# Patient Record
Sex: Male | Born: 1950
Health system: Southern US, Community
[De-identification: ages and names within clinical notes are randomized; demographics above are authoritative.]

## PROBLEM LIST (undated history)

## (undated) DIAGNOSIS — M549 Dorsalgia, unspecified: Secondary | ICD-10-CM

## (undated) DIAGNOSIS — E669 Obesity, unspecified: Secondary | ICD-10-CM

## (undated) DIAGNOSIS — M545 Low back pain, unspecified: Secondary | ICD-10-CM

## (undated) DIAGNOSIS — I10 Essential (primary) hypertension: Secondary | ICD-10-CM

## (undated) DIAGNOSIS — M169 Osteoarthritis of hip, unspecified: Secondary | ICD-10-CM

## (undated) DIAGNOSIS — N401 Enlarged prostate with lower urinary tract symptoms: Secondary | ICD-10-CM

## (undated) DIAGNOSIS — K635 Polyp of colon: Secondary | ICD-10-CM

## (undated) DIAGNOSIS — J61 Pneumoconiosis due to asbestos and other mineral fibers: Secondary | ICD-10-CM

## (undated) DIAGNOSIS — M25569 Pain in unspecified knee: Secondary | ICD-10-CM

## (undated) DIAGNOSIS — M1990 Unspecified osteoarthritis, unspecified site: Secondary | ICD-10-CM

## (undated) DIAGNOSIS — S8292XA Unspecified fracture of left lower leg, initial encounter for closed fracture: Secondary | ICD-10-CM

## (undated) DIAGNOSIS — E119 Type 2 diabetes mellitus without complications: Secondary | ICD-10-CM

## (undated) DIAGNOSIS — R351 Nocturia: Secondary | ICD-10-CM

## (undated) DIAGNOSIS — M1710 Unilateral primary osteoarthritis, unspecified knee: Secondary | ICD-10-CM

## (undated) DIAGNOSIS — M179 Osteoarthritis of knee, unspecified: Secondary | ICD-10-CM

## (undated) DIAGNOSIS — E785 Hyperlipidemia, unspecified: Secondary | ICD-10-CM

## (undated) HISTORY — DX: Pain in unspecified knee: M25.569

## (undated) HISTORY — DX: Dorsalgia, unspecified: M54.9

## (undated) HISTORY — DX: Osteoarthritis of knee, unspecified: M17.9

## (undated) HISTORY — DX: Low back pain: M54.5

## (undated) HISTORY — DX: Unilateral primary osteoarthritis, unspecified knee: M17.10

## (undated) HISTORY — DX: Hyperlipidemia, unspecified: E78.5

## (undated) HISTORY — DX: Low back pain, unspecified: M54.50

## (undated) HISTORY — DX: Osteoarthritis of hip, unspecified: M16.9

## (undated) HISTORY — DX: Unspecified osteoarthritis, unspecified site: M19.90

## (undated) HISTORY — DX: Pneumoconiosis due to asbestos and other mineral fibers: J61

## (undated) HISTORY — PX: TOTAL HIP ARTHROPLASTY: SHX124

## (undated) HISTORY — DX: Obesity, unspecified: E66.9

## (undated) HISTORY — DX: Essential (primary) hypertension: I10

## (undated) HISTORY — PX: JOINT REPLACEMENT: SHX530

## (undated) HISTORY — DX: Polyp of colon: K63.5

---

## 1973-09-25 DIAGNOSIS — S8292XA Unspecified fracture of left lower leg, initial encounter for closed fracture: Secondary | ICD-10-CM

## 1973-09-25 HISTORY — DX: Unspecified fracture of left lower leg, initial encounter for closed fracture: S82.92XA

## 1973-09-25 HISTORY — DX: Rider (driver) (passenger) of other motorcycle injured in unspecified traffic accident, initial encounter: V29.99XA

## 1988-09-25 HISTORY — PX: CHOLECYSTECTOMY OPEN: SUR202

## 1999-06-22 ENCOUNTER — Encounter: Admission: RE | Admit: 1999-06-22 | Discharge: 1999-07-05 | Payer: Self-pay

## 2000-03-26 ENCOUNTER — Ambulatory Visit (HOSPITAL_COMMUNITY): Admission: RE | Admit: 2000-03-26 | Discharge: 2000-03-26 | Payer: Self-pay | Admitting: Family Medicine

## 2000-03-26 ENCOUNTER — Encounter: Payer: Self-pay | Admitting: Family Medicine

## 2001-09-13 ENCOUNTER — Encounter (INDEPENDENT_AMBULATORY_CARE_PROVIDER_SITE_OTHER): Payer: Self-pay | Admitting: Specialist

## 2001-09-13 ENCOUNTER — Ambulatory Visit (HOSPITAL_COMMUNITY): Admission: RE | Admit: 2001-09-13 | Discharge: 2001-09-13 | Payer: Self-pay | Admitting: Gastroenterology

## 2003-04-04 ENCOUNTER — Ambulatory Visit (HOSPITAL_COMMUNITY): Admission: RE | Admit: 2003-04-04 | Discharge: 2003-04-04 | Payer: Self-pay | Admitting: Family Medicine

## 2003-04-04 ENCOUNTER — Encounter: Payer: Self-pay | Admitting: Family Medicine

## 2003-05-08 ENCOUNTER — Encounter: Payer: Self-pay | Admitting: Neurosurgery

## 2003-05-08 ENCOUNTER — Encounter: Payer: Self-pay | Admitting: Radiology

## 2003-05-08 ENCOUNTER — Encounter: Admission: RE | Admit: 2003-05-08 | Discharge: 2003-05-08 | Payer: Self-pay | Admitting: Neurosurgery

## 2003-05-22 ENCOUNTER — Encounter: Admission: RE | Admit: 2003-05-22 | Discharge: 2003-05-22 | Payer: Self-pay | Admitting: Orthopaedic Surgery

## 2003-05-22 ENCOUNTER — Encounter: Payer: Self-pay | Admitting: Orthopaedic Surgery

## 2003-06-08 ENCOUNTER — Encounter: Admission: RE | Admit: 2003-06-08 | Discharge: 2003-06-08 | Payer: Self-pay | Admitting: Orthopaedic Surgery

## 2003-06-08 ENCOUNTER — Encounter: Payer: Self-pay | Admitting: Orthopaedic Surgery

## 2003-10-29 ENCOUNTER — Inpatient Hospital Stay (HOSPITAL_COMMUNITY): Admission: RE | Admit: 2003-10-29 | Discharge: 2003-11-01 | Payer: Self-pay | Admitting: Orthopaedic Surgery

## 2004-05-19 ENCOUNTER — Encounter (INDEPENDENT_AMBULATORY_CARE_PROVIDER_SITE_OTHER): Payer: Self-pay | Admitting: *Deleted

## 2004-05-19 ENCOUNTER — Ambulatory Visit (HOSPITAL_COMMUNITY): Admission: RE | Admit: 2004-05-19 | Discharge: 2004-05-19 | Payer: Self-pay | Admitting: Gastroenterology

## 2007-08-25 ENCOUNTER — Encounter: Admission: RE | Admit: 2007-08-25 | Discharge: 2007-08-25 | Payer: Self-pay | Admitting: Orthopedic Surgery

## 2008-03-12 ENCOUNTER — Ambulatory Visit: Payer: Self-pay | Admitting: Cardiology

## 2008-03-24 ENCOUNTER — Ambulatory Visit: Payer: Self-pay

## 2008-04-25 DIAGNOSIS — K635 Polyp of colon: Secondary | ICD-10-CM

## 2008-04-25 HISTORY — DX: Polyp of colon: K63.5

## 2008-05-12 ENCOUNTER — Inpatient Hospital Stay (HOSPITAL_COMMUNITY): Admission: RE | Admit: 2008-05-12 | Discharge: 2008-05-15 | Payer: Self-pay | Admitting: Orthopaedic Surgery

## 2009-05-04 DIAGNOSIS — E1159 Type 2 diabetes mellitus with other circulatory complications: Secondary | ICD-10-CM | POA: Insufficient documentation

## 2009-05-04 DIAGNOSIS — E785 Hyperlipidemia, unspecified: Secondary | ICD-10-CM

## 2009-05-04 DIAGNOSIS — E1169 Type 2 diabetes mellitus with other specified complication: Secondary | ICD-10-CM

## 2009-05-04 DIAGNOSIS — I10 Essential (primary) hypertension: Secondary | ICD-10-CM

## 2011-02-07 NOTE — Op Note (Signed)
NAME:  OSMOND, STECKMAN NO.:  [id number]   MEDICAL RECORD NO.:  [id number]          PATIENT TYPE:  INP   LOCATION:  5013                         FACILITY:  [facility]   PHYSICIAN:  Claude Manges. Whitfield, M.D.DATE OF BIRTH:  1951/06/01   DATE OF PROCEDURE:  05/12/2008  DATE OF DISCHARGE:                               OPERATIVE REPORT   PREOPERATIVE DIAGNOSIS:  End-stage osteoarthritis, left hip.   POSTOPERATIVE DIAGNOSIS:  End-stage osteoarthritis, left hip.   PROCEDURE:  Left total hip replacement.   SURGEON:  Claude Manges. Cleophas Dunker, MD   ASSISTANTS:  1. Lenard Galloway. Chaney Malling, MD  2. Oris Drone Petrarca, PA-C   ANESTHESIA:  General orotracheal.   COMPLICATIONS:  None.   COMPONENTS:  DePuy AML large stature 13.5-mm femoral component, 58-mm  outer diameter, 100 series metallic acetabular cup with a +4  polyethylene liner with a 10-degree posterior lip, and 36-mm outer  diameter hip ball with a 5-mm neck length, all were Press-Fit.   PROCEDURE:  With the patient comfortable on the operating table and  under general orotracheal anesthesia, nursing staff inserted a Foley  catheter.  Urine was clear.  The patient was then placed in the lateral  decubitus position with the left side up and secured to the operating  room table with the Innomed hip system.   The left hip was then prepped with Betadine scrub and then DuraPrep from  iliac crest to the midcalf.  Sterile draping was performed.   A routine southern incision was utilized and via sharp dissection  carried down to subcutaneous tissue.  The patient weighed 270 pounds and  because of the size, the procedure was technically difficult.  I incised  abundant adipose tissue, inserted the self-retaining retractors, and  used the Bovie for coagulation.  The iliotibial band was identified and  incised along the length of the skin incision.   With the hip internally rotated, the short external rotators were  palpated.  There  were not visible because of the patient's size, but we  retracted the adipose tissue was then tagged the tendinous short  external rotators, incised them revealing the capsule, a capsule was  then incised along the femoral neck and head.  There was a small  serosanguineous joint effusion and abundant synovitis.  The head was  then easily dislocated posteriorly.  There were multiple areas that were  devoid of articular cartilage with the peripheral osteophytes.  Using  the calcar guide, an osteotomy was made below the femoral head along the  femoral neck.  Head was then removed from the wound.  The femoral neck  was then delivered into the wound.  A starter hole was then made in the  piriformis fossa followed by the canal finder.  Reaming was performed  sequentially to 13 mm to accept a 13.5-mm component.  This was the same  size that was templated preoperatively and also that was inserted on the  right side several years ago.  Rasping was then performed sequentially  to 13.5 large stature.  The level of a cut was about a fingerbreadth  proximal to the lesser trochanter.   Retractor was then placed around the acetabulum.  The labrum was sharply  excised.  Reaming was performed sequentially to 57-mm outer diameter.  We then trialed a 56 and a 58 trial component.  We thought we had  excellent rim fit with a 58 that would not completely seat.   Accordingly, the 100 series acetabular metallic component was then  impacted into the acetabulum followed by the trial polyethylene +4  component with a 10-degree posterior lip.  A 13.5-mm large stature rasp  was then reinserted and then we trialed several neck lengths and felt  that the +5 with a 36-mm hip ball, there was a perfect fit.  We had  excellent stability in both flexion and extension, and there was no  impingement.   Trial components were then removed.  The joint was copiously irrigated  with saline solution.  We had some bleeding from  the apex hole in the  acetabular component.  We irrigated this, packed it with Gelfoam, and  then inserted the apex hole eliminator.   The wound was again irrigated with saline solution.  The +4 polyethylene  liner was then impacted, and in the same position we inserted the trial.  The femoral canal was irrigated.  The 13.5-mm large stature femoral  component was then impacted onto the calcar in approximately 15-20  degrees of anteversion.  We then trialed a +5 neck length with the 36-mm  hip ball, we had perfect stability and no toggling.   The hip was then again reduced, was then dislocated.  The final 36-mm  outer diameter hip ball with a +5 neck length was then applied to the  cleansed Morris taper neck.  The entire hip was then reduced and again  through a full range of motion, we had excellent stability.  We thought  leg lengths were symmetrical.   The wound was again irrigated with saline solution.  Capsule was closed  with interrupted #1 Ethibond.  Short external rotators were closed with  a similar material.  The iliotibial band was closed with a running 0  Vicryl, subcu in several layers of 0 and 2-0 Vicryl, and the skin was  closed with skin clips.  Sterile bulky dressing was applied followed by  a knee immobilizer.  The patient was then placed supine on the operating  room stretcher and returned to the postanesthesia recovery room in  satisfactory condition.      Claude Manges. Cleophas Dunker, M.D.  Electronically Signed     PWW/MEDQ  D:  05/12/2008  T:  05/13/2008  Job:  [id number]
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
## 2011-02-10 NOTE — Discharge Summary (Signed)
NAME:  Adam Barrera, Adam Barrera                           ACCOUNT NO.:  [id number]   MEDICAL RECORD NO.:  [id number]                   PATIENT TYPE:  INP   LOCATION:  5035                                 FACILITY:  [facility]   PHYSICIAN:  Claude Manges. Cleophas Dunker, M.D.            DATE OF BIRTH:  Oct 09, 1950   DATE OF ADMISSION:  10/29/2003  DATE OF DISCHARGE:  11/01/2003                                 DISCHARGE SUMMARY   ADMISSION DIAGNOSES:  1. Osteoarthritis of hips right greater than left.  2. Hypertension.  3. Dyslipidemia.   DISCHARGE DIAGNOSES:  1. Status post right total hip arthroplasty.  2. Hypokalemia.  3. Dyslipidemia.  4. Hypertension.  5. Obesity.   HISTORY OF PRESENT ILLNESS:  Adam Barrera is a 60 year old African-American male  with bilateral hip pain right greater than left. Pain in the right hip has  been ongoing for at least the past three to four years. The pain has become  progressively worse, especially in the past few months. At this point in  time the patient is notable for hip pain, which makes it very hard for him  to sleep. The patient often wakes up throughout the night due to his hip  pain. The pain worse with steps and getting up from a sitting position. The  patient has had a cortisone injection in the right hip that gave him little  to no relief. He has tried Celebrex and Mobic for hip pain with no relief.  He currently takes Advil for the pain, which gives him a considerable amount  of relief. He describes the pain as constant, sharp, throbbing pain that is  getting worse. The patient uses no assistive devices to ambulate.   ALLERGIES:  No known drug allergies.   MEDICATIONS:  1. HCTZ 25 mg daily.  2. Aleve 220 mg two tabs daily.  3. Vitamin C 500 mg daily.  4. Flax seed oil 1000 mg q.i.d.  5. Omega 3/Omega 6 fish oil 1200 mg a day.  6. Lipitor 10 mg a day.   DESCRIPTION OF PROCEDURE:  On October 29, 2003 the patient was taken to the  operating room by Dr. [name] and assisted by Rinaldo Ratel, M.D.  and Arnoldo Morale, P.A. The patient was placed under general anesthesia and a  right total hip replacement was performed. The following components were  inserted P-prodigy 3.5 large stature femoral component, 36 mm outer diameter  femoral head with a 1.5 neck length, a 100 series Pinnacle acetabular cup 56  mm outer diameter with a +4 acetabular line, and then a Pex hole eliminator.  The patient tolerated the procedure well and returned to recovery in good  and stable condition.   CONSULTATIONS:  The following consults were obtained while the patient was  hospitalized:  1. Pharmacy.  2. Case management.  3. Physical therapy.  4. Occupational therapy.   HOSPITAL COURSE:  Postoperatively  the patient developed hypokalemia and was  treated with K-Dur. Otherwise the patient's vital signs remained stable  throughout the hospital stay. The patient remained afebrile and was  discharged home on postoperative day three in good stable condition.   LABORATORY DATA:  Routine labs on admission:  CBC white blood cell count  7.3, hemoglobin 15.6, hematocrit 45.2, platelets 194. Coags:  PT 13.2, INR  1.0, PTT 30. Routine chemistries:  Sodium 140, potassium 4.2, chloride 102,  bicarb 28, glucose 86, BUN 8, creatinine 1.0. Urinalysis on admission  negative. Hepatic enzymes on admission:  AST 24, ALT 27, ALP 79, total  bilirubin 0.9.   EKG dated October 26, 2003 showed normal sinus rhythm with a heart rate of  86 beats per minute, PR interval 172 milliseconds, PR T-axis 48, 41, 27.   Postoperative right hip showed the right total hip replacement to be in good  position and good alignment.   DISCHARGE MEDICATIONS:  The patient is to resume home medication and add the  following.  1. K-Dur 20 mg one tab daily for five days.  2. Percocet 5 mg 1-2 tablets q.4-6h. as needed for pain.  3. OxyContin 10 mg SR one tablet q.12h. p.r.n. pain.  4. Lovenox 40 mg  subcu q.24h. until the Coumadin is therapeutic.  5. Coumadin as directed by pharmacy, Adam Barrera to monitor Coumadin.   ACTIVITY:  Fifty percent partial weightbearing right leg with walker. Home  health PT, [facility].   CONDITION ON DISCHARGE:  The patient was discharged home in good and stable  condition.   DIET:  No restrictions.   WOUND CARE:  The patient should change the dressing daily. May shower after  two days of no drainage. The patient is to call our office if temperature is  greater than 100.5, swelling, foul smelling drainage, or pain that is not  controlled.   DISCHARGE FOLLOW UP:  The patient needs follow-up with Dr. Cleophas Dunker in the  office in approximately 10 days from discharge. The patient is to call  office at [phone] to make a follow-up appointment.      Richardean Canal, P.A.                       Claude Manges. Cleophas Dunker, M.D.    GC/MEDQ  D:  11/24/2003  T:  11/24/2003  Job:  [id number]

## 2011-02-10 NOTE — H&P (Signed)
NAME:  Adam Barrera, Adam Barrera                          ACCOUNT NO.:  [id number]   MEDICAL RECORD NO.:  [id number]                   PATIENT TYPE:  INP   LOCATION:                                       FACILITY:  [facility]   PHYSICIAN:  Claude Manges. Cleophas Dunker, M.D.            DATE OF BIRTH:  1951-07-10   DATE OF ADMISSION:  10/29/2003  DATE OF DISCHARGE:                                HISTORY & PHYSICAL   CHIEF COMPLAINT:  Right hip pain.   HISTORY OF PRESENT ILLNESS:  The patient is a 60 year old African-American  male with bilateral hip pain, right greater than left.  Pain in the right  hip and ongoing for at least the past three or four years.  The pain has  become worse over the past few months.  He is now to the point where he is  miserable with right hip pain, and it makes it very hard for him to sleep.  In fact, he often wakes up throughout the night, due to hip pain.  The pain  is worse with steps and with getting up from a sitting position.  The  patient has had a cortisone injection into the right hip, which gave him  relief for about one week.  He has tried Celebrex and Mobic, with no relief.  He takes Aleve for the pain, and this gives him a considerable amount of  relief.  He describes his pain as constant and sharp, a throbbing pain that  is getting worse.  He has no assistive devices to ambulate.   ALLERGIES:  No known drug allergies.   MEDICATIONS:  1. HCTZ 25 mg daily.  2. Aleve 220 mg, two tab daily.  3. Vitamin C 500 mg daily.  4. Flax seed oil 1000 mg, four tab daily.  5. Omega 3/Omega 6 fish oil 1200 mg daily.  6. Lipitor 10 mg daily.   PAST MEDICAL HISTORY:  1. Positive for dyslipidemia.  2. Hypertension.  3. Obesity.   PAST SURGICAL HISTORY:  Cholecystectomy.   SOCIAL HISTORY:  The patient has smoked 20-pack-years.  Recently stopped  smoking two weeks ago.  Denies any alcohol use.  The patient is married.  The patient works as a Psychologist, occupational for [employer].  He is  currently employed.   PRIMARY CARE PHYSICIAN:  [facility] in [place],  [place], phone #[phone].   FAMILY HISTORY:  Mother deceased at age 52.  She had a history of diabetes  mellitus and Alzheimer's disease.  Father living, age 61, has some prostate  problems, questionably benign prostatic hypertrophy.  The family history  otherwise remarkable for intestinal cancer.   REVIEW OF SYSTEMS:  The patient denies any recent colds, cough, fever, or  flu-like symptoms.  Denies any shortness of breath, PND, orthopnea, or chest  pain.  He has both upper and lower dentures.  He has occasional reflux.  He  takes an over-the-counter antacid.  Otherwise the review of the GI systems  is negative.  Has nocturia x1 and increased frequency that has been ongoing  for years.  Denies any hematologic or neurologic symptoms.   PHYSICAL EXAMINATION:  GENERAL:  The patient is a well-developed obese male,  who walks with an antalgic gait.  The patient's right foot externally  rotates when he walks.  The patient's mood and affect are appropriate.  He  talks easily with the examiner.  VITAL SIGNS:  Height 5 feet 10 inches, weight 288 pounds, BMI calculated at  41.3.  Temperature 97.1 degrees, blood pressure 132/88, pulse 78,  respirations 16.  HEART:  A regular rate and rhythm, no murmurs, rubs, or gallops noted.  LUNGS:  Clear to auscultation bilaterally.  No wheezing or rhonchi.  No  rales noted.  ABDOMEN:  Soft, obese, nontender to palpation.  Bowel sounds x4 quadrants.  There is a surgical incision scar from his past cholecystectomy, in the  right upper quadrant, which is well-healed.  NECK:  Trachea is midline.  No lymphadenopathy.  Carotids 2+ bilaterally and  without bruits.  No new cervical tenderness to palpation.  A full range of  motion of the cervical spine.  BACK:  Nontender to palpation over the thoracic and lumbar spine.  GENITOURINARY:  Deferred at this  time.  RECTAL:  Deferred at this time.  NEUROLOGIC:  The patient is alert and oriented x3.  Cranial nerves II-XII  are grossly intact.  Deep tendon reflexes are equal in the upper and lower  extremities bilaterally.  The patient has 5/5 strength against resistance in  the upper and lower extremities.  MUSCULOSKELETAL:  Upper extremities are equal in size and shape bilaterally.  The patient has a full range of motion of the shoulders, elbows, wrists and  the hands bilaterally.  Left index distal phalanx is amputated.  Radial  pulses are 2+ bilaterally.  BILATERAL HIPS:  Right hip has virtually no  internal rotation.  Externally rotates 35-45 degrees.  Left hip internal  rotation 20-25 degrees.  External rotation is full.  Flexion of both hips  causes pain.  Right hip externally rotates 10 degrees when the patient is at  rest.  The right knee has 0-125 to 130 degrees flexion.  No tenderness to  palpation along the joint line.  On valgus and varus stressing reveals no  laxity.  No edema is noted.  The left knee has 0-110 to 115 degrees with  flexion.  Passive range of motion reveals some crepitus.  No tenderness with  palpation along the joint line.  Valgus and varus stressing reveals no  laxity.  The lower extremities have no pitting edema noted.  Dorsal pedal  pulses are 2+ bilaterally.   X-RAYS:  The x-rays of the bilateral hips shows severe osteoarthritis, right  worse than left.   IMPRESSION:  1. Osteoarthritis, bilateral hips, right greater than left.  2. Hypertension.  3. Dyslipidemia.  4. Obesity.   PLAN:  The patient is to be admitted to [facility] on  October 29, 2003, and undergo a right total hip by Dr. Claude Manges. Whitfield.  The patient is to undergo all preoperative labs and testing prior to  surgery.  The patient is to receive a full clearance from [facility] [place] prior to [place].  The patient's  primary  care Dushawn Pusey has been notified, and they will fax over clearance  prior  to the surgery.     Richardean Canal, P.A.                       Claude Manges. Cleophas Dunker, M.D.   GC/MEDQ  D:  10/20/2003  T:  10/20/2003  Job:  [id number]

## 2011-02-10 NOTE — Discharge Summary (Signed)
NAME:  WITT, PLITT NO.:  [id number]   MEDICAL RECORD NO.:  [id number]          PATIENT TYPE:  INP   LOCATION:  5013                         FACILITY:  [facility]   PHYSICIAN:  Claude Manges. Whitfield, M.D.DATE OF BIRTH:  [birth year]   DATE OF ADMISSION:  05/12/2008  DATE OF DISCHARGE:  05/15/2008                               DISCHARGE SUMMARY   ADMISSION DIAGNOSIS:  Osteoarthritis of the left hip.   DISCHARGE DIAGNOSES:  1. Osteoarthritis of the left hip.  2. Status post right total hip arthroplasty.  3. Hypertension.  4. Obesity.  5. Cigarette smoker.  6. Post hemorrhagic anemia.  7. Gastroesophageal reflux disease.  8. Smoker.   PROCEDURE:  Left total hip arthroplasty.   HISTORY:  Adam Barrera is a 60 year old African American male with  chronic left hip pain which is worsening.  He has had a right total hip  arthroplasty in the past with good results.  Now having pain with every  step and nighttime pain.  He is unable to perform ADLs.  Radiographic  end-stage osteoarthritis left hip.  Indicated for left total hip  arthroplasty.   HOSPITAL COURSE:  A 60 year old African American male admitted May 12, 2008, after appropriate laboratory studies were obtained as well as  2 g of Ancef IV on-call the operating room.  He was taken to the  operating room where he underwent a left total hip arthroplasty.  He  tolerated the procedure well.  Continued on Ancef 1 g IV q.6 h x3 doses.  He was started on her Lovenox 30 mg subcu q.12 hours.  Coumadin was  started as per protocol.  Foley was placed intraoperatively.  He was  placed into a knee immobilizer postoperatively to prevent dislocation.  Consults PT, OT care management were made.  Weightbearing as tolerated  on the left.  A Dilaudid PCA pump was used in full dose.  He was allowed  out of bed to chair the following day.  He is weaned off of his PCA.  Incentive spirometry q. 1 hour while awake was ordered.  Postop  day #2,  his dressing was changed.  His wound remained benign.  He was then  discharged with unremarkable hospital course on May 15, 2008.   RADIOGRAPHIC STUDIES:  Portable pelvis May 12, 2008 reveals left  total hip arthroplasty with anatomical alignment.   LABORATORY STUDIES:  Hemoglobin of 14.9, hematocrit 43.4%, white count  6200, platelets 186,000.  Discharge hemoglobin 8.8, hematocrit 26.0%,  white count 8500, and platelets 152,000.  Preop protime 12.9, INR 1.0,  and PTT 28.  Discharge protime 22.1, INR 1.8.  Preop sodium 141,  potassium 4.6, chloride 106, CO2 28, glucose 106, BUN 10, and creatinine  1.18.  Discharge sodium 135, potassium 4.1, chloride was 103, CO2 26,  glucose 105, BUN 11, and creatinine 1.12.  GFR greater than 60  preoperatively and also at discharge.  Preop total protein 6.7, albumin  3.9, AST 20, ALT 26, ALP 73, total bilirubin 1.0.  Urinalysis benign for  voided urine.  Blood type was O+, antibody screen  negative.  Urine  culture showed no growth.   DISCHARGE INSTRUCTIONS:  No restriction on diet.  Keep the incision  clean and dry and change the dressing daily.  Uses walker, weightbearing  as tolerated.  Follow with hip precautions.  May shower on Saturday.  No  lifting or driving for 6 weeks.  Prescription for Percocet 5/325 one to  two tabs every 4 hours as needed for pain.  Robaxin 500 mg 1 tablet  every 6 hours as needed for spasms.  Coumadin 5 mg directed by [facility].  He was started on 5 mg, Tuesday, Thursday and Saturday, 7.5  mg Monday, Wednesday, and Friday.  Blood to be drawn on May 18, 2008.  Follow up with Dr. Cleophas Dunker on May 25, 2008 and he will call for  appointment.  Discharged in improved condition.      Oris Drone Petrarca, P.A.-C.      Claude Manges. Cleophas Dunker, M.D.  Electronically Signed    BDP/MEDQ  D:  06/16/2008  T:  06/17/2008  Job:  [id number]

## 2011-02-10 NOTE — Op Note (Signed)
NAME:  Adam Barrera, Adam Barrera                           ACCOUNT NO.:  [id number]   MEDICAL RECORD NO.:  [id number]                   PATIENT TYPE:  AMB   LOCATION:  ENDO                                 FACILITY:  [facility]   PHYSICIAN:  Petra Kuba, M.D.                 DATE OF BIRTH:  03/26/1951   DATE OF PROCEDURE:  05/19/2004  DATE OF DISCHARGE:                                 OPERATIVE REPORT   PROCEDURE:  Colonoscopy.   INDICATIONS FOR PROCEDURE:  Family history of colon cancer, bright red blood  per rectum.  Consent was signed after risks, benefits, methods, and options  were thoroughly discussed in the office.   MEDICATIONS USED:  Demerol 60, Versed 6.   PROCEDURE:  Rectal inspection was pertinent for external hemorrhoids.  Digital exam was negative.  The video colonoscope was inserted and easily  advanced from the colon to the cecum.  This did require rolling him on his  back and abdominal pressure.  No obvious abnormality was seen on insertion  except for an occasional left sided diverticula.  No signs of bleeding were  seen.  The cecum was identified by the appendiceal orifice and the ileocecal  valve.  There was some stool adherent to the wall of the right colon which  could not completely be washed off.  The rest of the prep was adequate.  On  slow withdrawal through the colon, opposite the ileocecal valve, a possible  polyp was seen which was cold biopsied x 3.  The scope was then further  withdrawn, no other polypoid lesions were seen.  As we slowly withdrew back  to the rectum, the occasional left sided diverticula were confirmed.  Anorectal pull through and retroflexion did confirm some small hemorrhoids,  the most likely cause of bleeding.  The scope was then readvanced a short  ways up the left side of the colon, air was suctioned, scope removed.  Approximately 1 liter of fluid was used for washing and suctioning for  adequate visualization.  The patient tolerated the  procedure well.  There  was no obvious immediate complications.   ENDOSCOPIC DIAGNOSIS:  1. Internal and external hemorrhoids.  2. Occasional left sided diverticula.  3. Ascending small polyp, cold biopsied.  4. Otherwise, within normal limits to the cecum.   PLAN:  Await pathology.  As long as this is not an adenoma, recheck colon  screening in five years.  Happy to see back p.r.n.  Otherwise return care to  Dr. Christell Constant in his office for the customary health care maintenance to include  yearly rectals and guaiacs.                                               Petra Kuba, M.D.  MEM/MEDQ  D:  05/19/2004  T:  05/19/2004  Job:  [id number]   cc:   Ernestina Penna, M.D.  [address] [place]  [place] [address]  Fax: [phone]

## 2011-02-10 NOTE — Procedures (Signed)
[facility]  Patient:    Adam Barrera, Adam Barrera Visit Number: [id number] MRN: [id number]          Service Type: END Location: ENDO Attending Physician:  Nelda Marseille Dictated by:   Petra Kuba, M.D. Proc. Date: 09/13/01 Admit Date:  09/13/2001   CC:         Monica Becton, M.D.   Procedure Report  PROCEDURE:  Colonoscopy with polypectomy.  INDICATIONS FOR PROCEDURE:  Bright red blood per rectum in a patient with a brother with colon cancer. Consent was signed after risks, benefits, methods, and options were thoroughly discussed in the office.  MEDICINES USED:  Demerol 80, Versed 8.  DESCRIPTION OF PROCEDURE:  Rectal inspection was pertinent for external hemorrhoids. Digital exam was negative. The video colonoscope was inserted, fairly easily advanced around the colon to the cecum. On insertion, some left sided diverticula with some inflammation was seen. To advance in the cecum required rolling him on his back and some abdominal pressure. No other abnormalities were seen. The cecum was identified by the appendiceal orifice and the ileocecal valve. The scope was then slowly withdrawn. The prep was adequate. There was some liquid stool that required washing and suctioning. In the mid ascending, two small polyps were seen, both hot biopsied and put in the same container. The scope was slowly withdrawn back to the left side of the colon where in the proximal sigmoid and distal descending, some diverticula and inflammation were seen. In the more proximal rectum, two tiny polyps were seen and were each hot biopsied x 1 and put in the second container. No additional findings were seen as we slowly withdrew back to the rectum. The scope was then retroflexed pertinent for some internal hemorrhoids. The scope was straightened and a anal rectal pull-through confirmed the hemorrhoids with some small tears. The scope was reinserted a short ways up the  left side of the colon, air was suctioned, the scope removed. The patient tolerated the procedure well. There was no obvious or immediate complication.  ENDOSCOPIC DIAGNOSIS:  1. Internal/external hemorrhoids with tears.  2. Left sided tics and inflammation.  3. Four tiny polyps hot biopsied, two in the proximal rectum and two in the     ascending.  4. Otherwise within normal limits to the cecum.  PLAN:  Await pathology to determine future colonic screening. GI follow-up p.r.n. Otherwise return care to Dr. Christell Constant to treat hemorrhoids with either creams or suppositories and yearly rectals and guaiacs. Happy to see back p.r.n. Dictated by:   Petra Kuba, M.D. Attending Physician:  Nelda Marseille DD:  09/13/01 TD:  09/14/01 Job: [id number]

## 2011-02-10 NOTE — Op Note (Signed)
NAME:  Adam Barrera, Adam Barrera                           ACCOUNT NO.:  [id number]   MEDICAL RECORD NO.:  [id number]                   PATIENT TYPE:  INP   LOCATION:  2899                                 FACILITY:  [facility]   PHYSICIAN:  Claude Manges. Cleophas Dunker, M.D.            DATE OF BIRTH:  Oct 16, 1950   DATE OF PROCEDURE:  10/29/2003  DATE OF DISCHARGE:                                 OPERATIVE REPORT   PREOPERATIVE DIAGNOSIS:  End stage osteoarthritis, right hip.   POSTOPERATIVE DIAGNOSIS:  End stage osteoarthritis, right hip.   PROCEDURE:  Right total hip replacement.   SURGEON:  Claude Manges. Cleophas Dunker, M.D.   ASSISTANT:  Thereasa Distance A. Chaney Malling, M.D.  Legrand Pitts Duffy, P.A.-C.   ANESTHESIA:  General orotracheal.   COMPLICATIONS:  None.   COMPONENTS:  DePuy Prodigy 13.5 large stature femoral component, 36 mm outer  diameter femoral head with a 1.5 mm neck length, a 100 series pinnacle  acetabular cup 56 mm outer diameter with a +4 acetabular line and an apex  hole eliminator.   PROCEDURE:  With the patient comfortable on the operating table and under  general orotracheal anesthesia, the patient was placed in the lateral  decubitus position with the right side up, the right side had been marked  preoperatively as the appropriate side.  The patient was secured to the  operating table with the Innomed Hip System.  The right hip was then prepped  with DuraPrep from the iliac crest to below the knee, sterile draping was  performed.  A routine Southern incision was utilized and via sharp  dissection carried down to the subcutaneous tissue.  The patient was quite  large with a lot of adipost tissue which made the procedure somewhat  technically difficult.  The adipost was incised with the Bovie.  Self-  retaining retractors were inserted.  The iliotibial band was eventually  encountered and incised along the length of the incision.  The patient had  limited internal rotation so, with some difficulty, we  were able to locate  the short external rotators.  These were tagged with suture and then incised  from the posterior aspect of the greater trochanter.  The capsule was  identified and incised along the femoral neck and head.  The hip was then  dislocated.  The femoral head was malformed with numerous osteophytes and  multiple areas of articular cartilage absence.  The head was then  osteotomized about a fingerbreadth proximal to the lesser trochanter using  the calcar guide.  The head was then removed.   Retractors were inserted.  The greater trochanteric notch was identified and  a starter hole made.  A canal finder was inserted and reaming was performed  to 13 mm to accept a 13.5 mm prosthesis.  We sequentially rasped up to 13.5  modified aspect and then went back to a 12 standard and then a 13.5 standard  which  fit very nicely on the calcar.  Calcar reamer was used to obtain final  position.  The retractor was then placed about the acetabulum.  The labrum  was sharply excised.  Reaming was performed to 55 mm to accept a 56 mm  prosthesis.  We trialed a 54 and a 56, we had nice rim fit but would not  completely seat on the 56.  Accordingly, the 100 series 56 mm outer diameter  acetabular component was then impacted using the acetabular guide.  We  trialed several acetabular sizes and felt the +4 with the 36 inner diameter  was the most stable.  We reinserted the rasp and used a Prodigy with a +1.5  mm neck length.  This construct provided the most stability.  We felt the  leg lengths were symmetrical without instability.   The wound was copiously irrigated with saline antibiotic solution throughout  the operative procedure.  The trial components were removed.  The apex hole  eliminator was inserted followed by the +4 acetabular line with the 36 mm  inner diameter to accept the 36 mm hip ball.  The Prodigy hip stem was then  impacted flush on the calcar.  We trialed a 36 mm ball with a  1.5 mm neck  length.  This was reduced with perfect stability.  Accordingly, the trial  head was removed, the Morris taper neck was cleaned, and the final 36 mm 1.5  mm neck length hip ball was then impacted.  The hip was then reduced, again  with a full range of motion we had excellent stability and we felt leg  lengths were symmetrical.  The wound was again irrigated with saline  antibiotic solution.  The capsule was closed with #1 Ethilon, the short  external rotators were closed with the same material.  The iliotibial band  was closed with running 0 Vicryl and the subcu closed with multiple layers  of 0 and 2-0 Vicryl.  The skin was closed with skin clips.  A sterile bulky  dressing was applied.  The patient was replaced supine on the stretcher and  returned to the post anesthesia recovery room in satisfactory condition.                                               Claude Manges. Cleophas Dunker, M.D.    PWW/MEDQ  D:  10/29/2003  T:  10/29/2003  Job:  [id number]

## 2011-02-27 ENCOUNTER — Encounter: Payer: Self-pay | Admitting: Physician Assistant

## 2011-02-28 ENCOUNTER — Encounter: Payer: Self-pay | Admitting: Physician Assistant

## 2011-06-23 LAB — URINALYSIS, ROUTINE W REFLEX MICROSCOPIC
Bilirubin Urine: NEGATIVE
Glucose, UA: NEGATIVE
Hgb urine dipstick: NEGATIVE
Protein, ur: NEGATIVE

## 2011-06-23 LAB — CROSSMATCH

## 2011-06-23 LAB — DIFFERENTIAL
Basophils Absolute: 0
Basophils Relative: 0
Eosinophils Absolute: 0.4
Eosinophils Relative: 7 — ABNORMAL HIGH
Lymphs Abs: 1.4

## 2011-06-23 LAB — URINE CULTURE: Culture: NO GROWTH

## 2011-06-23 LAB — PROTIME-INR: Prothrombin Time: 12.9

## 2011-06-23 LAB — COMPREHENSIVE METABOLIC PANEL
ALT: 26
AST: 20
Alkaline Phosphatase: 73
CO2: 28
Chloride: 106
GFR calc Af Amer: >60
GFR calc non Af Amer: >60
Potassium: 4.6
Sodium: 141
Total Bilirubin: 1

## 2011-06-23 LAB — CBC
MCV: 93.2
Platelets: 186
WBC: 6.2

## 2012-01-17 DIAGNOSIS — M1610 Unilateral primary osteoarthritis, unspecified hip: Secondary | ICD-10-CM | POA: Diagnosis not present

## 2012-01-17 DIAGNOSIS — M1710 Unilateral primary osteoarthritis, unspecified knee: Secondary | ICD-10-CM | POA: Diagnosis not present

## 2012-01-17 DIAGNOSIS — M25519 Pain in unspecified shoulder: Secondary | ICD-10-CM | POA: Diagnosis not present

## 2012-02-06 ENCOUNTER — Ambulatory Visit (INDEPENDENT_AMBULATORY_CARE_PROVIDER_SITE_OTHER): Payer: Worker's Compensation | Admitting: Internal Medicine

## 2012-02-06 ENCOUNTER — Encounter: Payer: Self-pay | Admitting: Internal Medicine

## 2012-02-06 VITALS — BP 122/82 | HR 78 | Temp 98.0°F | Ht 70.5 in | Wt 302.0 lb

## 2012-02-06 DIAGNOSIS — F17200 Nicotine dependence, unspecified, uncomplicated: Secondary | ICD-10-CM

## 2012-02-06 DIAGNOSIS — J61 Pneumoconiosis due to asbestos and other mineral fibers: Secondary | ICD-10-CM

## 2012-02-06 DIAGNOSIS — Z77090 Contact with and (suspected) exposure to asbestos: Secondary | ICD-10-CM | POA: Insufficient documentation

## 2012-02-06 NOTE — Progress Notes (Signed)
  Subjective:    Patient ID: Adam Barrera, male    DOB: 01/19/1951  MRN: [id number]  HPI  81 yobm active smoker exposed to Asbestos at [employer] getting f/u at [facility] with dx of asbestosis reported but desired f/u closer to home.   02/06/2012 1st pulmonary eval cc indolent onset x 5 years doe x heavy exertion(yardwork, up hills) no longer working with baseline  wt = upper 200's more limited by knees than sob. No sign daytime cough.  Sleeping ok without nocturnal  or early am exacerbation  of respiratory  c/o's or need for noct saba. Also denies any obvious fluctuation of symptoms with weather or environmental changes or other aggravating or alleviating factors except as outlined above   Review of Systems  Constitutional: Negative for fever, chills, activity change, appetite change and unexpected weight change.  HENT: Negative for congestion, sore throat, rhinorrhea, sneezing, trouble swallowing, dental problem, voice change and postnasal drip.   Eyes: Negative for visual disturbance.  Respiratory: Positive for shortness of breath. Negative for cough and choking.   Cardiovascular: Negative for chest pain and leg swelling.  Gastrointestinal: Negative for nausea, vomiting and abdominal pain.  Genitourinary: Negative for difficulty urinating.  Musculoskeletal: Negative for arthralgias.  Skin: Negative for rash.  Psychiatric/Behavioral: Negative for behavioral problems and confusion.       Objective:   Physical Exam  amb obese bm nad     Wt 302  HEENT: edentulous with dentures in place, turbinates, and orophanx. Nl external ear canals without cough reflex   NECK :  without JVD/Nodes/TM/ nl carotid upstrokes bilaterally   LUNGS: no acc muscle use, clear to A and P bilaterally without cough on insp or exp maneuvers   CV:  RRR  no s3 or murmur or increase in P2, no edema   ABD:  soft and nontender with nl excursion in the supine position. No bruits or organomegaly, bowel  sounds nl  MS:  warm without deformities, calf tenderness, cyanosis or clubbing  SKIN: warm and dry without lesions    NEURO:  alert, approp, no deficits         Assessment & Plan:

## 2012-02-06 NOTE — Patient Instructions (Signed)
Please see patient coordinator before you leave today  to schedule CT chest in 6 weeks and office visit same day with pft's   The key is to stop smoking completely before smoking completely stops you!

## 2012-02-08 DIAGNOSIS — Z87891 Personal history of nicotine dependence: Secondary | ICD-10-CM | POA: Insufficient documentation

## 2012-02-08 NOTE — Assessment & Plan Note (Signed)
Needs to complete the w/u with ct chest and pft's  In meantime strongly rec he stop smoking (discussed separately) in view of the huge risk of lung ca that is amplified by the asbestos content in lungs for which there is no treatment and therefore not under his control

## 2012-02-08 NOTE — Assessment & Plan Note (Signed)
Needs pft's to exclude element of smoking induced airways dz. In meantime I reviewed the Flethcher curve with patient that basically indicates  if you quit smoking when your best day FEV1 is still well preserved (which I believe his will prove to be)it is highly unlikely you will progress to severe disease and informed the patient there was no medication on the market that has proven to change the curve or the likelihood of progression.  Therefore stopping smoking and maintaining abstinence is the most important aspect of care, not choice of inhalers or for that matter, doctors.

## 2012-03-27 ENCOUNTER — Ambulatory Visit (INDEPENDENT_AMBULATORY_CARE_PROVIDER_SITE_OTHER): Payer: Worker's Compensation | Admitting: Internal Medicine

## 2012-03-27 ENCOUNTER — Ambulatory Visit (INDEPENDENT_AMBULATORY_CARE_PROVIDER_SITE_OTHER)
Admission: RE | Admit: 2012-03-27 | Discharge: 2012-03-27 | Disposition: A | Discharge status: Home / Self Care | Payer: Worker's Compensation | Source: Ambulatory Visit | Attending: Internal Medicine | Admitting: Internal Medicine

## 2012-03-27 ENCOUNTER — Encounter: Payer: Self-pay | Admitting: Internal Medicine

## 2012-03-27 VITALS — BP 118/70 | HR 83 | Temp 97.8°F | Ht 70.0 in | Wt 302.4 lb

## 2012-03-27 DIAGNOSIS — J61 Pneumoconiosis due to asbestos and other mineral fibers: Secondary | ICD-10-CM

## 2012-03-27 DIAGNOSIS — R0600 Dyspnea, unspecified: Secondary | ICD-10-CM | POA: Insufficient documentation

## 2012-03-27 DIAGNOSIS — R0989 Other specified symptoms and signs involving the circulatory and respiratory systems: Secondary | ICD-10-CM

## 2012-03-27 LAB — PULMONARY FUNCTION TEST

## 2012-03-27 NOTE — Assessment & Plan Note (Signed)
Ct reviewed with pt.  Although there are clearly abnormalities on CT scan, they should probably be considered "microscopic" since not likely to be seen on a plain cxr or of any physiologic study like pft's but do require f/u.    Discussed in detail all the  indications, usual  risks and alternatives  relative to the benefits with patient who agrees to proceed with continued serial ct chest yearly as that is all that is paid for by worker's comp and he's concerned about excess RT anyway if done more often.  Given the multi locality of the abnormalities on ct this seems reasonable.

## 2012-03-27 NOTE — Progress Notes (Signed)
  Subjective:    Patient ID: Adam Barrera, male    DOB: 08-23-1951  MRN: [id number]  HPI  27 yobm active smoker exposed to Asbestos at [employer] getting f/u at [facility] with dx of asbestosis reported but desired f/u closer to home.   02/06/2012 1st pulmonary eval cc indolent onset x 5 years doe x heavy exertion(yardwork, up hills) no longer working with baseline  wt = upper 200's more limited by knees than sob. No sign daytime cough. rec Please see patient coordinator before you leave today  to schedule CT chest in 6 weeks and office visit same day with pft's  The key is to stop smoking completely before smoking completely stops you!     03/27/2012 f/u ov/Lilou Kneip cc no change doe, really more limited by breathing.   No unusual cough, purulent sputum or sinus/hb symptoms on present rx.   No obvious daytime variabilty or assoc chronic cough or cp or chest tightness, subjective wheeze.    Sleeping ok without nocturnal  or early am exacerbation  of respiratory  c/o's or need for noct saba. Also denies any obvious fluctuation of symptoms with weather or environmental changes or other aggravating or alleviating factors except as outlined above   ROS  The following are not active complaints unless bolded sore throat, dysphagia, dental problems, itching, sneezing,  nasal congestion or excess/ purulent secretions, ear ache,   fever, chills, sweats, unintended wt loss, pleuritic or exertional cp, hemoptysis,  orthopnea pnd or leg swelling, presyncope, palpitations, heartburn, abdominal pain, anorexia, nausea, vomiting, diarrhea  or change in bowel or urinary habits, change in stools or urine, dysuria,hematuria,  rash, arthralgias, visual complaints, headache, numbness weakness or ataxia or problems with walking or coordination,  change in mood/affect or memory.             Objective:   Physical Exam  amb obese bm nad     Wt 302 02/06/12 > 03/27/2012  302  HEENT: edentulous with dentures in  place, turbinates, and orophanx. Nl external ear canals without cough reflex   NECK :  without JVD/Nodes/TM/ nl carotid upstrokes bilaterally   LUNGS: no acc muscle use, clear to A and P bilaterally without cough on insp or exp maneuvers   CV:  RRR  no s3 or murmur or increase in P2, no edema   ABD:  soft and nontender with nl excursion in the supine position. No bruits or organomegaly, bowel sounds nl  MS:  warm without deformities, calf tenderness, cyanosis or clubbing    Chest CT  03/27/2012 : 1. No evidence of asbestos related pleural disease or asbestosis.  2. Upper lobe predominant peribronchovascular ground-glass is  likely infectious or inflammatory in etiology.  3. Scattered pulmonary nodules measure up to 8 mm in size        Assessment & Plan:

## 2012-03-27 NOTE — Patient Instructions (Addendum)
The key is to stop smoking completely before smoking completely stops you!   Follow up will be arranged in one year with CT Chest - call sooner if worse short of breath or cough.

## 2012-03-27 NOTE — Progress Notes (Signed)
PFT done today. 

## 2012-03-27 NOTE — Assessment & Plan Note (Signed)
-   PFT's wnl x ERV 17% 03/27/2012   This is typical of effects of obesity on the diaphragm, reviewed need to control wt with pt in detail

## 2012-04-25 DIAGNOSIS — R5383 Other fatigue: Secondary | ICD-10-CM | POA: Diagnosis not present

## 2012-04-25 DIAGNOSIS — E785 Hyperlipidemia, unspecified: Secondary | ICD-10-CM | POA: Diagnosis not present

## 2012-04-25 DIAGNOSIS — E559 Vitamin D deficiency, unspecified: Secondary | ICD-10-CM | POA: Diagnosis not present

## 2012-04-25 DIAGNOSIS — I10 Essential (primary) hypertension: Secondary | ICD-10-CM | POA: Diagnosis not present

## 2012-04-25 DIAGNOSIS — R5381 Other malaise: Secondary | ICD-10-CM | POA: Diagnosis not present

## 2012-11-19 DIAGNOSIS — Z01818 Encounter for other preprocedural examination: Secondary | ICD-10-CM | POA: Diagnosis not present

## 2012-12-30 ENCOUNTER — Encounter (HOSPITAL_COMMUNITY): Payer: Self-pay | Admitting: Pharmacy Technician

## 2013-01-06 NOTE — Pre-Procedure Instructions (Signed)
Adam Barrera  01/06/2013   Your procedure is scheduled on:  Tuesday, April 22nd.  Report to [facility] at 5:30 AM.  Call this number if you have problems the morning of surgery: [phone]   Remember:   Do not eat food or drink liquids after midnight.Monday night   Take these medicines the morning of surgery with A SIP OF WATER: none   Do not wear jewelry, make-up or nail polish.  Do not wear lotions, powders, perfumes or deodorant.   Men may shave face and neck.  Do not bring valuables to the hospital.  Contacts, dentures or bridgework may not be worn into surgery.  Leave suitcase in the car. After surgery it may be brought to your room.  For patients admitted to the hospital, checkout time is 11:00 AM the day of  discharge.     Special Instructions: Shower using CHG 2 nights before surgery and the night before surgery.  If you shower the day of surgery use CHG.  Use special wash - you have one bottle of CHG for all showers.  You should use approximately 1/3 of the bottle for each shower.   Please read over the following fact sheets that you were given: Pain Booklet, Coughing and Deep Breathing and Surgical Site Infection Prevention

## 2013-01-07 ENCOUNTER — Encounter (HOSPITAL_COMMUNITY): Payer: Self-pay

## 2013-01-07 ENCOUNTER — Encounter (HOSPITAL_COMMUNITY)
Admission: RE | Admit: 2013-01-07 | Discharge: 2013-01-07 | Disposition: A | Discharge status: Home / Self Care | Payer: Medicare Other | Source: Ambulatory Visit | Attending: Orthopaedic Surgery | Admitting: Orthopaedic Surgery

## 2013-01-07 ENCOUNTER — Encounter (HOSPITAL_COMMUNITY)
Admission: RE | Admit: 2013-01-07 | Discharge: 2013-01-07 | Disposition: A | Discharge status: Home / Self Care | Payer: Medicare Other | Source: Ambulatory Visit | Attending: Orthopedic Surgery | Admitting: Orthopedic Surgery

## 2013-01-07 DIAGNOSIS — D62 Acute posthemorrhagic anemia: Secondary | ICD-10-CM | POA: Diagnosis not present

## 2013-01-07 DIAGNOSIS — N3289 Other specified disorders of bladder: Secondary | ICD-10-CM | POA: Diagnosis present

## 2013-01-07 DIAGNOSIS — E785 Hyperlipidemia, unspecified: Secondary | ICD-10-CM | POA: Diagnosis present

## 2013-01-07 DIAGNOSIS — R7309 Other abnormal glucose: Secondary | ICD-10-CM | POA: Diagnosis present

## 2013-01-07 DIAGNOSIS — J42 Unspecified chronic bronchitis: Secondary | ICD-10-CM | POA: Diagnosis not present

## 2013-01-07 DIAGNOSIS — M549 Dorsalgia, unspecified: Secondary | ICD-10-CM | POA: Diagnosis not present

## 2013-01-07 DIAGNOSIS — E669 Obesity, unspecified: Secondary | ICD-10-CM | POA: Diagnosis present

## 2013-01-07 DIAGNOSIS — G8918 Other acute postprocedural pain: Secondary | ICD-10-CM | POA: Diagnosis not present

## 2013-01-07 DIAGNOSIS — M1710 Unilateral primary osteoarthritis, unspecified knee: Secondary | ICD-10-CM | POA: Diagnosis not present

## 2013-01-07 DIAGNOSIS — M175 Other unilateral secondary osteoarthritis of knee: Secondary | ICD-10-CM | POA: Diagnosis not present

## 2013-01-07 DIAGNOSIS — Z713 Dietary counseling and surveillance: Secondary | ICD-10-CM | POA: Diagnosis not present

## 2013-01-07 DIAGNOSIS — E119 Type 2 diabetes mellitus without complications: Secondary | ICD-10-CM | POA: Diagnosis present

## 2013-01-07 DIAGNOSIS — Z01818 Encounter for other preprocedural examination: Secondary | ICD-10-CM | POA: Diagnosis not present

## 2013-01-07 DIAGNOSIS — Z6841 Body Mass Index (BMI) 40.0 and over, adult: Secondary | ICD-10-CM | POA: Diagnosis not present

## 2013-01-07 DIAGNOSIS — I10 Essential (primary) hypertension: Secondary | ICD-10-CM | POA: Diagnosis not present

## 2013-01-07 DIAGNOSIS — Z96649 Presence of unspecified artificial hip joint: Secondary | ICD-10-CM | POA: Diagnosis not present

## 2013-01-07 HISTORY — DX: Benign prostatic hyperplasia with lower urinary tract symptoms: N40.1

## 2013-01-07 HISTORY — DX: Nocturia: R35.1

## 2013-01-07 LAB — PROTIME-INR
INR: 0.94 (ref 0.00–1.49)
Prothrombin Time: 12.5 seconds (ref 11.6–15.2)

## 2013-01-07 LAB — URINALYSIS, ROUTINE W REFLEX MICROSCOPIC
Bilirubin Urine: NEGATIVE
Ketones, ur: NEGATIVE mg/dL
Nitrite: NEGATIVE
Urobilinogen, UA: 1 mg/dL (ref 0.0–1.0)
pH: 5 (ref 5.0–8.0)

## 2013-01-07 LAB — CBC
HCT: 42.1 % (ref 39.0–52.0)
MCHC: 34.4 g/dL (ref 30.0–36.0)
MCV: 89 fL (ref 78.0–100.0)
RDW: 13.5 % (ref 11.5–15.5)

## 2013-01-07 LAB — COMPREHENSIVE METABOLIC PANEL
Albumin: 3.8 g/dL (ref 3.5–5.2)
BUN: 12 mg/dL (ref 6–23)
Creatinine, Ser: 1.11 mg/dL (ref 0.50–1.35)
Total Protein: 7.4 g/dL (ref 6.0–8.3)

## 2013-01-07 LAB — TYPE AND SCREEN: Antibody Screen: NEGATIVE

## 2013-01-07 NOTE — Progress Notes (Signed)
No TED hose to fit patient. Dr Hoy Register office notified via phone conversation.

## 2013-01-07 NOTE — Progress Notes (Signed)
Pt score 5 on Stop Bang Questionnaire Faxed to PCP @ [facility] in [place]

## 2013-01-13 MED ORDER — DEXTROSE 5 % IV SOLN
3.0000 g | INTRAVENOUS | Status: AC
  Administered 2013-01-14: 3 g via INTRAVENOUS
  Filled 2013-01-13: qty 3000

## 2013-01-13 NOTE — H&P (Signed)
CHIEF COMPLAINT:  Painful left knee.   HISTORY OF PRESENT ILLNESS:  [name] is a very pleasant 62 year old African American male who is seen today for evaluation.  He has been having some longstanding problems with his joints in the past and has undergone bilateral total hip replacements, one in 2006 and one in 2009.  He has been using meloxicam for the anti-inflammatory, which had been somewhat helpful, but he has noticed now that is having increasing pain and discomfort with his knee.  He has had noted previously to have end-stage OA of all 3 compartments and has had several injections of corticosteroid.  The last was in January 2014.  He has noticed now that this is affecting his activities of daily living as well as playing golf.  He is now having pain with every step.  The pain is moderately severe and consists of an aching, throbbing pain with occasional sharpness to it.  He does have some swelling.  Occasionally he does have some symptoms of giving way.  Certainly rest improves his symptoms.  Corticosteroid injections also are very helpful, but are short lived.  Ambulation and activity worsens his symptoms.  He is seen today for evaluation.     PAST MEDICAL HISTORY:  In general his health is overall, very good for a 62 year old African American male.     PAST SURGICAL HISTORY:  Includes that of:   1.  Laparotomy cholecystectomy.  2.  In 2006 right total hip arthroplasty. 3.  In 2009 left total hip arthroplasty.    CURRENT MEDICATIONS:  1.  Meloxicam 7.5  at 2 tablets daily. 2.  Oxybutynin 5 mg 2 tablets daily.  3.  Losartan 100 mg at bedtime.  4.  Pravastatin 40 mg at bedtime. 5.  Niacin 500 mg at bedtime    ALLERGIES:  None known.   REVIEW OF SYSTEMS:  A 14-point review of systems is positive for upper and lower dentures.  He does have hypertension and it has been controlled now.  This has been for the last 15-20 years.  Occasional leg cramps are noted.  He does have hemorrhoids with  hematochezia at times, nothing recent.  He does have a spastic bladder and uses oxybutynin.  He does have nocturia x1.     FAMILY HISTORY:  Positive for his mother who is deceased at age 20 from Alzheimer's and a stroke.  She did have hypertension and diabetes.  His father is still alive at 27 and is quite healthy.  He does have a brother who died in his 60s from something intestinal.  He does have a living brother at age 79.  He has no sisters.     SOCIAL HISTORY:  He is a 62 year old African American male who is married and is on social security disability.  He quit smoking cigarettes in January 2014 and he smoked 1 pack a day for 20 years.  He does not use alcohol.     PHYSICAL EXAMINATION:  Today reveals a 62 year old [place] American male, well developed, well nourished, obese, alert and cooperative in moderate distress secondary to left knee pain.   He is 5 feet 9 inches and weighs 305 pounds.  His BMI is 45.    Vital signs reveal temperature of 97.8, pulse 100 and respiration 16, blood pressure 154/80.    Head is normocephalic.  Eyes:  Pupils equal, round, reactive to light and accommodation with extraocular movements intact.  Ear, nose and throat were benign.  Neck was  supple and no bruits were noted.  Chest had good expansion.  Lungs were essentially clear.  Cardiac had a regular rhythm and rate with normal S1 and S2.  No discrete murmurs.  Pulses 1+ bilateral and symmetric.  The abdomen is scaphoid, soft, nontender and no masses palpable.  Normal bowel sounds present  Genitorectal and breast exam not indicated for an orthopedic evaluation.  CNS:  He is oriented x3 and cranial nerves II-XII grossly intact.  Musculoskeletal:  He has range of motion from 5 degrees to 100 degrees.  He does have what appears to be an effusion, but he has a very large knee.  He has positive crepitus with range of motion.  He does have some pseudolaxity with varus and valgus stressing.  The calf is supple and  nontender.  Neurovascularly intact distally.     CLINICAL IMPRESSION:   1.  OA of the left knee.  2.  Exogenous obesity and morbid obesity. 3.  History of hypertension.   4.  Spastic bladder.   RECOMMENDATIONS:  At this time I have received a clearance from Dr. Caryn Bee, which states that he has been cleared from a cardiac as well as medical standpoint.     At this time, because of his clearance, we will schedule him for a total knee replacement on the left.  He has abided by our wishes to stop smoking and this has been since January.  The procedures, risks and benefits were fully explained to him and he is understanding.  All questions were addressed.  Models were shown to the patient.  Our plan will be to proceed with a total joint replacement.     Oris Drone Aleda [facility] [phone]  01/13/2013 5:12 PM

## 2013-01-14 ENCOUNTER — Inpatient Hospital Stay (HOSPITAL_COMMUNITY)
Admission: RE | Admit: 2013-01-14 | Discharge: 2013-01-16 | DRG: 470 | Disposition: A | Discharge status: Home Health | Payer: Medicare Other | Source: Ambulatory Visit | Attending: Orthopaedic Surgery | Admitting: Orthopaedic Surgery

## 2013-01-14 ENCOUNTER — Encounter (HOSPITAL_COMMUNITY): Payer: Self-pay | Admitting: *Deleted

## 2013-01-14 ENCOUNTER — Ambulatory Visit (HOSPITAL_COMMUNITY): Payer: Medicare Other | Admitting: Anesthesiology

## 2013-01-14 ENCOUNTER — Encounter (HOSPITAL_COMMUNITY)
Admission: RE | Disposition: A | Discharge status: Home / Self Care | Payer: Self-pay | Source: Ambulatory Visit | Attending: Orthopaedic Surgery

## 2013-01-14 ENCOUNTER — Encounter (HOSPITAL_COMMUNITY): Payer: Self-pay | Admitting: Anesthesiology

## 2013-01-14 DIAGNOSIS — R739 Hyperglycemia, unspecified: Secondary | ICD-10-CM | POA: Diagnosis present

## 2013-01-14 DIAGNOSIS — M1712 Unilateral primary osteoarthritis, left knee: Secondary | ICD-10-CM | POA: Diagnosis present

## 2013-01-14 DIAGNOSIS — E785 Hyperlipidemia, unspecified: Secondary | ICD-10-CM | POA: Diagnosis present

## 2013-01-14 DIAGNOSIS — M1710 Unilateral primary osteoarthritis, unspecified knee: Secondary | ICD-10-CM | POA: Diagnosis not present

## 2013-01-14 DIAGNOSIS — N3289 Other specified disorders of bladder: Secondary | ICD-10-CM | POA: Diagnosis present

## 2013-01-14 DIAGNOSIS — E119 Type 2 diabetes mellitus without complications: Secondary | ICD-10-CM | POA: Diagnosis present

## 2013-01-14 DIAGNOSIS — Z6841 Body Mass Index (BMI) 40.0 and over, adult: Secondary | ICD-10-CM

## 2013-01-14 DIAGNOSIS — D62 Acute posthemorrhagic anemia: Secondary | ICD-10-CM | POA: Diagnosis not present

## 2013-01-14 DIAGNOSIS — I10 Essential (primary) hypertension: Secondary | ICD-10-CM | POA: Diagnosis present

## 2013-01-14 DIAGNOSIS — M549 Dorsalgia, unspecified: Secondary | ICD-10-CM | POA: Diagnosis not present

## 2013-01-14 DIAGNOSIS — M175 Other unilateral secondary osteoarthritis of knee: Principal | ICD-10-CM | POA: Diagnosis present

## 2013-01-14 DIAGNOSIS — Z713 Dietary counseling and surveillance: Secondary | ICD-10-CM

## 2013-01-14 DIAGNOSIS — R7309 Other abnormal glucose: Secondary | ICD-10-CM | POA: Diagnosis present

## 2013-01-14 DIAGNOSIS — Z96649 Presence of unspecified artificial hip joint: Secondary | ICD-10-CM

## 2013-01-14 DIAGNOSIS — G8918 Other acute postprocedural pain: Secondary | ICD-10-CM | POA: Diagnosis not present

## 2013-01-14 DIAGNOSIS — E669 Obesity, unspecified: Secondary | ICD-10-CM | POA: Diagnosis present

## 2013-01-14 HISTORY — PX: TOTAL KNEE ARTHROPLASTY WITH REVISION COMPONENTS: SHX6198

## 2013-01-14 SURGERY — TOTAL KNEE ARTHROPLASTY WITH REVISION COMPONENTS
Anesthesia: Regional | Site: Knee | Laterality: Left | Wound class: Clean

## 2013-01-14 MED ORDER — METOCLOPRAMIDE HCL 5 MG/ML IJ SOLN: 5.0000 mg | Freq: Three times a day (TID) | INTRAMUSCULAR | Status: DC | PRN

## 2013-01-14 MED ORDER — BUPIVACAINE-EPINEPHRINE 0.25% -1:200000 IJ SOLN
INTRAMUSCULAR | Status: DC | PRN
  Administered 2013-01-14: 30 mL

## 2013-01-14 MED ORDER — KETOROLAC TROMETHAMINE 15 MG/ML IJ SOLN
15.0000 mg | Freq: Four times a day (QID) | INTRAMUSCULAR | Status: AC
  Administered 2013-01-14 (×2): 15 mg via INTRAVENOUS
  Filled 2013-01-14 (×3): qty 1

## 2013-01-14 MED ORDER — METHOCARBAMOL 500 MG PO TABS
500.0000 mg | ORAL_TABLET | Freq: Four times a day (QID) | ORAL | Status: DC | PRN
  Administered 2013-01-15: 500 mg via ORAL
  Filled 2013-01-14: qty 1

## 2013-01-14 MED ORDER — MAGNESIUM HYDROXIDE 400 MG/5ML PO SUSP
30.0000 mL | Freq: Every day | ORAL | Status: DC | PRN
  Administered 2013-01-16: 30 mL via ORAL
  Filled 2013-01-14: qty 30

## 2013-01-14 MED ORDER — SODIUM CHLORIDE 0.9 % IR SOLN
Status: DC | PRN
  Administered 2013-01-14: 3000 mL

## 2013-01-14 MED ORDER — ARTIFICIAL TEARS OP OINT
TOPICAL_OINTMENT | OPHTHALMIC | Status: DC | PRN
  Administered 2013-01-14: 1 via OPHTHALMIC

## 2013-01-14 MED ORDER — THROMBIN 20000 UNITS EX KIT
PACK | CUTANEOUS | Status: DC | PRN
  Administered 2013-01-14: 20000 [IU] via TOPICAL

## 2013-01-14 MED ORDER — SODIUM CHLORIDE 0.9 % IV SOLN: INTRAVENOUS | Status: DC

## 2013-01-14 MED ORDER — ONDANSETRON HCL 4 MG/2ML IJ SOLN: 4.0000 mg | Freq: Four times a day (QID) | INTRAMUSCULAR | Status: DC | PRN

## 2013-01-14 MED ORDER — PROPOFOL 10 MG/ML IV BOLUS
INTRAVENOUS | Status: DC | PRN
  Administered 2013-01-14: 260 mg via INTRAVENOUS

## 2013-01-14 MED ORDER — LACTATED RINGERS IV SOLN
INTRAVENOUS | Status: DC | PRN
  Administered 2013-01-14 (×2): via INTRAVENOUS

## 2013-01-14 MED ORDER — METHOCARBAMOL 100 MG/ML IJ SOLN
500.0000 mg | Freq: Four times a day (QID) | INTRAVENOUS | Status: DC | PRN
  Filled 2013-01-14: qty 5

## 2013-01-14 MED ORDER — SUCCINYLCHOLINE CHLORIDE 20 MG/ML IJ SOLN
INTRAMUSCULAR | Status: DC | PRN
  Administered 2013-01-14: 100 mg via INTRAVENOUS

## 2013-01-14 MED ORDER — ROPIVACAINE HCL 5 MG/ML IJ SOLN
INTRAMUSCULAR | Status: DC | PRN
  Administered 2013-01-14 (×2): 15 mL

## 2013-01-14 MED ORDER — BISACODYL 10 MG RE SUPP: 10.0000 mg | Freq: Every day | RECTAL | Status: DC | PRN

## 2013-01-14 MED ORDER — CHLORHEXIDINE GLUCONATE 4 % EX LIQD: 60.0000 mL | Freq: Every day | CUTANEOUS | Status: DC

## 2013-01-14 MED ORDER — FENTANYL CITRATE 0.05 MG/ML IJ SOLN: 25.0000 ug | INTRAMUSCULAR | Status: DC | PRN

## 2013-01-14 MED ORDER — ALUM & MAG HYDROXIDE-SIMETH 200-200-20 MG/5ML PO SUSP: 30.0000 mL | ORAL | Status: DC | PRN

## 2013-01-14 MED ORDER — HYDROMORPHONE HCL PF 1 MG/ML IJ SOLN: 0.5000 mg | INTRAMUSCULAR | Status: DC | PRN

## 2013-01-14 MED ORDER — MENTHOL 3 MG MT LOZG: 1.0000 | LOZENGE | OROMUCOSAL | Status: DC | PRN

## 2013-01-14 MED ORDER — OXYBUTYNIN CHLORIDE 5 MG PO TABS
5.0000 mg | ORAL_TABLET | Freq: Two times a day (BID) | ORAL | Status: DC
  Administered 2013-01-14 – 2013-01-16 (×5): 5 mg via ORAL
  Filled 2013-01-14 (×6): qty 1

## 2013-01-14 MED ORDER — CHLORHEXIDINE GLUCONATE 4 % EX LIQD: 60.0000 mL | Freq: Once | CUTANEOUS | Status: DC

## 2013-01-14 MED ORDER — BUPIVACAINE-EPINEPHRINE 0.25% -1:200000 IJ SOLN
INTRAMUSCULAR | Status: AC
  Filled 2013-01-14: qty 1

## 2013-01-14 MED ORDER — MIDAZOLAM HCL 2 MG/2ML IJ SOLN: 0.5000 mg | Freq: Once | INTRAMUSCULAR | Status: DC | PRN

## 2013-01-14 MED ORDER — ROCURONIUM BROMIDE 100 MG/10ML IV SOLN
INTRAVENOUS | Status: DC | PRN
  Administered 2013-01-14: 10 mg via INTRAVENOUS

## 2013-01-14 MED ORDER — ONDANSETRON HCL 4 MG PO TABS: 4.0000 mg | ORAL_TABLET | Freq: Four times a day (QID) | ORAL | Status: DC | PRN

## 2013-01-14 MED ORDER — CEFAZOLIN SODIUM-DEXTROSE 2-3 GM-% IV SOLR
2.0000 g | Freq: Four times a day (QID) | INTRAVENOUS | Status: AC
  Administered 2013-01-14 (×2): 2 g via INTRAVENOUS
  Filled 2013-01-14 (×2): qty 50

## 2013-01-14 MED ORDER — HYDROMORPHONE HCL PF 1 MG/ML IJ SOLN
INTRAMUSCULAR | Status: DC | PRN
  Administered 2013-01-14 (×4): .25 mg via INTRAVENOUS

## 2013-01-14 MED ORDER — OXYCODONE HCL 5 MG PO TABS
5.0000 mg | ORAL_TABLET | ORAL | Status: DC | PRN
  Administered 2013-01-14 – 2013-01-16 (×9): 10 mg via ORAL
  Filled 2013-01-14 (×9): qty 2

## 2013-01-14 MED ORDER — FENTANYL CITRATE 0.05 MG/ML IJ SOLN
INTRAMUSCULAR | Status: DC | PRN
  Administered 2013-01-14 (×4): 50 ug via INTRAVENOUS
  Administered 2013-01-14: 100 ug via INTRAVENOUS

## 2013-01-14 MED ORDER — SODIUM CHLORIDE 0.9 % IV SOLN
75.0000 mL/h | INTRAVENOUS | Status: DC
  Administered 2013-01-14 (×2): 75 mL/h via INTRAVENOUS

## 2013-01-14 MED ORDER — RIVAROXABAN 10 MG PO TABS
10.0000 mg | ORAL_TABLET | ORAL | Status: DC
  Administered 2013-01-14 – 2013-01-15 (×2): 10 mg via ORAL
  Filled 2013-01-14 (×3): qty 1

## 2013-01-14 MED ORDER — MIDAZOLAM HCL 5 MG/5ML IJ SOLN
INTRAMUSCULAR | Status: DC | PRN
  Administered 2013-01-14: 2 mg via INTRAVENOUS

## 2013-01-14 MED ORDER — PROMETHAZINE HCL 25 MG/ML IJ SOLN: 6.2500 mg | INTRAMUSCULAR | Status: DC | PRN

## 2013-01-14 MED ORDER — ACETAMINOPHEN 10 MG/ML IV SOLN
INTRAVENOUS | Status: AC
  Filled 2013-01-14: qty 100

## 2013-01-14 MED ORDER — MEPERIDINE HCL 25 MG/ML IJ SOLN: 6.2500 mg | INTRAMUSCULAR | Status: DC | PRN

## 2013-01-14 MED ORDER — FLEET ENEMA 7-19 GM/118ML RE ENEM: 1.0000 | ENEMA | Freq: Once | RECTAL | Status: AC | PRN

## 2013-01-14 MED ORDER — ACETAMINOPHEN 10 MG/ML IV SOLN
1000.0000 mg | Freq: Four times a day (QID) | INTRAVENOUS | Status: AC
  Administered 2013-01-14 – 2013-01-15 (×4): 1000 mg via INTRAVENOUS
  Filled 2013-01-14 (×4): qty 100

## 2013-01-14 MED ORDER — LIDOCAINE HCL (CARDIAC) 20 MG/ML IV SOLN
INTRAVENOUS | Status: DC | PRN
  Administered 2013-01-14: 100 mg via INTRAVENOUS

## 2013-01-14 MED ORDER — LIDOCAINE HCL 4 % MT SOLN
OROMUCOSAL | Status: DC | PRN
  Administered 2013-01-14: 4 mL via TOPICAL

## 2013-01-14 MED ORDER — BUPIVACAINE HCL (PF) 0.25 % IJ SOLN
INTRAMUSCULAR | Status: AC
  Filled 2013-01-14: qty 30

## 2013-01-14 MED ORDER — ACETAMINOPHEN 10 MG/ML IV SOLN
1000.0000 mg | Freq: Once | INTRAVENOUS | Status: AC
  Administered 2013-01-14: 1000 mg via INTRAVENOUS

## 2013-01-14 MED ORDER — THROMBIN 20000 UNITS EX KIT
PACK | CUTANEOUS | Status: AC
  Filled 2013-01-14: qty 1

## 2013-01-14 MED ORDER — DOCUSATE SODIUM 100 MG PO CAPS
100.0000 mg | ORAL_CAPSULE | Freq: Two times a day (BID) | ORAL | Status: DC
  Administered 2013-01-14 – 2013-01-16 (×4): 100 mg via ORAL
  Filled 2013-01-14 (×4): qty 1

## 2013-01-14 MED ORDER — ONDANSETRON HCL 4 MG/2ML IJ SOLN
INTRAMUSCULAR | Status: DC | PRN
  Administered 2013-01-14: 4 mg via INTRAVENOUS

## 2013-01-14 MED ORDER — LOSARTAN POTASSIUM 50 MG PO TABS
100.0000 mg | ORAL_TABLET | Freq: Every day | ORAL | Status: DC
  Administered 2013-01-14 – 2013-01-15 (×2): 100 mg via ORAL
  Filled 2013-01-14 (×4): qty 2

## 2013-01-14 MED ORDER — PHENOL 1.4 % MT LIQD: 1.0000 | OROMUCOSAL | Status: DC | PRN

## 2013-01-14 MED ORDER — LOSARTAN POTASSIUM 50 MG PO TABS: 100.0000 mg | ORAL_TABLET | Freq: Every day | ORAL | Status: DC

## 2013-01-14 MED ORDER — DEXTROSE 5 % IV SOLN
INTRAVENOUS | Status: DC | PRN
  Administered 2013-01-14 (×2): via INTRAVENOUS

## 2013-01-14 MED ORDER — METOCLOPRAMIDE HCL 10 MG PO TABS
5.0000 mg | ORAL_TABLET | Freq: Three times a day (TID) | ORAL | Status: DC | PRN
Start: 2013-01-14 — End: 2013-01-16

## 2013-01-14 SURGICAL SUPPLY — 59 items
BANDAGE ESMARK 6X9 LF (GAUZE/BANDAGES/DRESSINGS) ×1 IMPLANT
BLADE SAGITTAL 25.0X1.19X90 (BLADE) ×2 IMPLANT
BNDG ESMARK 6X9 LF (GAUZE/BANDAGES/DRESSINGS) ×2
BOWL SMART MIX CTS (DISPOSABLE) ×2 IMPLANT
CEMENT HV SMART SET (Cement) ×4 IMPLANT
CLOTH BEACON ORANGE TIMEOUT ST (SAFETY) ×2 IMPLANT
COVER BACK TABLE 24X17X13 BIG (DRAPES) ×2 IMPLANT
COVER SURGICAL LIGHT HANDLE (MISCELLANEOUS) ×2 IMPLANT
CUFF TOURNIQUET SINGLE 34IN LL (TOURNIQUET CUFF) ×2 IMPLANT
CUFF TOURNIQUET SINGLE 44IN (TOURNIQUET CUFF) IMPLANT
DRAPE EXTREMITY T 121X128X90 (DRAPE) ×2 IMPLANT
DRAPE PROXIMA HALF (DRAPES) ×2 IMPLANT
DRSG ADAPTIC 3X8 NADH LF (GAUZE/BANDAGES/DRESSINGS) ×2 IMPLANT
DRSG PAD ABDOMINAL 8X10 ST (GAUZE/BANDAGES/DRESSINGS) ×2 IMPLANT
DURAPREP 26ML APPLICATOR (WOUND CARE) ×2 IMPLANT
ELECT CAUTERY BLADE 6.4 (BLADE) ×2 IMPLANT
ELECT REM PT RETURN 9FT ADLT (ELECTROSURGICAL) ×2
ELECTRODE REM PT RTRN 9FT ADLT (ELECTROSURGICAL) ×1 IMPLANT
EVACUATOR 1/8 PVC DRAIN (DRAIN) ×2 IMPLANT
FACESHIELD LNG OPTICON STERILE (SAFETY) ×4 IMPLANT
FLOSEAL 10ML (HEMOSTASIS) IMPLANT
GLOVE BIOGEL PI IND STRL 6 (GLOVE) ×2 IMPLANT
GLOVE BIOGEL PI IND STRL 8 (GLOVE) ×1 IMPLANT
GLOVE BIOGEL PI IND STRL 8.5 (GLOVE) ×1 IMPLANT
GLOVE BIOGEL PI INDICATOR 6 (GLOVE) ×2
GLOVE BIOGEL PI INDICATOR 8 (GLOVE) ×1
GLOVE BIOGEL PI INDICATOR 8.5 (GLOVE) ×1
GLOVE ECLIPSE 8.0 STRL XLNG CF (GLOVE) ×4 IMPLANT
GLOVE SURG ORTHO 8.5 STRL (GLOVE) ×2 IMPLANT
GOWN PREVENTION PLUS XXLARGE (GOWN DISPOSABLE) ×2 IMPLANT
GOWN STRL NON-REIN LRG LVL3 (GOWN DISPOSABLE) ×4 IMPLANT
HANDPIECE INTERPULSE COAX TIP (DISPOSABLE) ×1
KIT BASIN OR (CUSTOM PROCEDURE TRAY) ×2 IMPLANT
KIT ROOM TURNOVER OR (KITS) ×2 IMPLANT
MANIFOLD NEPTUNE II (INSTRUMENTS) ×2 IMPLANT
NEEDLE 22X1 1/2 (OR ONLY) (NEEDLE) ×2 IMPLANT
NS IRRIG 1000ML POUR BTL (IV SOLUTION) ×2 IMPLANT
PACK TOTAL JOINT (CUSTOM PROCEDURE TRAY) ×2 IMPLANT
PAD ARMBOARD 7.5X6 YLW CONV (MISCELLANEOUS) ×4 IMPLANT
PAD CAST 4YDX4 CTTN HI CHSV (CAST SUPPLIES) ×1 IMPLANT
PADDING CAST COTTON 4X4 STRL (CAST SUPPLIES) ×1
PADDING CAST COTTON 6X4 STRL (CAST SUPPLIES) ×2 IMPLANT
SET HNDPC FAN SPRY TIP SCT (DISPOSABLE) ×1 IMPLANT
SPONGE GAUZE 4X4 12PLY (GAUZE/BANDAGES/DRESSINGS) ×2 IMPLANT
STAPLER VISISTAT 35W (STAPLE) ×2 IMPLANT
SUCTION FRAZIER TIP 10 FR DISP (SUCTIONS) ×2 IMPLANT
SUT BONE WAX W31G (SUTURE) ×2 IMPLANT
SUT ETHIBOND NAB CT1 #1 30IN (SUTURE) ×6 IMPLANT
SUT MNCRL AB 3-0 PS2 18 (SUTURE) ×2 IMPLANT
SUT VIC AB 0 CT1 27 (SUTURE) ×1
SUT VIC AB 0 CT1 27XBRD ANBCTR (SUTURE) ×1 IMPLANT
SUT VIC AB 1 CT1 27 (SUTURE) ×1
SUT VIC AB 1 CT1 27XBRD ANBCTR (SUTURE) ×1 IMPLANT
SYR CONTROL 10ML LL (SYRINGE) ×2 IMPLANT
TOWEL OR 17X24 6PK STRL BLUE (TOWEL DISPOSABLE) ×2 IMPLANT
TOWEL OR 17X26 10 PK STRL BLUE (TOWEL DISPOSABLE) ×2 IMPLANT
TRAY FOLEY CATH 14FR (SET/KITS/TRAYS/PACK) ×2 IMPLANT
WATER STERILE IRR 1000ML POUR (IV SOLUTION) ×4 IMPLANT
WRAP KNEE MAXI GEL POST OP (GAUZE/BANDAGES/DRESSINGS) ×2 IMPLANT

## 2013-01-14 NOTE — Preoperative (Signed)
Beta Blockers   Reason not to administer Beta Blockers:Not Applicable 

## 2013-01-14 NOTE — Transfer of Care (Signed)
Immediate Anesthesia Transfer of Care Note  Patient: Adam Barrera  Procedure(s) Performed: Procedure(s) with comments: TOTAL KNEE ARTHROPLASTY WITH REVISION COMPONENTS (Left) - Left Total knee  Patient Location: PACU  Anesthesia Type:General  Level of Consciousness: awake, alert , oriented and patient cooperative  Airway & Oxygen Therapy: Patient Spontanous Breathing and Patient connected to nasal cannula oxygen  Post-op Assessment: Report given to PACU RN and Post -op Vital signs reviewed and stable  Post vital signs: Reviewed and stable  Complications: No apparent anesthesia complications

## 2013-01-14 NOTE — Op Note (Signed)
PATIENT ID:      Adam Barrera  MRN:     [id number] DOB/AGE:    1950/10/16 / 63 y.o.       OPERATIVE REPORT    DATE OF PROCEDURE:  01/14/2013       PREOPERATIVE DIAGNOSIS:   Left Knee End Stage Osteoarthritis                                                       Estimated body mass index is 43.39 kg/(m^2) as calculated from the following:   Height as of 01/07/13: 5\' 10"  (1.778 m).   Weight as of 03/27/12: 137.168 kg (302 lb 6.4 oz).     POSTOPERATIVE DIAGNOSIS:   same                                                                    Estimated body mass index is 43.39 kg/(m^2) as calculated from the following:   Height as of 01/07/13: 5\' 10"  (1.778 m).   Weight as of 03/27/12: 137.168 kg (302 lb 6.4 oz).     PROCEDURE:  Procedure(s): TOTAL KNEE ARTHROPLASTY    SURGEON:  [name], MD    ASSISTANT:   Jacqualine Code, PA-C   (Present and scrubbed throughout the case, critical for assistance with exposure, retraction, instrumentation, and closure.)          ANESTHESIA: regional and general     DRAINS: (left knee) Hemovact drain(s) in the open with  Suction Open :      TOURNIQUET TIME:  Total Tourniquet Time Documented: Thigh (Left) - 90 minutes Total: Thigh (Left) - 90 minutes     COMPLICATIONS:  None   CONDITION:  stable   PROCEDURE IN [id number]    [name] W 01/14/2013, 9:44 AM

## 2013-01-14 NOTE — Anesthesia Procedure Notes (Addendum)
Anesthesia Regional Block:  Femoral nerve block  Pre-Anesthetic Checklist: ,, timeout performed, Correct Patient, Correct Site, Correct Laterality, Correct Procedure, Correct Position, site marked, Risks and benefits discussed,  Surgical consent,  Pre-op evaluation,  At surgeon's request and post-op pain management  Laterality: Left  Prep: chloraprep       Needles:  Injection technique: Single-shot  Needle Type: Stimiplex          Additional Needles:  Procedures: ultrasound guided (picture in chart) Femoral nerve block Narrative:  Start time: 01/14/2013 7:04 AM End time: 01/14/2013 7:19 AM Injection made incrementally with aspirations every 5 mL.  Performed by: Personally  Anesthesiologist: Brayton Caves  Additional Notes: Risks, benefits and alternative to block explained extensively.  Patient tolerated procedure well, without complications.  Supraclavicular block Procedure Name: Intubation Date/Time: 01/14/2013 7:34 AM Performed by: Tyrone Nine Pre-anesthesia Checklist: Patient identified, Emergency Drugs available, Suction available, Patient being monitored and Timeout performed Patient Re-evaluated:Patient Re-evaluated prior to inductionOxygen Delivery Method: Circle system utilized Preoxygenation: Pre-oxygenation with 100% oxygen Intubation Type: IV induction Ventilation: Mask ventilation without difficulty and Oral airway inserted - appropriate to patient size Laryngoscope Size: Mac and 3 Grade View: Grade I Tube type: Oral Tube size: 8.0 mm Number of attempts: 1 Airway Equipment and Method: Stylet Placement Confirmation: ETT inserted through vocal cords under direct vision,  breath sounds checked- equal and bilateral and positive ETCO2 Secured at: 23 cm Tube secured with: Tape Dental Injury: Teeth and Oropharynx as per pre-operative assessment

## 2013-01-14 NOTE — Plan of Care (Signed)
Problem: Consults Goal: Diagnosis- Total Joint Replacement Primary Total Knee Left     

## 2013-01-14 NOTE — Consult Note (Signed)
  The recent History & Physical has been reviewed. I have personally examined the patient today. There is no interval change to the documented History & Physical. The patient would like to proceed with the procedure.  [name] W 01/14/2013,  7:18 AM

## 2013-01-14 NOTE — Evaluation (Signed)
Physical Therapy Evaluation Patient Details Name: Adam Barrera MRN: [id number] DOB: 1951/04/11 Today's Date: 01/14/2013 Time: 6578-4696 PT Time Calculation (min): 28 min  PT Assessment / Plan / Recommendation Clinical Impression  Pt is a pleasant 62 y.o. s/p L TKA POD#0. Pt presents with decreased mobility and independence in gait secondary to pain, decreased ROM and decreased strength in  L LE. Pt to benefit from skilled PT to maximize functional mobility in acute setting. Plan for D/C with 24/7 care by family and HHPT upon acute D/C at this time.    PT Assessment  Patient needs continued PT services    Follow Up Recommendations  Home health PT;Supervision/Assistance - 24 hour    Does the patient have the potential to tolerate intense rehabilitation      Barriers to Discharge        Equipment Recommendations  None recommended by PT (has had RW and 3 in 1 delievered )    Recommendations for Other Services OT consult   Frequency 7X/week    Precautions / Restrictions Precautions Precautions: Fall;Knee Precaution Booklet Issued: Yes (comment) Precaution Comments: pt educated on PWB status and able to verbalize precautions at end of session Restrictions Weight Bearing Restrictions: Yes LLE Weight Bearing: Partial weight bearing LLE Partial Weight Bearing Percentage or Pounds: 50   Pertinent Vitals/Pain 5/10 with activity; reports L LE feels "heavy". Pt premedicated. Repositioned in chair for comfort.       Mobility  Bed Mobility Bed Mobility: Supine to Sit;Sitting - Scoot to Edge of Bed Supine to Sit: 4: Min assist;With rails;HOB elevated Sitting - Scoot to Edge of Bed: 4: Min guard;With rail Details for Bed Mobility Assistance: required vc's for hand placement and sequencing and min (A) to advance L LE to EOB secondary to pain and decreased strength  Transfers Transfers: Sit to Stand;Stand to Sit;Stand Pivot Transfers Sit to Stand: 4: Min assist;From bed;With upper  extremity assist;From elevated surface Stand to Sit: 4: Min assist;To chair/3-in-1;With upper extremity assist;With armrests Stand Pivot Transfers: 4: Min assist;From elevated surface;With armrests Details for Transfer Assistance: pt unable to attempt amb secondary to feeling weak and "heavy" on L LE; c/o mild dizziness. required vc's for hand placement and safety with RW; required assistance to advance RW at times; encouraged weightshifting on L LE; pt guarded L LE secondary to pain  Ambulation/Gait Ambulation/Gait Assistance: Not tested (comment) Stairs: No Wheelchair Mobility Wheelchair Mobility: No    Exercises Total Joint Exercises Ankle Circles/Pumps: AROM;Both;10 reps;Seated Quad Sets: PROM;Left;10 reps;Seated   PT Diagnosis: Difficulty walking;Acute pain  PT Problem List: Decreased strength;Decreased range of motion;Decreased activity tolerance;Decreased balance;Decreased mobility;Decreased knowledge of use of DME;Pain PT Treatment Interventions: DME instruction;Gait training;Stair training;Functional mobility training;Therapeutic activities;Therapeutic exercise;Balance training;Neuromuscular re-education;Patient/family education   PT Goals Acute Rehab PT Goals PT Goal Formulation: With patient Time For Goal Achievement: 01/19/13 Potential to Achieve Goals: Good Pt will go Supine/Side to Sit: with modified independence PT Goal: Supine/Side to Sit - Progress: Goal set today Pt will Sit at Edge of Bed: with modified independence PT Goal: Sit at Edge Of Bed - Progress: Goal set today Pt will go Sit to Supine/Side: with modified independence PT Goal: Sit to Supine/Side - Progress: Goal set today Pt will go Sit to Stand: with modified independence PT Goal: Sit to Stand - Progress: Goal set today Pt will go Stand to Sit: with modified independence PT Goal: Stand to Sit - Progress: Goal set today Pt will Transfer Bed to Chair/Chair to Bed: with  supervision PT Transfer Goal: Bed to  Chair/Chair to Bed - Progress: Goal set today Pt will Ambulate: >150 feet;with supervision;with rolling walker PT Goal: Ambulate - Progress: Goal set today Pt will Go Up / Down Stairs: 6-9 stairs;with supervision;with rail(s) PT Goal: Up/Down Stairs - Progress: Goal set today Pt will Perform Home Exercise Program: with supervision, verbal cues required/provided PT Goal: Perform Home Exercise Program - Progress: Goal set today  Visit Information  Last PT Received On: 01/14/13 Assistance Needed: +1    Subjective Data  Subjective: "if i am not dizzy, we are going to walk" lying supine in no distress with family present  Patient Stated Goal: to go home    Prior Functioning  Home Living Lives With: Spouse;Daughter Available Help at Discharge: Family;Available 24 hours/day Type of Home: House Home Access: Stairs to enter [employer] of Steps: 1 Entrance Stairs-Rails: None Home Layout: Two level Alternate Level Stairs-Number of Steps: 6 Alternate Level Stairs-Rails: Right Bathroom Shower/Tub: Walk-in shower;Door [employer] Toilet: Handicapped height Bathroom Accessibility: Yes How Accessible: Accessible via walker Home Adaptive Equipment: Bedside commode/3-in-1;Walker - rolling Prior Function Level of Independence: Independent Able to Take Stairs?: Yes Driving: Yes Communication Communication: No difficulties Dominant Hand: Right    Cognition  Cognition Arousal/Alertness: Awake/alert Behavior During Therapy: WFL for tasks assessed/performed Overall Cognitive Status: Within Functional Limits for tasks assessed    Extremity/Trunk Assessment Right Lower Extremity Assessment RLE ROM/Strength/Tone: Within functional levels RLE Sensation: WFL - Light Touch Left Lower Extremity Assessment LLE ROM/Strength/Tone: Deficits;Unable to fully assess;Due to pain LLE ROM/Strength/Tone Deficits: unable to assess knee secondary to pain and procedure; DF/PF WFL; hip flex 4-/5 LLE  Sensation: Deficits LLE Sensation Deficits: diminished to light touch vs. R LE  Trunk Assessment Trunk Assessment: Normal   Balance Balance Balance Assessed: Yes Static Sitting Balance Static Sitting - Balance Support: No upper extremity supported;Feet supported Static Sitting - Level of Assistance: 5: Stand by assistance Static Sitting - Comment/# of Minutes: pt c/o "swimmy headedness" while sitting EOB; O2 at 98%; subsided in less than a min with deep breathing exercises  End of Session PT - End of Session Equipment Utilized During Treatment: Gait belt Activity Tolerance: Patient tolerated treatment well Patient left: in chair;with call bell/phone within reach;with family/visitor present Nurse Communication: Mobility status;Other (comment) (IV occluded) CPM Left Knee CPM Left Knee: [address]  GP     Adam Barrera [employer], [place] [phone] 01/14/2013, 3:52 PM

## 2013-01-14 NOTE — Anesthesia Postprocedure Evaluation (Signed)
  Anesthesia Post Note  Patient: Adam Barrera  Procedure(s) Performed: Procedure(s) (LRB): TOTAL KNEE ARTHROPLASTY WITH REVISION COMPONENTS (Left)  Anesthesia type: GA  Patient location: PACU  Post pain: Pain level controlled  Post assessment: Post-op Vital signs reviewed  Last Vitals:  Filed Vitals:   01/14/13 1045  BP: 136/81  Pulse:   Temp:   Resp:     Post vital signs: Reviewed  Level of consciousness: sedated  Complications: No apparent anesthesia complications
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
## 2013-01-14 NOTE — Progress Notes (Signed)
Orthopedic Tech Progress Note Patient Details:  Adam Barrera 12/14/1950 [id number] CPM applied to Left LE with appropriate settings. OHF applied to bed.  CPM Left Knee CPM Left Knee: On Left Knee Flexion (Degrees): 60 Left Knee Extension (Degrees): 0   Asia R Thompson 01/14/2013, 11:04 AM

## 2013-01-14 NOTE — Progress Notes (Signed)
UR COMPLETED  

## 2013-01-15 ENCOUNTER — Encounter (HOSPITAL_COMMUNITY): Payer: Self-pay | Admitting: Orthopaedic Surgery

## 2013-01-15 LAB — CBC
Hemoglobin: 10.5 g/dL — ABNORMAL LOW (ref 13.0–17.0)
MCH: 30.6 pg (ref 26.0–34.0)
MCV: 89.2 fL (ref 78.0–100.0)
RBC: 3.43 MIL/uL — ABNORMAL LOW (ref 4.22–5.81)

## 2013-01-15 LAB — URINE CULTURE
Colony Count: NO GROWTH
Culture: NO GROWTH

## 2013-01-15 LAB — BASIC METABOLIC PANEL
CO2: 27 mEq/L (ref 19–32)
Calcium: 8.1 mg/dL — ABNORMAL LOW (ref 8.4–10.5)
Creatinine, Ser: 1.24 mg/dL (ref 0.50–1.35)
Glucose, Bld: 130 mg/dL — ABNORMAL HIGH (ref 70–99)

## 2013-01-15 NOTE — Progress Notes (Signed)
Physical Therapy Treatment Patient Details Name: VARIAN INNES MRN: [id number] DOB: [birth year] Today's Date: 01/15/2013 Time: 4098-1191 PT Time Calculation (min): 32 min  PT Assessment / Plan / Recommendation Comments on Treatment Session  Patient increased amublation and transferred with less assistance.  Patient was in pain but tolerated ther ex.    Follow Up Recommendations  Home health PT;Supervision/Assistance - 24 hour     Does the patient have the potential to tolerate intense rehabilitation     Barriers to Discharge        Equipment Recommendations  None recommended by PT    Recommendations for Other Services    Frequency 7X/week   Plan Discharge plan remains appropriate;Frequency remains appropriate    Precautions / Restrictions Precautions Precautions: Fall;Knee Precaution Booklet Issued: No Precaution Comments: pt educated on PWB status  Restrictions Weight Bearing Restrictions: Yes LLE Weight Bearing: Partial weight bearing LLE Partial Weight Bearing Percentage or Pounds: 50%   Pertinent Vitals/Pain 8/10 L Knee.    Mobility  Bed Mobility Bed Mobility: Supine to Sit;Sitting - Scoot to Edge of Bed Supine to Sit: HOB flat;5: Supervision Sitting - Scoot to Edge of Bed: 5: Supervision Details for Bed Mobility Assistance: Required supervision for increased time and max effort. Transfers Transfers: Sit to Stand;Stand to Sit Sit to Stand: 4: Min guard;From bed;With upper extremity assist Stand to Sit: To chair/3-in-1;With upper extremity assist;With armrests;4: Min guard Details for Transfer Assistance: Min guard to ensure proper technique and in case needed to control descent. Ambulation/Gait Ambulation/Gait Assistance: 4: Min guard Ambulation Distance (Feet): 90 Feet Assistive device: Rolling walker Ambulation/Gait Assistance Details: Patient required min vc to maintain PWB status.  Patient increased ambulation. Gait Pattern: Step-to pattern;Decreased step  length - left;Decreased step length - right;Trunk flexed Gait velocity: Decreased. Stairs: No    Exercises Total Joint Exercises Quad Sets: AROM;Left;10 reps Heel Slides: AAROM;Left;10 reps Straight Leg Raises: AAROM;Left;10 reps Long Arc Quad: AROM;Left;15 reps   PT Diagnosis:    PT Problem List:   PT Treatment Interventions:     PT Goals Acute Rehab PT Goals PT Goal: Supine/Side to Sit - Progress: Progressing toward goal PT Goal: Sit at Edge Of Bed - Progress: Progressing toward goal PT Goal: Sit to Stand - Progress: Progressing toward goal PT Goal: Stand to Sit - Progress: Progressing toward goal PT Goal: Ambulate - Progress: Progressing toward goal PT Goal: Perform Home Exercise Program - Progress: Progressing toward goal  Visit Information  Last PT Received On: 01/15/13 Assistance Needed: +1    Subjective Data      Cognition  Cognition Arousal/Alertness: Awake/alert Behavior During Therapy: WFL for tasks assessed/performed Overall Cognitive Status: Within Functional Limits for tasks assessed    Balance     End of Session PT - End of Session Equipment Utilized During Treatment: Gait belt Activity Tolerance: Patient tolerated treatment well Patient left: in chair;with call bell/phone within reach;with family/visitor present    GP     Hillary Struss JEAN SPTA 01/15/2013, 2:26 PM

## 2013-01-15 NOTE — Progress Notes (Signed)
Seen and agreed 01/15/2013 Robinette, Julia Elizabeth PTA [phone] pager [phone] office    

## 2013-01-15 NOTE — Plan of Care (Signed)
Problem: Phase II Progression Outcomes Goal: Discharge plan established Recommend HH OT for ADL trg and ADL mobility safety after acute care d/c

## 2013-01-15 NOTE — Op Note (Signed)
NAME:  FRIEDRICH, HARRIOTT NO.:  [id number]  MEDICAL RECORD NO.:  [id number]  LOCATION:  [id number]                        FACILITY:  [facility]  PHYSICIAN:  Claude Manges. Yvaine Jankowiak, M.D.DATE OF BIRTH:  Apr 10, 1951  DATE OF PROCEDURE:  01/14/2013 DATE OF DISCHARGE:                              OPERATIVE REPORT   PREOPERATIVE DIAGNOSES: 1. End-stage osteoarthritis, left knee. 2. Morbid obesity with a BMI of 43.  POSTOPERATIVE DIAGNOSES: 1. End-stage osteoarthritis, left knee. 2. Morbid obesity with a BMI of 43.  PROCEDURE:  Left total knee replacement.  SURGEON:  Claude Manges. Cleophas Dunker, M.D.  ASSISTANT:  Arlys John D. Petrarca, PA-C  ANESTHESIA:  General with supplemental femoral nerve block.  COMPLICATIONS:  None.  COMPONENTS:  DePuy LCS standard plus femoral component with a #4 rotating keeled tibial tray at 10-mm polyethylene bridging bearing a metal-backed 3 PEG patella.  Components were secured with polymethyl methacrylate.  PROCEDURE:  Mr. Empey was met in the holding area, identified the left knee as appropriate operative site and marked it accordingly.  The patient did receive a preoperative femoral nerve block by anesthesia.  The patient then transported to room #7 and placed under general anesthesia without difficulty.  Nursing staff inserted a Foley catheter. Urine was clear.  Left lower extremity was placed in a thigh tourniquet.  The left leg was then prepped with Betadine scrub and then DuraPrep from the thigh tourniquet to the ankle.  Sterile drape to the tips of the toes. Sterile draping was performed.  With the extremity elevated, there was Esmarch exsanguinated with a proximal tourniquet at 350 mmHg.  A midline longitudinal incision was made centered about the patella, extending from the superior pouch to tibial tubercle via sharp dissection, incision was carried down to subcutaneous tissue.  First layer of capsule was incised in the midline after incising  abundant adipose tissue.  The deep capsule was then incised with the Bovie.  The joint was entered.  There was a minimal clear-yellow joint effusion. The patella with some difficulty was everted to 180 degrees laterally and the knee flexed to 90 degrees.  There were large osteophytes along the medial and lateral femoral condyle as well as about the patella. There was complete absence of articular cartilage along the medial femoral condyle at the medial tibial plateau.  There were large osteophytes along the medial tibial plateau.  Osteophytes removed from the femur and the tibia.  Then, I measured the standard plus femoral component.  I elected to use the revision tibial and BT tray based on the patient's size and BM.  Bony cut was then made with the external tibial guide, with a 2 degrees angle of declination.  We checked our alignment with each bony cut.  Subsequent cuts were then made on the femur using 4 degree distal femoral valgus cut.  Flexion and extension gaps were perfectly symmetrical at 10 mm.  MCL and LCL remained intact throughout the procedure.  Lamina spreader was then placed along the medial and lateral compartments.  I removed the medial and lateral menisci, ACL and PCL and any remaining osteophytes from about the tibia.  There was a large cyst along the very  anterior medial tibia, which was debrided.  Osteophytes removed from the posterior femoral condyle using a 3/4-inch curved osteotome.  There was a Baker cyst that was debrided.  Finishing guide was then placed on the femur to obtain the tapered cuts in the center hole.  Retractors were then placed around the tibia.  We measured a #4 tibial tray.  The center hole was then made, then the revision tibial tray was then inserted for the keeled cut with the tibial jig and with the MBT revision tibial tray in place.  A 10-mm polyethylene bridging bearing was then inserted; followed by the trial femoral component  through a full range of motion.  He had perfect stability at full extension and flexion without any malrotation of the tibial tray.  Alignment looked excellent.  Patella was then measured at 26, removed 10 mm of bone, leaving approximately 15-15.5 mm patella thickness.  Patellar guide was then inserted.  The 3 holes made for the patella trial patella applied and through a full range of motion, it remained perfectly stable.  The trial components removed.  The joint was then copiously irrigated with saline solution.  The knee flexed at 90 degrees.  The final components were then inserted.  We initially inserted the MBT revision tibial tray, followed by the 10-mm bridging bearing the cemented femoral component.  The knee was then placed in full extension with excellent position of the components.  Any extraneous methacrylate was removed with a Glorious Peach.  Patella was applied with the methacrylate and the patellar clamp.  While waiting for the methacrylate to mature, we injected the deep capsule with 0.25% Marcaine with epinephrine. Approximately 17 minutes, the tourniquet was deflated for a total tourniquet time of about 91 minutes.  Gross bleeders were Bovie coagulated.  Bleeding bone was controlled with bone wax.  We did apply the sprayed thrombin to the joint surface.  Medium-sized Hemovac was then inserted through the lateral capsule.  We had nice hemostasis.  The deep capsule was then closed with interrupted #1 Ethibond, superficial capsule closed with running 0 Vicryl, subcu with 3-0 Monocryl, skin closed with skin clips.  Sterile bulky dressing was applied, following the patient's support stocking.  The patient tolerated the procedure well without complications.     Claude Manges. Cleophas Dunker, M.D.     PWW/MEDQ  D:  01/14/2013  T:  01/15/2013  Job:  [id number]

## 2013-01-15 NOTE — Evaluation (Signed)
Occupational Therapy Evaluation Patient Details Name: Adam Barrera MRN: [id number] DOB: 1950-11-02 Today's Date: 01/15/2013 Time: 1478-2956 OT Time Calculation (min): 25 min  OT Assessment / Plan / Recommendation Clinical Impression  Pt demos decline in function with LB ADLs and ADL mobility safety following L TKA. Pt would benefit from OT services to address impairments to help restore PLOF to return home safely    OT Assessment  Patient needs continued OT Services    Follow Up Recommendations  Home health OT;Supervision/Assistance - 24 hour    Barriers to Discharge None Pt's wife and dtr will be available 24 hrs/day for assist/sup   Equipment Recommendations  Tub/shower seat    Recommendations for Other Services    Frequency  Min 2X/week    Precautions / Restrictions Precautions Precautions: Fall;Knee Precaution Booklet Issued: No Precaution Comments: reviewed PWB (50%) status with pt/family Restrictions Weight Bearing Restrictions: Yes LLE Weight Bearing: Partial weight bearing LLE Partial Weight Bearing Percentage or Pounds: 50%   Pertinent Vitals/Pain 5/10    ADL  Grooming: Performed;Wash/dry hands;Wash/dry face;Min guard Where Assessed - Grooming: Supported standing Upper Body Bathing: Simulated;Supervision/safety;Set up Lower Body Bathing: Simulated;Moderate assistance Upper Body Dressing: Performed;Supervision/safety;Set up Lower Body Dressing: Performed;Moderate assistance Toilet Transfer: Performed;Min Pension scheme manager Method: Sit to Barista: Raised toilet seat with arms (or 3-in-1 over toilet) Toileting - Clothing Manipulation and Hygiene: Minimal assistance Where Assessed - Toileting Clothing Manipulation and Hygiene: Standing Transfers/Ambulation Related to ADLs: verbal cues for correct hand placement ADL Comments: pt and family provided with education and demo of ADL A/E for use at home    OT Diagnosis: Generalized  weakness;Acute pain  OT Problem List: Decreased knowledge of use of DME or AE;Decreased activity tolerance;Impaired balance (sitting and/or standing);Pain OT Treatment Interventions: Self-care/ADL training;Balance training;Neuromuscular education;Therapeutic activities;DME and/or AE instruction;Patient/family education   OT Goals Acute Rehab OT Goals OT Goal Formulation: With patient/family Time For Goal Achievement: 01/22/13 Potential to Achieve Goals: Good ADL Goals Pt Will Perform Grooming: with set-up;with supervision;Standing at sink ADL Goal: Grooming - Progress: Goal set today Pt Will Perform Lower Body Bathing: with min assist;with adaptive equipment ADL Goal: Lower Body Bathing - Progress: Goal set today Pt Will Perform Lower Body Dressing: with min assist;with adaptive equipment ADL Goal: Lower Body Dressing - Progress: Goal set today Pt Will Transfer to Toilet: with supervision;with DME;Grab bars ADL Goal: Toilet Transfer - Progress: Goal set today Pt Will Perform Toileting - Clothing Manipulation: with supervision;Standing ADL Goal: Toileting - Clothing Manipulation - Progress: Goal set today Pt Will Perform Toileting - Hygiene: with supervision;Sitting on 3-in-1 or toilet;Leaning right and/or left on 3-in-1/toilet;Standing at 3-in-1/toilet ADL Goal: Toileting - Hygiene - Progress: Goal set today Pt Will Perform Tub/Shower Transfer: Shower transfer;with DME;Grab bars ADL Goal: Tub/Shower Transfer - Progress: Goal set today  Visit Information  Last OT Received On: 01/15/13 Assistance Needed: +1    Subjective Data  Subjective: " I hope to go home tomorrow " Patient Stated Goal: To return home   Prior Functioning     Home Living Lives With: Spouse;Daughter Available Help at Discharge: Family;Available 24 hours/day Type of Home: House Home Access: Stairs to enter [employer] of Steps: 1 Entrance Stairs-Rails: None Home Layout: Two level Alternate Level  Stairs-Number of Steps: 6 Alternate Level Stairs-Rails: Right Bathroom Shower/Tub: Walk-in shower;Door [employer] Toilet: Handicapped height Bathroom Accessibility: Yes How Accessible: Accessible via walker Home Adaptive Equipment: Long-handled shoehorn Prior Function Level of Independence: Independent Able to Take Stairs?:  Yes Driving: Yes Vocation: On disability Communication Communication: No difficulties Dominant Hand: Right         Vision/Perception Vision - History Baseline Vision: Wears glasses only for reading Patient Visual Report: No change from baseline Perception Perception: Within Functional Limits   Cognition  Cognition Arousal/Alertness: Awake/alert Behavior During Therapy: WFL for tasks assessed/performed Overall Cognitive Status: Within Functional Limits for tasks assessed    Extremity/Trunk Assessment Right Upper Extremity Assessment RUE ROM/Strength/Tone: WFL for tasks assessed RUE Sensation: WFL - Light Touch RUE Coordination: WFL - gross/fine motor Left Upper Extremity Assessment LUE ROM/Strength/Tone: WFL for tasks assessed LUE Sensation: WFL - Light Touch LUE Coordination: WFL - gross/fine motor     Mobility Bed Mobility Bed Mobility: Not assessed Supine to Sit: HOB flat;5: Supervision Sitting - Scoot to Edge of Bed: 5: Supervision Details for Bed Mobility Assistance: Required supervision for increased time and max effort. Transfers Transfers: Sit to Stand;Stand to Sit Sit to Stand: 4: Min guard;With upper extremity assist;From chair/3-in-1;From toilet Stand to Sit: To chair/3-in-1;With upper extremity assist;With armrests;4: Min guard;To toilet Details for Transfer Assistance: verbal cues for correct hand placement         Balance Balance Balance Assessed: No   End of Session OT - End of Session Equipment Utilized During Treatment: Gait belt;Other (comment) (RW, 3 in 1, ADL A/E) Activity Tolerance: Patient tolerated treatment  well Patient left: in chair;with call bell/phone within reach;with family/visitor present CPM Left Knee CPM Left Knee: off  GO     Galen Manila 01/15/2013, 3:55 PM

## 2013-01-15 NOTE — Progress Notes (Signed)
Physical Therapy Treatment Patient Details Name: Adam Barrera MRN: [id number] DOB: 24-Mar-1951 Today's Date: 01/15/2013 Time: 7829-5621 PT Time Calculation (min): 27 min  PT Assessment / Plan / Recommendation Comments on Treatment Session  Patient increased amublation and transferred with less assistance.  Patient was in pain but tolerated ther ex.    Follow Up Recommendations  Home health PT;Supervision/Assistance - 24 hour     Does the patient have the potential to tolerate intense rehabilitation     Barriers to Discharge        Equipment Recommendations  None recommended by PT    Recommendations for Other Services    Frequency 7X/week   Plan Discharge plan remains appropriate;Frequency remains appropriate    Precautions / Restrictions Precautions Precautions: Fall;Knee Precaution Booklet Issued: No Precaution Comments: pt educated on PWB status  Restrictions Weight Bearing Restrictions: Yes LLE Weight Bearing: Partial weight bearing LLE Partial Weight Bearing Percentage or Pounds: 50%   Pertinent Vitals/Pain 8/10 L Knee pain.    Mobility  Bed Mobility Bed Mobility: Sitting - Scoot to Edge of Bed;Supine to Sit Supine to Sit: 5: Supervision Sitting - Scoot to Edge of Bed: 5: Supervision Details for Bed Mobility Assistance: required vc's for hand placement and technique Transfers Transfers: Sit to Stand;Stand to Sit Sit to Stand: 4: Min guard;From bed;With upper extremity assist Stand to Sit: To chair/3-in-1;With upper extremity assist;With armrests;4: Min guard Details for Transfer Assistance: Patient required VC to maintain PWB status. Ambulation/Gait Ambulation/Gait Assistance: 4: Min guard Ambulation Distance (Feet): 70 Feet Assistive device: Rolling walker Ambulation/Gait Assistance Details: Patient required min vc to maintain PWB status.  Patient increased ambulation. Gait Pattern: Step-to pattern;Decreased step length - left;Decreased step length -  right;Trunk flexed Gait velocity: Decreased. Stairs: No    Exercises Total Joint Exercises Quad Sets: AROM;Left;10 reps Heel Slides: AAROM;Left;10 reps Straight Leg Raises: AAROM;Left;10 reps Long Arc Quad: AROM;Left;10 reps   PT Diagnosis:    PT Problem List:   PT Treatment Interventions:     PT Goals Acute Rehab PT Goals PT Goal: Supine/Side to Sit - Progress: Progressing toward goal PT Goal: Sit at Edge Of Bed - Progress: Progressing toward goal PT Goal: Sit to Stand - Progress: Progressing toward goal PT Goal: Stand to Sit - Progress: Progressing toward goal PT Goal: Ambulate - Progress: Progressing toward goal  Visit Information  Last PT Received On: 01/15/13 Assistance Needed: +1    Subjective Data      Cognition  Cognition Arousal/Alertness: Awake/alert Behavior During Therapy: WFL for tasks assessed/performed Overall Cognitive Status: Within Functional Limits for tasks assessed    Balance     End of Session PT - End of Session Equipment Utilized During Treatment: Gait belt Activity Tolerance: Patient tolerated treatment well Patient left: in chair;with call bell/phone within reach;with family/visitor present CPM Left Knee CPM Left Knee: Off   GP     Natanya Holecek JEAN  SPTA 01/15/2013, 10:14 AM

## 2013-01-15 NOTE — Progress Notes (Signed)
Patient ID: Adam Barrera, male   DOB: 06-11-1951, 62 y.o.   MRN: [id number] PATIENT ID: Adam Barrera        MRN:  [id number]          DOB/AGE: 28-Sep-1950 / 62 y.o.    [name], MD   Jacqualine Code, PA-C [address] [place], [place]  [address]                             [phone]   PROGRESS NOTE  Subjective:  negative for Chest Pain  negative for Shortness of Breath  negative for Nausea/Vomiting   negative for Calf Pain    Tolerating Diet: yes         Patient reports pain as mild.     Comfortable night  Objective: Vital signs in last 24 hours:   Patient Vitals for the past 24 hrs:  BP Temp Temp src Pulse Resp SpO2 Height Weight  01/15/13 0711 108/60 mmHg 98.7 F (37.1 C) Oral 68 16 95 % - -  01/15/13 0356 110/55 mmHg - - - 18 95 % - -  01/15/13 0312 93/45 mmHg 99.5 F (37.5 C) Oral 70 16 95 % - -  01/15/13 0111 - 100 F (37.8 C) Oral - - - - -  01/14/13 2359 - - - - 18 93 % - -  01/14/13 2300 126/66 mmHg 99.9 F (37.7 C) Oral 80 18 95 % - -  01/14/13 2049 - - - - 20 96 % - -  01/14/13 1145 129/75 mmHg 97.2 F (36.2 C) - 73 18 95 % 5' 10.08" (1.78 m) 137.3 kg (302 lb 11.1 oz)  01/14/13 1110 133/70 mmHg 98 F (36.7 C) - 81 20 92 % - -  01/14/13 1105 133/70 mmHg - - 80 17 96 % - -  01/14/13 1100 - - - 74 16 95 % - -  01/14/13 1055 - - - 84 18 92 % - -  01/14/13 1050 136/81 mmHg - - 71 14 93 % - -  01/14/13 1045 136/81 mmHg - - 85 15 96 % - -  01/14/13 1040 - - - 78 15 95 % - -  01/14/13 1035 138/70 mmHg - - 89 15 91 % - -  01/14/13 1030 - - - 80 16 95 % - -  01/14/13 1025 - - - 88 16 92 % - -  01/14/13 1020 134/72 mmHg - - 89 15 93 % - -  01/14/13 1017 134/72 mmHg 97.6 F (36.4 C) - 89 15 - - -  01/14/13 1015 - - - - 18 - - -      Intake/Output from previous day:   04/22 0701 - 04/23 0700 In: 1800 [I.V.:1800] Out: 1475 [Urine:875; Drains:550]   Intake/Output this shift:   04/23 0701 - 04/23 1900 In: -  Out: 400 [Urine:400]   Intake/Output     04/22 0701 - 04/23 0700 04/23 0701 - 04/24 0700   I.V. (mL/kg) 1800 (13.1)    Total Intake(mL/kg) 1800 (13.1)    Urine (mL/kg/hr) 875 (0.3) 400 (4.2)   Drains 550 (0.2)    Blood 50 (0)    Total Output 1475 400   Net +325 -400           LABORATORY DATA:  Recent Labs  01/15/13 0420  WBC 7.6  HGB 10.5*  HCT 30.6*  PLT 132*    Recent Labs  01/15/13 0420  NA 135  K 4.0  CL 102  CO2 27  BUN 13  CREATININE 1.24  GLUCOSE 130*  CALCIUM 8.1*   Lab Results  Component Value Date   INR 0.94 01/07/2013   INR 1.0 05/05/2008    Recent Radiographic Studies :   Chest 2 View  01/07/2013  *RADIOLOGY REPORT*  Clinical Data: Preoperative assessment for knee replacement surgery, history smoking, asbestosis, hypertension  CHEST - 2 VIEW  Comparison: 11/19/2012  Findings: Normal heart size, mediastinal contours, and pulmonary vascularity. Minimal chronic peribronchial thickening. Lungs clear. No pleural effusion or pneumothorax. Scattered endplate spur formation thoracic spine.  IMPRESSION: Minimal chronic bronchitic changes. No acute abnormalities.   Original Report Authenticated By: Ulyses Southward, M.D.      Examination:  General appearance: alert, cooperative, no distress and morbidly obese  Wound Exam: clean, dry, intact   Drainage:  None: wound tissue dry  Motor Exam: EHL, FHL, Anterior Tibial and Posterior Tibial Intact  Sensory Exam: Superficial Peroneal, Deep Peroneal and Tibial normal  Vascular Exam: Normal  Assessment:    1 Day Post-Op  Procedure(s) (LRB): TOTAL KNEE ARTHROPLASTY WITH REVISION COMPONENTS (Left)  ADDITIONAL DIAGNOSIS:  Active Problems:   * No active hospital problems. *  no new problems   Plan: Physical Therapy as ordered Partial Weight Bearing @ 50% (PWB)  DVT Prophylaxis:  Xarelto  DISCHARGE PLAN: Home  DISCHARGE NEEDS: HHPT   KVO IV, D/C hemovac, OOB with PT      Valeria Batman 01/15/2013 7:42 AM

## 2013-01-16 DIAGNOSIS — M1712 Unilateral primary osteoarthritis, left knee: Secondary | ICD-10-CM | POA: Diagnosis present

## 2013-01-16 DIAGNOSIS — R739 Hyperglycemia, unspecified: Secondary | ICD-10-CM | POA: Diagnosis present

## 2013-01-16 LAB — BASIC METABOLIC PANEL
BUN: 10 mg/dL (ref 6–23)
Calcium: 8.5 mg/dL (ref 8.4–10.5)
GFR calc non Af Amer: 74 mL/min — ABNORMAL LOW (ref >90)
Glucose, Bld: 137 mg/dL — ABNORMAL HIGH (ref 70–99)

## 2013-01-16 LAB — CBC
HCT: 30.6 % — ABNORMAL LOW (ref 39.0–52.0)
Hemoglobin: 10.6 g/dL — ABNORMAL LOW (ref 13.0–17.0)
MCH: 30.5 pg (ref 26.0–34.0)
MCHC: 34.6 g/dL (ref 30.0–36.0)
MCV: 88.2 fL (ref 78.0–100.0)

## 2013-01-16 MED ORDER — METHOCARBAMOL 500 MG PO TABS: 500.0000 mg | ORAL_TABLET | Freq: Four times a day (QID) | ORAL | Status: DC | PRN

## 2013-01-16 MED ORDER — OXYCODONE HCL 5 MG PO TABS: 5.0000 mg | ORAL_TABLET | ORAL | Status: DC | PRN

## 2013-01-16 MED ORDER — RIVAROXABAN 10 MG PO TABS: 10.0000 mg | ORAL_TABLET | ORAL | Status: DC

## 2013-01-16 NOTE — Progress Notes (Signed)
Seen and agreed 01/16/2013 Whitney Hillegass Elizabeth PTA [phone] pager [phone] office    
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
## 2013-01-16 NOTE — Progress Notes (Signed)
Physical Therapy Treatment Patient Details Name: Adam Barrera MRN: [id number] DOB: 09-22-1951 Today's Date: 01/16/2013 Time: 0825-0907 PT Time Calculation (min): 42 min  PT Assessment / Plan / Recommendation Comments on Treatment Session  Patient increased amublation.  However was in more pain. Patient was in pain but tolerated ther ex.  Patient reported cramping during ther ex.  Patient accomplished stair training.    Follow Up Recommendations        Does the patient have the potential to tolerate intense rehabilitation     Barriers to Discharge        Equipment Recommendations       Recommendations for Other Services    Frequency     Plan      Precautions / Restrictions Precautions Precautions: Fall;Knee Precaution Booklet Issued: No Precaution Comments: reviewed PWB (50%) status with pt/family Restrictions Weight Bearing Restrictions: Yes LLE Weight Bearing: Partial weight bearing LLE Partial Weight Bearing Percentage or Pounds: 50%   Pertinent Vitals/Pain 7/10 L Knee Pain    Mobility  Bed Mobility Bed Mobility: Supine to Sit;Sitting - Scoot to Edge of Bed Supine to Sit: 5: Supervision Sitting - Scoot to Edge of Bed: 5: Supervision Details for Bed Mobility Assistance: Supervision for cuing. Transfers Transfers: Sit to Stand;Stand to Sit Sit to Stand: With upper extremity assist;From bed;From chair/3-in-1;4: Min assist Stand to Sit: 4: Min guard;To bed;To chair/3-in-1 Details for Transfer Assistance: Assistance as patient in more pain and had difficulty standing.   Ambulation/Gait Ambulation/Gait Assistance: 4: Min guard Ambulation Distance (Feet): 150 Feet Assistive device: Rolling walker Ambulation/Gait Assistance Details: Patient required min VC to maintain PWB status.  Patient increased ambulation. Gait Pattern: Step-to pattern;Decreased step length - left;Decreased step length - right;Trunk flexed Gait velocity: Decreased. Stairs: Yes Stairs Assistance:  4: Min assist Stairs Assistance Details (indicate cue type and reason): Cuing for proper technique and sequencing.  Min A for walker. Stair Management Technique: Backwards;With walker Number of Stairs: 3    Exercises Total Joint Exercises Quad Sets: AROM;Left;Other reps (comment) (12) Heel Slides: AAROM;Left;10 reps Straight Leg Raises: AAROM;Left;10 reps Long Arc Quad: AROM;Left;Other reps (comment) (12)   PT Diagnosis:    PT Problem List:   PT Treatment Interventions:     PT Goals Acute Rehab PT Goals PT Goal: Supine/Side to Sit - Progress: Progressing toward goal PT Goal: Sit at Edge Of Bed - Progress: Progressing toward goal PT Goal: Sit to Stand - Progress: Progressing toward goal PT Goal: Stand to Sit - Progress: Progressing toward goal PT Goal: Ambulate - Progress: Progressing toward goal PT Goal: Up/Down Stairs - Progress: Progressing toward goal  Visit Information  Last PT Received On: 01/16/13 Assistance Needed: +1    Subjective Data      Cognition  Cognition Arousal/Alertness: Awake/alert Behavior During Therapy: WFL for tasks assessed/performed Overall Cognitive Status: Within Functional Limits for tasks assessed    Balance     End of Session PT - End of Session Equipment Utilized During Treatment: Gait belt Activity Tolerance: Patient tolerated treatment well Patient left: in chair;with call bell/phone within reach;with family/visitor present   GP     Adam Barrera JEAN SPTA 01/16/2013, 9:26 AM

## 2013-01-16 NOTE — Care Management Note (Signed)
CARE MANAGEMENT NOTE 01/16/2013  Patient:  Adam Barrera,Adam Barrera   Account Number:  [id number]  Date Initiated:  01/16/2013  Documentation initiated by:  Vance Peper  Subjective/Objective Assessment:   62 yr old male s/p left total knee arthroplasty.     Action/Plan:   CM spoke with patient and wife concerning home health and DME needs at discharge. Choice offered.rolling walker, 3in1 and CPM have been delivered to patient's home.   Anticipated DC Date:  01/16/2013   Anticipated DC Plan:  HOME W HOME HEALTH SERVICES      DC Planning Services  CM consult      [facility] Choice  HOME HEALTH   Choice offered to / List presented to:  C-1 Patient        HH arranged  HH-2 PT      [facility] agency  [facility]   Status of service:  Completed, signed off Medicare Important Message given?   (If response is "NO", the following Medicare IM given date fields will be blank) Date Medicare IM given:   Date Additional Medicare IM given:    Discharge Disposition:  HOME W HOME HEALTH SERVICES  Per UR Regulation:    If discussed at Long Length of Stay Meetings, dates discussed:    Comments:

## 2013-01-16 NOTE — Progress Notes (Signed)
Patient ID: Adam Barrera, male   DOB: 18-Nov-1950, 62 y.o.   MRN: [id number] PATIENT ID: Adam Barrera        MRN:  [id number]          DOB/AGE: Sep 23, 1951 / 62 y.o.    Adam Campbell, MD   Adam Code, PA-C [address] [place], [place]  [address]                             [phone]   PROGRESS NOTE  Subjective:  negative for Chest Pain  negative for Shortness of Breath  negative for Nausea/Vomiting   negative for Calf Pain    Tolerating Diet: yes         Patient reports pain as mild and moderate.     Doing well, minimal complaints  Objective: Vital signs in last 24 hours:   Patient Vitals for the past 24 hrs:  BP Temp Temp src Pulse Resp SpO2  01/16/13 0543 128/72 mmHg 99.8 F (37.7 C) Oral 114 18 95 %  01/15/13 2100 152/72 mmHg 99.1 F (37.3 C) Oral 83 18 95 %  01/15/13 1400 135/60 mmHg 98.6 F (37 C) - 104 17 94 %      Intake/Output from previous day:   04/23 0701 - 04/24 0700 In: 440 [P.O.:440] Out: 850 [Urine:850]   Intake/Output this shift:       Intake/Output     04/23 0701 - 04/24 0700 04/24 0701 - 04/25 0700   P.O. 440    I.V. (mL/kg)     Total Intake(mL/kg) 440 (3.2)    Urine (mL/kg/hr) 850 (0.3)    Drains     Blood     Total Output 850     Net -410             LABORATORY DATA:  Recent Labs  01/15/13 0420 01/16/13 0515  WBC 7.6 10.0  HGB 10.5* 10.6*  HCT 30.6* 30.6*  PLT 132* 154    Recent Labs  01/15/13 0420 01/16/13 0515  NA 135 136  K 4.0 4.0  CL 102 101  CO2 27 28  BUN 13 10  CREATININE 1.24 1.07  GLUCOSE 130* 137*  CALCIUM 8.1* 8.5   Lab Results  Component Value Date   INR 0.94 01/07/2013   INR 1.0 05/05/2008    Recent Radiographic Studies :   Chest 2 View  01/07/2013  *RADIOLOGY REPORT*  Clinical Data: Preoperative assessment for knee replacement surgery, history smoking, asbestosis, hypertension  CHEST - 2 VIEW  Comparison: 11/19/2012  Findings: Normal heart size, mediastinal contours, and pulmonary  vascularity. Minimal chronic peribronchial thickening. Lungs clear. No pleural effusion or pneumothorax. Scattered endplate spur formation thoracic spine.  IMPRESSION: Minimal chronic bronchitic changes. No acute abnormalities.   Original Report Authenticated By: Ulyses Southward, M.D.      Examination:  General appearance: alert, cooperative, mild distress and moderate distress Resp: clear to auscultation bilaterally Cardio: regular rate and rhythm GI: normal findings: bowel sounds normal  Wound Exam: clean, dry, intact   Drainage:  None: wound tissue dry  Motor Exam: EHL, FHL, Anterior Tibial and Posterior Tibial Intact  Sensory Exam: Superficial Peroneal, Deep Peroneal and Tibial normal  Vascular Exam: Left posterior tibial artery has 1+ (weak) pulse  Assessment:    2 Days Post-Op  Procedure(s) (LRB): TOTAL KNEE ARTHROPLASTY WITH REVISION COMPONENTS (Left)  ADDITIONAL DIAGNOSIS:  Principal Problem:   Osteoarthritis of left knee  Active Problems:   Hyperglycemia  Acute Blood Loss Anemia and Hyperglycemia   Plan: Physical Therapy as ordered Partial Weight Bearing @ 50% (PWB)  DVT Prophylaxis:  Xarelto, Foot Pumps and TED hose  DISCHARGE PLAN: Home today  DISCHARGE NEEDS: HHPT, CPM, Walker and 3-in-1 comode seat           AdamBRIAN 01/16/2013 1:48 PM

## 2013-01-16 NOTE — Progress Notes (Signed)
Seen and agreed 01/16/2013 Anayansi Rundquist Elizabeth PTA [phone] pager [phone] office    

## 2013-01-16 NOTE — Progress Notes (Signed)
Physical Therapy Treatment Patient Details Name: FINIAN HELVEY MRN: [id number] DOB: [birth year] Today's Date: 01/16/2013 Time: 7829-5621 PT Time Calculation (min): 23 min  PT Assessment / Plan / Recommendation Comments on Treatment Session  Patient increased amublation. Patient accomplished stair training.    Follow Up Recommendations  Home health PT;Supervision/Assistance - 24 hour     Does the patient have the potential to tolerate intense rehabilitation     Barriers to Discharge        Equipment Recommendations  None recommended by PT    Recommendations for Other Services    Frequency 7X/week   Plan Discharge plan remains appropriate;Frequency remains appropriate    Precautions / Restrictions Precautions Precautions: Fall;Knee Precaution Booklet Issued: No Precaution Comments: reviewed PWB (50%) status with pt/family Restrictions Weight Bearing Restrictions: Yes LLE Weight Bearing: Partial weight bearing LLE Partial Weight Bearing Percentage or Pounds: 50%   Pertinent Vitals/Pain     Mobility  Bed Mobility Bed Mobility: Supine to Sit;Sitting - Scoot to Edge of Bed Supine to Sit: 5: Supervision Sitting - Scoot to Edge of Bed: 5: Supervision Details for Bed Mobility Assistance: Supervision for increased time and encouragement. Transfers Transfers: Sit to Stand;Stand to Sit Sit to Stand: With upper extremity assist;From bed;From chair/3-in-1;4: Min guard Stand to Sit: 4: Min guard;To bed;To chair/3-in-1 Details for Transfer Assistance: Pain had subsided and patient was able to transfer with more ease.  Min guard as patient is weak and following weight bearing status.   Ambulation/Gait Ambulation/Gait Assistance: 4: Min guard Ambulation Distance (Feet): 150 Feet Assistive device: Rolling walker Ambulation/Gait Assistance Details: Patient required min vc to maintain PWB status.  Patient continued increasing ambulation. Gait Pattern: Step-to pattern;Decreased step  length - left;Decreased step length - right;Trunk flexed Gait velocity: Decreased. Stairs: Yes Stairs Assistance: 4: Min assist Stairs Assistance Details (indicate cue type and reason): Min A for walker. Stair Management Technique: Backwards;With walker Number of Stairs: 3    Exercises     PT Diagnosis:    PT Problem List:   PT Treatment Interventions:     PT Goals Acute Rehab PT Goals PT Goal: Supine/Side to Sit - Progress: Progressing toward goal PT Goal: Sit at Edge Of Bed - Progress: Progressing toward goal PT Goal: Sit to Stand - Progress: Progressing toward goal PT Goal: Stand to Sit - Progress: Progressing toward goal PT Goal: Ambulate - Progress: Progressing toward goal PT Goal: Up/Down Stairs - Progress: Progressing toward goal  Visit Information  Last PT Received On: 01/16/13 Assistance Needed: +1    Subjective Data      Cognition  Cognition Arousal/Alertness: Awake/alert Behavior During Therapy: WFL for tasks assessed/performed Overall Cognitive Status: Within Functional Limits for tasks assessed    Balance     End of Session PT - End of Session Equipment Utilized During Treatment: Gait belt Activity Tolerance: Patient tolerated treatment well Patient left: in chair;with call bell/phone within reach;with family/visitor present   GP     Laqueisha Catalina JEAN SPTA 01/16/2013, 2:30 PM

## 2013-01-16 NOTE — Progress Notes (Signed)
Occupational Therapy Treatment Patient Details Name: Adam Barrera MRN: [id number] DOB: [birth year] Today's Date: 01/16/2013 Time: 4098-1191 OT Time Calculation (min): 32 min  OT Assessment / Plan / Recommendation Comments on Treatment Session  Pt progressing towards goals. Min A for lower body ADLs and minguard for shower transfer. Family present for education. Will have assistance at home 24/7.     Follow Up Recommendations  Home health OT;Supervision/Assistance - 24 hour    Barriers to Discharge       Equipment Recommendations  Tub/shower seat    Recommendations for Other Services    Frequency Min 2X/week   Plan Discharge plan remains appropriate    Precautions / Restrictions Precautions Precautions: Fall;Knee Precaution Booklet Issued: No Precaution Comments: reviewed PWB (50%) status with pt/family Restrictions Weight Bearing Restrictions: Yes LLE Weight Bearing: Partial weight bearing LLE Partial Weight Bearing Percentage or Pounds: 50%   Pertinent Vitals/Pain Pain 6/10 in knee. Repositioned.     ADL  Grooming: Wash/dry hands;Min guard Where Assessed - Grooming: Supported standing Upper Body Bathing: Set up Where Assessed - Upper Body Bathing: Unsupported sitting Lower Body Bathing: Minimal assistance Where Assessed - Lower Body Bathing: Supported sit to stand Upper Body Dressing: Set up Where Assessed - Upper Body Dressing: Unsupported sitting Lower Body Dressing: Minimal assistance Where Assessed - Lower Body Dressing: Supported sit to Pharmacist, hospital: Min Pension scheme manager Method: Sit to Barista: Raised toilet seat with arms (or 3-in-1 over toilet) Toileting - Clothing Manipulation and Hygiene: Min guard Where Assessed - Engineer, mining and Hygiene: Standing Tub/Shower Transfer: Insurance risk surveyor Method: Science writer: Other (comment) (3 in 1) Equipment Used: Gait  belt;Sock aid;Rolling walker;Reacher;Long-handled shoe horn ADL Comments: Pt at Min A level for LB ADLs with AE. Family present to watch demonstration and use of AE. Minguard for shower transfer.    OT Diagnosis:    OT Problem List:   OT Treatment Interventions:     OT Goals Acute Rehab OT Goals OT Goal Formulation: With patient/family Time For Goal Achievement: 01/22/13 Potential to Achieve Goals: Good ADL Goals Pt Will Perform Grooming: with set-up;with supervision;Standing at sink ADL Goal: Grooming - Progress: Progressing toward goals Pt Will Perform Lower Body Bathing: with min assist;with adaptive equipment ADL Goal: Lower Body Bathing - Progress: Met Pt Will Perform Lower Body Dressing: with min assist;with adaptive equipment ADL Goal: Lower Body Dressing - Progress: Met Pt Will Transfer to Toilet: with supervision;with DME;Grab bars ADL Goal: Toilet Transfer - Progress: Progressing toward goals Pt Will Perform Toileting - Clothing Manipulation: with supervision;Standing ADL Goal: Toileting - Clothing Manipulation - Progress: Progressing toward goals Pt Will Perform Toileting - Hygiene: with supervision;Sitting on 3-in-1 or toilet;Leaning right and/or left on 3-in-1/toilet;Standing at 3-in-1/toilet ADL Goal: Toileting - Hygiene - Progress: Progressing toward goals Pt Will Perform Tub/Shower Transfer: Shower transfer;with DME;Grab bars;with supervision ADL Goal: Tub/Shower Transfer - Progress: Progressing toward goals  Visit Information  Last OT Received On: 01/16/13 Assistance Needed: +1    Subjective Data      Prior Functioning       Cognition  Cognition Arousal/Alertness: Awake/alert Behavior During Therapy: WFL for tasks assessed/performed Overall Cognitive Status: Within Functional Limits for tasks assessed    Mobility  Bed Mobility Bed Mobility: Not assessed  Transfers Transfers: Sit to Stand;Stand to Sit Sit to Stand: 4: Min guard;With upper extremity  assist;From chair/3-in-1 Stand to Sit: 4: Min guard;With upper extremity assist;To chair/3-in-1 Details for Transfer  Assistance: minguard for safety    Exercises      Balance     End of Session OT - End of Session Equipment Utilized During Treatment: Gait belt Activity Tolerance: Patient tolerated treatment well Patient left: in chair;with family/visitor present Nurse Communication: Other (comment) (pt ready to get IV taken out before d/c)  GO     Earlie Raveling OTR/L [phone] 01/16/2013, 3:42 PM

## 2013-01-17 DIAGNOSIS — I10 Essential (primary) hypertension: Secondary | ICD-10-CM | POA: Diagnosis not present

## 2013-01-17 DIAGNOSIS — Z471 Aftercare following joint replacement surgery: Secondary | ICD-10-CM | POA: Diagnosis not present

## 2013-01-17 DIAGNOSIS — Z96649 Presence of unspecified artificial hip joint: Secondary | ICD-10-CM | POA: Diagnosis not present

## 2013-01-17 DIAGNOSIS — Z6841 Body Mass Index (BMI) 40.0 and over, adult: Secondary | ICD-10-CM | POA: Diagnosis not present

## 2013-01-17 DIAGNOSIS — IMO0001 Reserved for inherently not codable concepts without codable children: Secondary | ICD-10-CM | POA: Diagnosis not present

## 2013-01-17 DIAGNOSIS — Z96659 Presence of unspecified artificial knee joint: Secondary | ICD-10-CM | POA: Diagnosis not present

## 2013-01-20 DIAGNOSIS — Z471 Aftercare following joint replacement surgery: Secondary | ICD-10-CM | POA: Diagnosis not present

## 2013-01-20 DIAGNOSIS — Z96649 Presence of unspecified artificial hip joint: Secondary | ICD-10-CM | POA: Diagnosis not present

## 2013-01-20 DIAGNOSIS — Z6841 Body Mass Index (BMI) 40.0 and over, adult: Secondary | ICD-10-CM | POA: Diagnosis not present

## 2013-01-20 DIAGNOSIS — IMO0001 Reserved for inherently not codable concepts without codable children: Secondary | ICD-10-CM | POA: Diagnosis not present

## 2013-01-20 DIAGNOSIS — I10 Essential (primary) hypertension: Secondary | ICD-10-CM | POA: Diagnosis not present

## 2013-01-20 DIAGNOSIS — Z96659 Presence of unspecified artificial knee joint: Secondary | ICD-10-CM | POA: Diagnosis not present

## 2013-01-20 NOTE — Progress Notes (Signed)
Late Entry: PT is recommending home with HH and not SNF. Clinical Social Worker will sign off for now as social work intervention is no longer needed. Please consult us again if new need arises.   Leoda Smithhart, MSW [phone] 

## 2013-01-21 DIAGNOSIS — Z6841 Body Mass Index (BMI) 40.0 and over, adult: Secondary | ICD-10-CM | POA: Diagnosis not present

## 2013-01-21 DIAGNOSIS — I10 Essential (primary) hypertension: Secondary | ICD-10-CM | POA: Diagnosis not present

## 2013-01-21 DIAGNOSIS — IMO0001 Reserved for inherently not codable concepts without codable children: Secondary | ICD-10-CM | POA: Diagnosis not present

## 2013-01-21 DIAGNOSIS — Z471 Aftercare following joint replacement surgery: Secondary | ICD-10-CM | POA: Diagnosis not present

## 2013-01-21 DIAGNOSIS — Z96659 Presence of unspecified artificial knee joint: Secondary | ICD-10-CM | POA: Diagnosis not present

## 2013-01-21 DIAGNOSIS — Z96649 Presence of unspecified artificial hip joint: Secondary | ICD-10-CM | POA: Diagnosis not present

## 2013-01-23 DIAGNOSIS — Z6841 Body Mass Index (BMI) 40.0 and over, adult: Secondary | ICD-10-CM | POA: Diagnosis not present

## 2013-01-23 DIAGNOSIS — Z96649 Presence of unspecified artificial hip joint: Secondary | ICD-10-CM | POA: Diagnosis not present

## 2013-01-23 DIAGNOSIS — Z96659 Presence of unspecified artificial knee joint: Secondary | ICD-10-CM | POA: Diagnosis not present

## 2013-01-23 DIAGNOSIS — Z471 Aftercare following joint replacement surgery: Secondary | ICD-10-CM | POA: Diagnosis not present

## 2013-01-23 DIAGNOSIS — I10 Essential (primary) hypertension: Secondary | ICD-10-CM | POA: Diagnosis not present

## 2013-01-23 DIAGNOSIS — IMO0001 Reserved for inherently not codable concepts without codable children: Secondary | ICD-10-CM | POA: Diagnosis not present

## 2013-01-24 ENCOUNTER — Other Ambulatory Visit: Payer: Self-pay | Admitting: Nurse Practitioner

## 2013-01-24 DIAGNOSIS — Z471 Aftercare following joint replacement surgery: Secondary | ICD-10-CM | POA: Diagnosis not present

## 2013-01-24 DIAGNOSIS — Z6841 Body Mass Index (BMI) 40.0 and over, adult: Secondary | ICD-10-CM | POA: Diagnosis not present

## 2013-01-24 DIAGNOSIS — Z96649 Presence of unspecified artificial hip joint: Secondary | ICD-10-CM | POA: Diagnosis not present

## 2013-01-24 DIAGNOSIS — I10 Essential (primary) hypertension: Secondary | ICD-10-CM | POA: Diagnosis not present

## 2013-01-24 DIAGNOSIS — G4730 Sleep apnea, unspecified: Secondary | ICD-10-CM

## 2013-01-24 DIAGNOSIS — Z96659 Presence of unspecified artificial knee joint: Secondary | ICD-10-CM | POA: Diagnosis not present

## 2013-01-24 DIAGNOSIS — IMO0001 Reserved for inherently not codable concepts without codable children: Secondary | ICD-10-CM | POA: Diagnosis not present

## 2013-01-24 NOTE — Progress Notes (Signed)
Patient aware.

## 2013-01-27 DIAGNOSIS — M1710 Unilateral primary osteoarthritis, unspecified knee: Secondary | ICD-10-CM | POA: Diagnosis not present

## 2013-01-27 DIAGNOSIS — Z96659 Presence of unspecified artificial knee joint: Secondary | ICD-10-CM | POA: Diagnosis not present

## 2013-01-28 DIAGNOSIS — Z6841 Body Mass Index (BMI) 40.0 and over, adult: Secondary | ICD-10-CM | POA: Diagnosis not present

## 2013-01-28 DIAGNOSIS — I10 Essential (primary) hypertension: Secondary | ICD-10-CM | POA: Diagnosis not present

## 2013-01-28 DIAGNOSIS — Z471 Aftercare following joint replacement surgery: Secondary | ICD-10-CM | POA: Diagnosis not present

## 2013-01-28 DIAGNOSIS — IMO0001 Reserved for inherently not codable concepts without codable children: Secondary | ICD-10-CM | POA: Diagnosis not present

## 2013-01-28 DIAGNOSIS — Z96649 Presence of unspecified artificial hip joint: Secondary | ICD-10-CM | POA: Diagnosis not present

## 2013-01-28 DIAGNOSIS — Z96659 Presence of unspecified artificial knee joint: Secondary | ICD-10-CM | POA: Diagnosis not present

## 2013-01-29 ENCOUNTER — Other Ambulatory Visit (HOSPITAL_COMMUNITY): Payer: Self-pay

## 2013-01-29 DIAGNOSIS — IMO0001 Reserved for inherently not codable concepts without codable children: Secondary | ICD-10-CM | POA: Diagnosis not present

## 2013-01-29 DIAGNOSIS — I10 Essential (primary) hypertension: Secondary | ICD-10-CM | POA: Diagnosis not present

## 2013-01-29 DIAGNOSIS — Z6841 Body Mass Index (BMI) 40.0 and over, adult: Secondary | ICD-10-CM | POA: Diagnosis not present

## 2013-01-29 DIAGNOSIS — G4730 Sleep apnea, unspecified: Secondary | ICD-10-CM

## 2013-01-29 DIAGNOSIS — Z471 Aftercare following joint replacement surgery: Secondary | ICD-10-CM | POA: Diagnosis not present

## 2013-01-29 DIAGNOSIS — Z96659 Presence of unspecified artificial knee joint: Secondary | ICD-10-CM | POA: Diagnosis not present

## 2013-01-29 DIAGNOSIS — Z96649 Presence of unspecified artificial hip joint: Secondary | ICD-10-CM | POA: Diagnosis not present

## 2013-01-31 DIAGNOSIS — I10 Essential (primary) hypertension: Secondary | ICD-10-CM | POA: Diagnosis not present

## 2013-01-31 DIAGNOSIS — Z96659 Presence of unspecified artificial knee joint: Secondary | ICD-10-CM | POA: Diagnosis not present

## 2013-01-31 DIAGNOSIS — Z471 Aftercare following joint replacement surgery: Secondary | ICD-10-CM | POA: Diagnosis not present

## 2013-01-31 DIAGNOSIS — Z6841 Body Mass Index (BMI) 40.0 and over, adult: Secondary | ICD-10-CM | POA: Diagnosis not present

## 2013-01-31 DIAGNOSIS — Z96649 Presence of unspecified artificial hip joint: Secondary | ICD-10-CM | POA: Diagnosis not present

## 2013-01-31 DIAGNOSIS — IMO0001 Reserved for inherently not codable concepts without codable children: Secondary | ICD-10-CM | POA: Diagnosis not present

## 2013-02-04 DIAGNOSIS — Z96649 Presence of unspecified artificial hip joint: Secondary | ICD-10-CM | POA: Diagnosis not present

## 2013-02-04 DIAGNOSIS — Z96659 Presence of unspecified artificial knee joint: Secondary | ICD-10-CM | POA: Diagnosis not present

## 2013-02-04 DIAGNOSIS — IMO0001 Reserved for inherently not codable concepts without codable children: Secondary | ICD-10-CM | POA: Diagnosis not present

## 2013-02-04 DIAGNOSIS — Z471 Aftercare following joint replacement surgery: Secondary | ICD-10-CM | POA: Diagnosis not present

## 2013-02-04 DIAGNOSIS — I10 Essential (primary) hypertension: Secondary | ICD-10-CM | POA: Diagnosis not present

## 2013-02-04 DIAGNOSIS — Z6841 Body Mass Index (BMI) 40.0 and over, adult: Secondary | ICD-10-CM | POA: Diagnosis not present

## 2013-02-06 DIAGNOSIS — IMO0001 Reserved for inherently not codable concepts without codable children: Secondary | ICD-10-CM | POA: Diagnosis not present

## 2013-02-06 DIAGNOSIS — I10 Essential (primary) hypertension: Secondary | ICD-10-CM | POA: Diagnosis not present

## 2013-02-06 DIAGNOSIS — Z471 Aftercare following joint replacement surgery: Secondary | ICD-10-CM | POA: Diagnosis not present

## 2013-02-06 DIAGNOSIS — Z96659 Presence of unspecified artificial knee joint: Secondary | ICD-10-CM | POA: Diagnosis not present

## 2013-02-06 DIAGNOSIS — Z96649 Presence of unspecified artificial hip joint: Secondary | ICD-10-CM | POA: Diagnosis not present

## 2013-02-06 DIAGNOSIS — Z6841 Body Mass Index (BMI) 40.0 and over, adult: Secondary | ICD-10-CM | POA: Diagnosis not present

## 2013-02-10 DIAGNOSIS — Z6841 Body Mass Index (BMI) 40.0 and over, adult: Secondary | ICD-10-CM | POA: Diagnosis not present

## 2013-02-10 DIAGNOSIS — I10 Essential (primary) hypertension: Secondary | ICD-10-CM | POA: Diagnosis not present

## 2013-02-10 DIAGNOSIS — Z96649 Presence of unspecified artificial hip joint: Secondary | ICD-10-CM | POA: Diagnosis not present

## 2013-02-10 DIAGNOSIS — Z471 Aftercare following joint replacement surgery: Secondary | ICD-10-CM | POA: Diagnosis not present

## 2013-02-10 DIAGNOSIS — Z96659 Presence of unspecified artificial knee joint: Secondary | ICD-10-CM | POA: Diagnosis not present

## 2013-02-10 DIAGNOSIS — IMO0001 Reserved for inherently not codable concepts without codable children: Secondary | ICD-10-CM | POA: Diagnosis not present

## 2013-02-13 DIAGNOSIS — Z6841 Body Mass Index (BMI) 40.0 and over, adult: Secondary | ICD-10-CM | POA: Diagnosis not present

## 2013-02-13 DIAGNOSIS — IMO0001 Reserved for inherently not codable concepts without codable children: Secondary | ICD-10-CM | POA: Diagnosis not present

## 2013-02-13 DIAGNOSIS — Z96649 Presence of unspecified artificial hip joint: Secondary | ICD-10-CM | POA: Diagnosis not present

## 2013-02-13 DIAGNOSIS — I10 Essential (primary) hypertension: Secondary | ICD-10-CM | POA: Diagnosis not present

## 2013-02-13 DIAGNOSIS — Z471 Aftercare following joint replacement surgery: Secondary | ICD-10-CM | POA: Diagnosis not present

## 2013-02-13 DIAGNOSIS — Z96659 Presence of unspecified artificial knee joint: Secondary | ICD-10-CM | POA: Diagnosis not present

## 2013-02-25 ENCOUNTER — Ambulatory Visit: Payer: Medicare Other | Attending: Orthopaedic Surgery | Admitting: Physical Therapy

## 2013-02-25 DIAGNOSIS — M25669 Stiffness of unspecified knee, not elsewhere classified: Secondary | ICD-10-CM | POA: Diagnosis not present

## 2013-02-25 DIAGNOSIS — R5381 Other malaise: Secondary | ICD-10-CM | POA: Diagnosis not present

## 2013-02-25 DIAGNOSIS — IMO0001 Reserved for inherently not codable concepts without codable children: Secondary | ICD-10-CM | POA: Diagnosis not present

## 2013-02-25 DIAGNOSIS — M25569 Pain in unspecified knee: Secondary | ICD-10-CM | POA: Diagnosis not present

## 2013-03-04 ENCOUNTER — Ambulatory Visit: Payer: Medicare Other | Admitting: Physical Therapy

## 2013-03-04 DIAGNOSIS — M25569 Pain in unspecified knee: Secondary | ICD-10-CM | POA: Diagnosis not present

## 2013-03-04 DIAGNOSIS — R5381 Other malaise: Secondary | ICD-10-CM | POA: Diagnosis not present

## 2013-03-04 DIAGNOSIS — M25669 Stiffness of unspecified knee, not elsewhere classified: Secondary | ICD-10-CM | POA: Diagnosis not present

## 2013-03-04 DIAGNOSIS — IMO0001 Reserved for inherently not codable concepts without codable children: Secondary | ICD-10-CM | POA: Diagnosis not present

## 2013-03-06 ENCOUNTER — Ambulatory Visit: Payer: Medicare Other | Admitting: Physical Therapy

## 2013-03-06 DIAGNOSIS — M25569 Pain in unspecified knee: Secondary | ICD-10-CM | POA: Diagnosis not present

## 2013-03-06 DIAGNOSIS — R5381 Other malaise: Secondary | ICD-10-CM | POA: Diagnosis not present

## 2013-03-06 DIAGNOSIS — M25669 Stiffness of unspecified knee, not elsewhere classified: Secondary | ICD-10-CM | POA: Diagnosis not present

## 2013-03-06 DIAGNOSIS — IMO0001 Reserved for inherently not codable concepts without codable children: Secondary | ICD-10-CM | POA: Diagnosis not present

## 2013-03-11 ENCOUNTER — Ambulatory Visit: Payer: Medicare Other | Admitting: Physical Therapy

## 2013-03-11 DIAGNOSIS — M25569 Pain in unspecified knee: Secondary | ICD-10-CM | POA: Diagnosis not present

## 2013-03-11 DIAGNOSIS — M25669 Stiffness of unspecified knee, not elsewhere classified: Secondary | ICD-10-CM | POA: Diagnosis not present

## 2013-03-11 DIAGNOSIS — R5381 Other malaise: Secondary | ICD-10-CM | POA: Diagnosis not present

## 2013-03-11 DIAGNOSIS — IMO0001 Reserved for inherently not codable concepts without codable children: Secondary | ICD-10-CM | POA: Diagnosis not present

## 2013-03-13 ENCOUNTER — Ambulatory Visit: Payer: Medicare Other

## 2013-03-13 DIAGNOSIS — IMO0001 Reserved for inherently not codable concepts without codable children: Secondary | ICD-10-CM | POA: Diagnosis not present

## 2013-03-13 DIAGNOSIS — M25669 Stiffness of unspecified knee, not elsewhere classified: Secondary | ICD-10-CM | POA: Diagnosis not present

## 2013-03-13 DIAGNOSIS — R5381 Other malaise: Secondary | ICD-10-CM | POA: Diagnosis not present

## 2013-03-13 DIAGNOSIS — M25569 Pain in unspecified knee: Secondary | ICD-10-CM | POA: Diagnosis not present

## 2013-03-25 ENCOUNTER — Ambulatory Visit: Payer: Medicare Other | Attending: Orthopaedic Surgery | Admitting: Physical Therapy

## 2013-03-25 DIAGNOSIS — IMO0001 Reserved for inherently not codable concepts without codable children: Secondary | ICD-10-CM | POA: Insufficient documentation

## 2013-03-25 DIAGNOSIS — R5381 Other malaise: Secondary | ICD-10-CM | POA: Diagnosis not present

## 2013-03-25 DIAGNOSIS — M25669 Stiffness of unspecified knee, not elsewhere classified: Secondary | ICD-10-CM | POA: Insufficient documentation

## 2013-03-25 DIAGNOSIS — M25569 Pain in unspecified knee: Secondary | ICD-10-CM | POA: Diagnosis not present

## 2013-04-01 ENCOUNTER — Ambulatory Visit: Payer: Medicare Other

## 2013-04-02 ENCOUNTER — Telehealth: Payer: Self-pay | Admitting: *Deleted

## 2013-04-02 DIAGNOSIS — J61 Pneumoconiosis due to asbestos and other mineral fibers: Secondary | ICD-10-CM

## 2013-04-02 NOTE — Telephone Encounter (Signed)
Needs non con ct chest  LMTCB

## 2013-04-02 NOTE — Telephone Encounter (Signed)
Message copied by Christen Butter on Wed Apr 02, 2013 12:08 PM ------      Message from: Sandrea Hughs B      Created: Wed Mar 27, 2012 11:58 AM       Needs repeat CT for asbestosis ------

## 2013-04-03 ENCOUNTER — Ambulatory Visit: Payer: Medicare Other

## 2013-04-07 DIAGNOSIS — M47817 Spondylosis without myelopathy or radiculopathy, lumbosacral region: Secondary | ICD-10-CM | POA: Diagnosis not present

## 2013-04-07 DIAGNOSIS — M48061 Spinal stenosis, lumbar region without neurogenic claudication: Secondary | ICD-10-CM | POA: Diagnosis not present

## 2013-04-07 DIAGNOSIS — IMO0002 Reserved for concepts with insufficient information to code with codable children: Secondary | ICD-10-CM | POA: Diagnosis not present

## 2013-04-08 ENCOUNTER — Ambulatory Visit: Payer: Medicare Other | Admitting: Physical Therapy

## 2013-04-10 ENCOUNTER — Ambulatory Visit: Payer: Medicare Other | Admitting: Physical Therapy

## 2013-04-11 ENCOUNTER — Other Ambulatory Visit: Payer: Self-pay | Admitting: Nurse Practitioner

## 2013-04-14 NOTE — Telephone Encounter (Signed)
LAST OV 2/14 WITH ACM.

## 2013-04-14 NOTE — Telephone Encounter (Signed)
Patient needs an appointment to be seen May refill the medication x1

## 2013-04-15 ENCOUNTER — Ambulatory Visit: Payer: Medicare Other | Admitting: Physical Therapy

## 2013-04-17 ENCOUNTER — Ambulatory Visit (INDEPENDENT_AMBULATORY_CARE_PROVIDER_SITE_OTHER): Payer: Medicare Other | Admitting: Family Medicine

## 2013-04-17 ENCOUNTER — Encounter: Payer: Self-pay | Admitting: Family Medicine

## 2013-04-17 ENCOUNTER — Ambulatory Visit: Payer: Medicare Other | Admitting: Physical Therapy

## 2013-04-17 VITALS — BP 117/75 | HR 66 | Temp 97.6°F | Ht 70.0 in | Wt 298.0 lb

## 2013-04-17 DIAGNOSIS — M159 Polyosteoarthritis, unspecified: Secondary | ICD-10-CM

## 2013-04-17 DIAGNOSIS — N400 Enlarged prostate without lower urinary tract symptoms: Secondary | ICD-10-CM | POA: Diagnosis not present

## 2013-04-17 DIAGNOSIS — R351 Nocturia: Secondary | ICD-10-CM | POA: Diagnosis not present

## 2013-04-17 DIAGNOSIS — I10 Essential (primary) hypertension: Secondary | ICD-10-CM

## 2013-04-17 DIAGNOSIS — Z0000 Encounter for general adult medical examination without abnormal findings: Secondary | ICD-10-CM | POA: Diagnosis not present

## 2013-04-17 DIAGNOSIS — E785 Hyperlipidemia, unspecified: Secondary | ICD-10-CM | POA: Diagnosis not present

## 2013-04-17 LAB — POCT CBC
Granulocyte percent: 77.4 %G (ref 37–80)
HCT, POC: 40.7 % — AB (ref 43.5–53.7)
Hemoglobin: 13.4 g/dL — AB (ref 14.1–18.1)
Lymph, poc: 1.7 (ref 0.6–3.4)
MCH, POC: 28.7 pg (ref 27–31.2)
MCHC: 32.9 g/dL (ref 31.8–35.4)
MCV: 87.2 fL (ref 80–97)
MPV: 8.6 fL (ref 0–99.8)
POC Granulocyte: 6 (ref 2–6.9)
POC LYMPH PERCENT: 22 %L (ref 10–50)
Platelet Count, POC: 185 10*3/uL (ref 142–424)
RBC: 4.7 M/uL (ref 4.69–6.13)
RDW, POC: 15.1 %
WBC: 7.7 10*3/uL (ref 4.6–10.2)

## 2013-04-17 MED ORDER — MELOXICAM 7.5 MG PO TABS: 7.5000 mg | ORAL_TABLET | Freq: Every day | ORAL | Status: DC

## 2013-04-17 MED ORDER — PRAVASTATIN SODIUM 40 MG PO TABS: 40.0000 mg | ORAL_TABLET | Freq: Every day | ORAL | Status: DC

## 2013-04-17 MED ORDER — OXYBUTYNIN CHLORIDE 5 MG PO TABS: 5.0000 mg | ORAL_TABLET | Freq: Two times a day (BID) | ORAL | Status: DC

## 2013-04-17 MED ORDER — NIACIN 500 MG PO TABS: 500.0000 mg | ORAL_TABLET | Freq: Every day | ORAL | Status: DC

## 2013-04-17 MED ORDER — LOSARTAN POTASSIUM 100 MG PO TABS: 100.0000 mg | ORAL_TABLET | Freq: Every day | ORAL | Status: DC

## 2013-04-17 NOTE — Progress Notes (Signed)
  Subjective:    Patient ID: Adam Barrera, male    DOB: Nov 08, 1950, 62 y.o.   MRN: [id number]  HPI This 62 y.o. male presents for evaluation of bph, hypertension, OA, hyperlipidemia, obesity, And tobacco abuse.  He has family hx (father) of prostate cancer. He has no PSA in chart. He has been seeing GI for colonoscopy and polyposis.  He is not due for colonoscopy since he Had one 2 years ago.  He has had a left knee arthroplasty and he states he was told by The doctor at his pre-op that he needed a sleep study but denies snoring and hypersomnolence. He has no c/o from wife who sleeps with him.   Review of Systems No chest pain, SOB, HA, dizziness, vision change, N/V, diarrhea, constipation, dysuria, urinary urgency or frequency, myalgias, arthralgias or rash.     Objective:   Physical Exam Vital signs noted  Well developed well nourished male.  HEENT - Head atraumatic Normocephalic                Eyes - PERRLA, Conjuctiva - clear Sclera- Clear EOMI                Ears - EAC's Wnl TM's Wnl Gross Hearing WNL                Nose - Nares patent                 Throat - oropharanx wnl Respiratory - Lungs CTA bilateral Cardiac - RRR S1 and S2 without murmur GI - Abdomen soft Nontender and bowel sounds active x 4. Rectal - Prostate enlarged no masses, hemocult negative. Extremities - No edema. Neuro - Grossly intact.       Assessment & Plan:  Essential hypertension, benign - Plan: losartan (COZAAR) 100 MG tablet, POCT CBC, Lipid panel, TSH, PSA, COMPLETE METABOLIC PANEL WITH GFR.  Controlled and continue current regimen.  Other and unspecified hyperlipidemia - Plan: pravastatin (PRAVACHOL) 40 MG tablet, niacin 500 MG tablet, POCT CBC, Lipid panel, TSH, PSA, COMPLETE METABOLIC PANEL WITH GFR.  Continue current regimen.  BPH (benign prostatic hyperplasia) - Plan: oxybutynin (DITROPAN) 5 MG tablet, POCT CBC, Lipid panel, TSH, PSA, COMPLETE METABOLIC PANEL WITH GFR.  Prostate mildly  enlarged, sounds like his BPH is controlle.  Generalized OA - Plan: meloxicam (MOBIC) 7.5 MG tablet, POCT CBC, Lipid panel, TSH, PSA, COMPLETE METABOLIC PANEL WITH GFR Continue Meloxicam. Routine general medical examination at a health care facility - Plan: POCT CBC, Lipid panel, TSH, PSA, COMPLETE METABOLIC PANEL WITH GFR.  Follow up colonoscopy for hx of polyposis.  Follow up for annual eye exam.    Nocturia - Plan: PSA, continue oxybutynin.

## 2013-04-18 LAB — LIPID PANEL
Cholesterol: 160 mg/dL (ref 0–200)
HDL: 44 mg/dL (ref >39)
LDL Cholesterol: 97 mg/dL (ref 0–99)
Total CHOL/HDL Ratio: 3.6 Ratio
Triglycerides: 94 mg/dL (ref <150)
VLDL: 19 mg/dL (ref 0–40)

## 2013-04-18 LAB — COMPLETE METABOLIC PANEL WITH GFR
ALT: 16 U/L (ref 0–53)
AST: 15 U/L (ref 0–37)
Albumin: 4.1 g/dL (ref 3.5–5.2)
Alkaline Phosphatase: 80 U/L (ref 39–117)
BUN: 13 mg/dL (ref 6–23)
CO2: 27 mEq/L (ref 19–32)
Calcium: 9.5 mg/dL (ref 8.4–10.5)
Chloride: 104 mEq/L (ref 96–112)
Creat: 1.13 mg/dL (ref 0.50–1.35)
GFR, Est African American: 82 mL/min
GFR, Est Non African American: 71 mL/min
Glucose, Bld: 97 mg/dL (ref 70–99)
Potassium: 4.8 mEq/L (ref 3.5–5.3)
Sodium: 141 mEq/L (ref 135–145)
Total Bilirubin: 0.8 mg/dL (ref 0.3–1.2)
Total Protein: 7 g/dL (ref 6.0–8.3)

## 2013-04-18 LAB — PSA: PSA: 0.67 ng/mL (ref <=4.00)

## 2013-04-18 LAB — TSH: TSH: 1.423 u[IU]/mL (ref 0.350–4.500)

## 2013-04-22 ENCOUNTER — Ambulatory Visit: Payer: Medicare Other | Admitting: Physical Therapy

## 2013-04-23 NOTE — Telephone Encounter (Signed)
Spoke with the pt and notified of recs per MW He verbalized understanding  Order was sent to [facility] for ct chest to be done Pt aware that we will contact him with results once reviewed

## 2013-04-24 ENCOUNTER — Ambulatory Visit: Payer: Medicare Other | Admitting: Physical Therapy

## 2013-04-26 IMAGING — CR DG CHEST 2V
2 series · 2 of 2 positions shown · non-contrast
Comparison: 05/05/2008

CLINICAL DATA: Preoperative evaluation

CHEST - 2 VIEW

[view not recorded (1 of 2)]
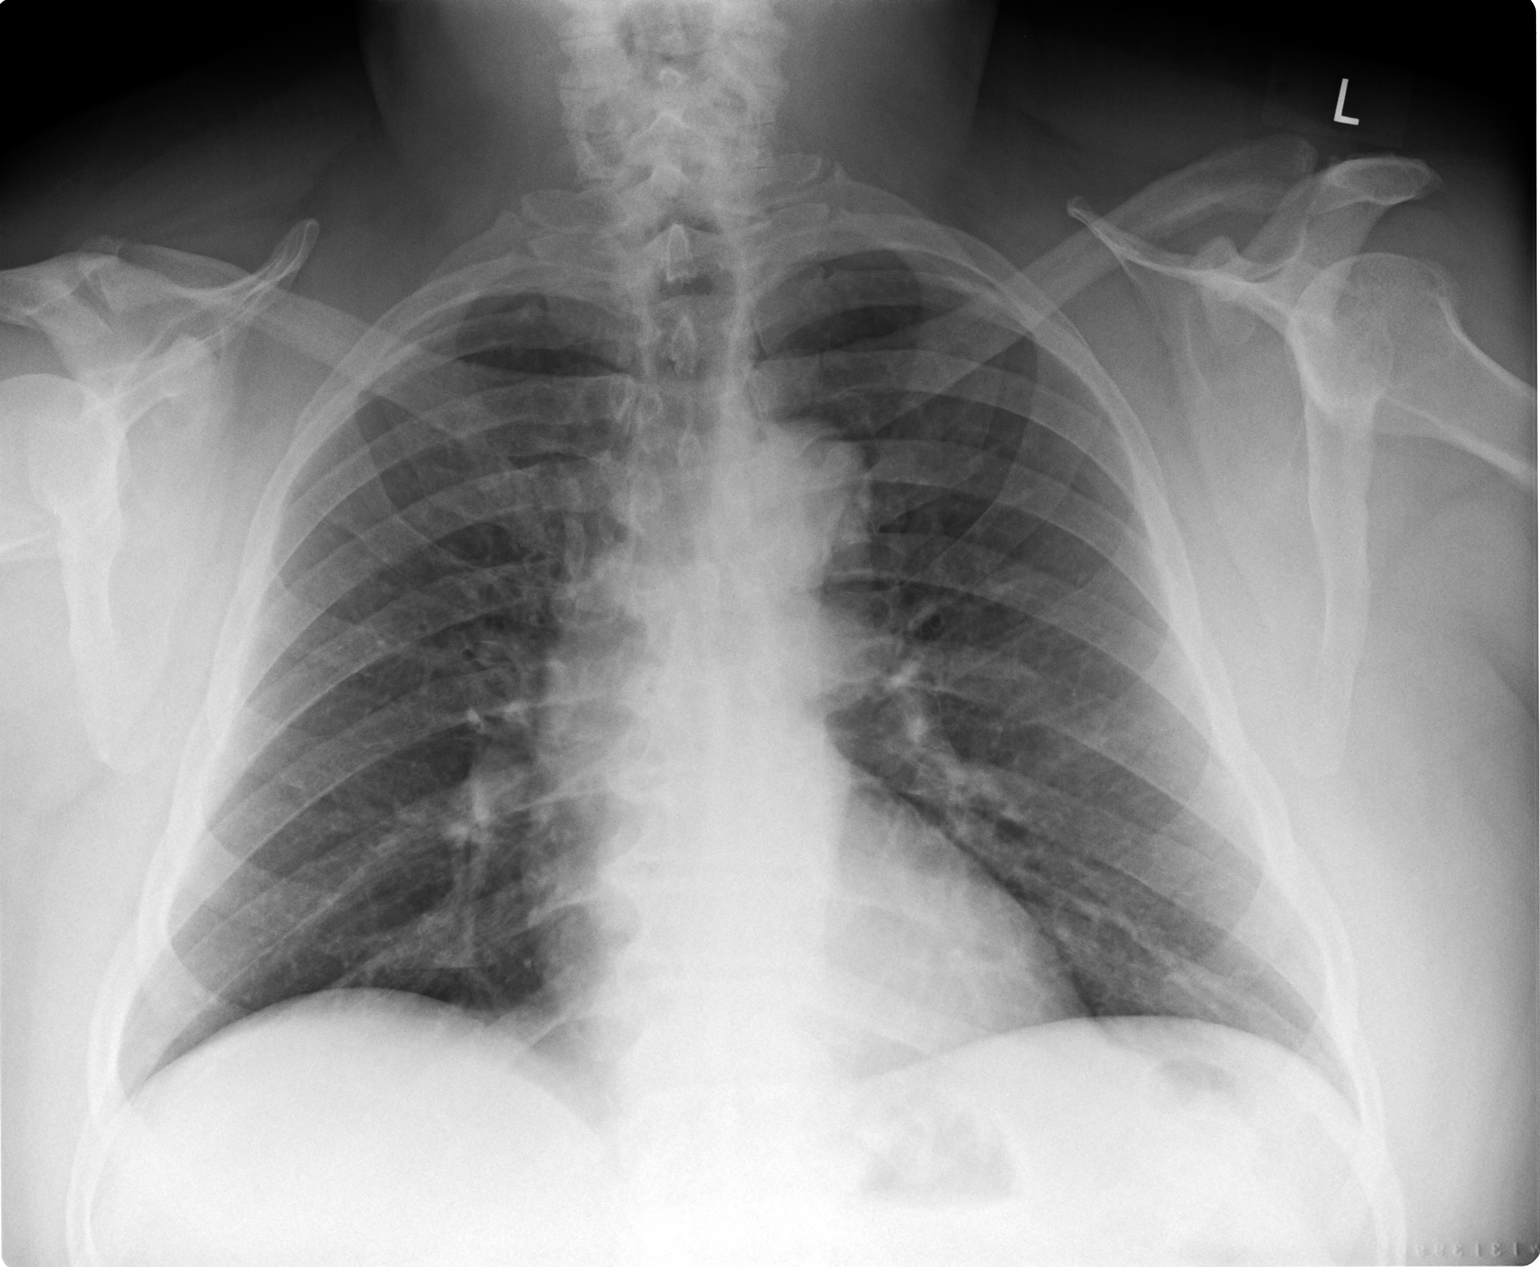

[view not recorded (2 of 2)]
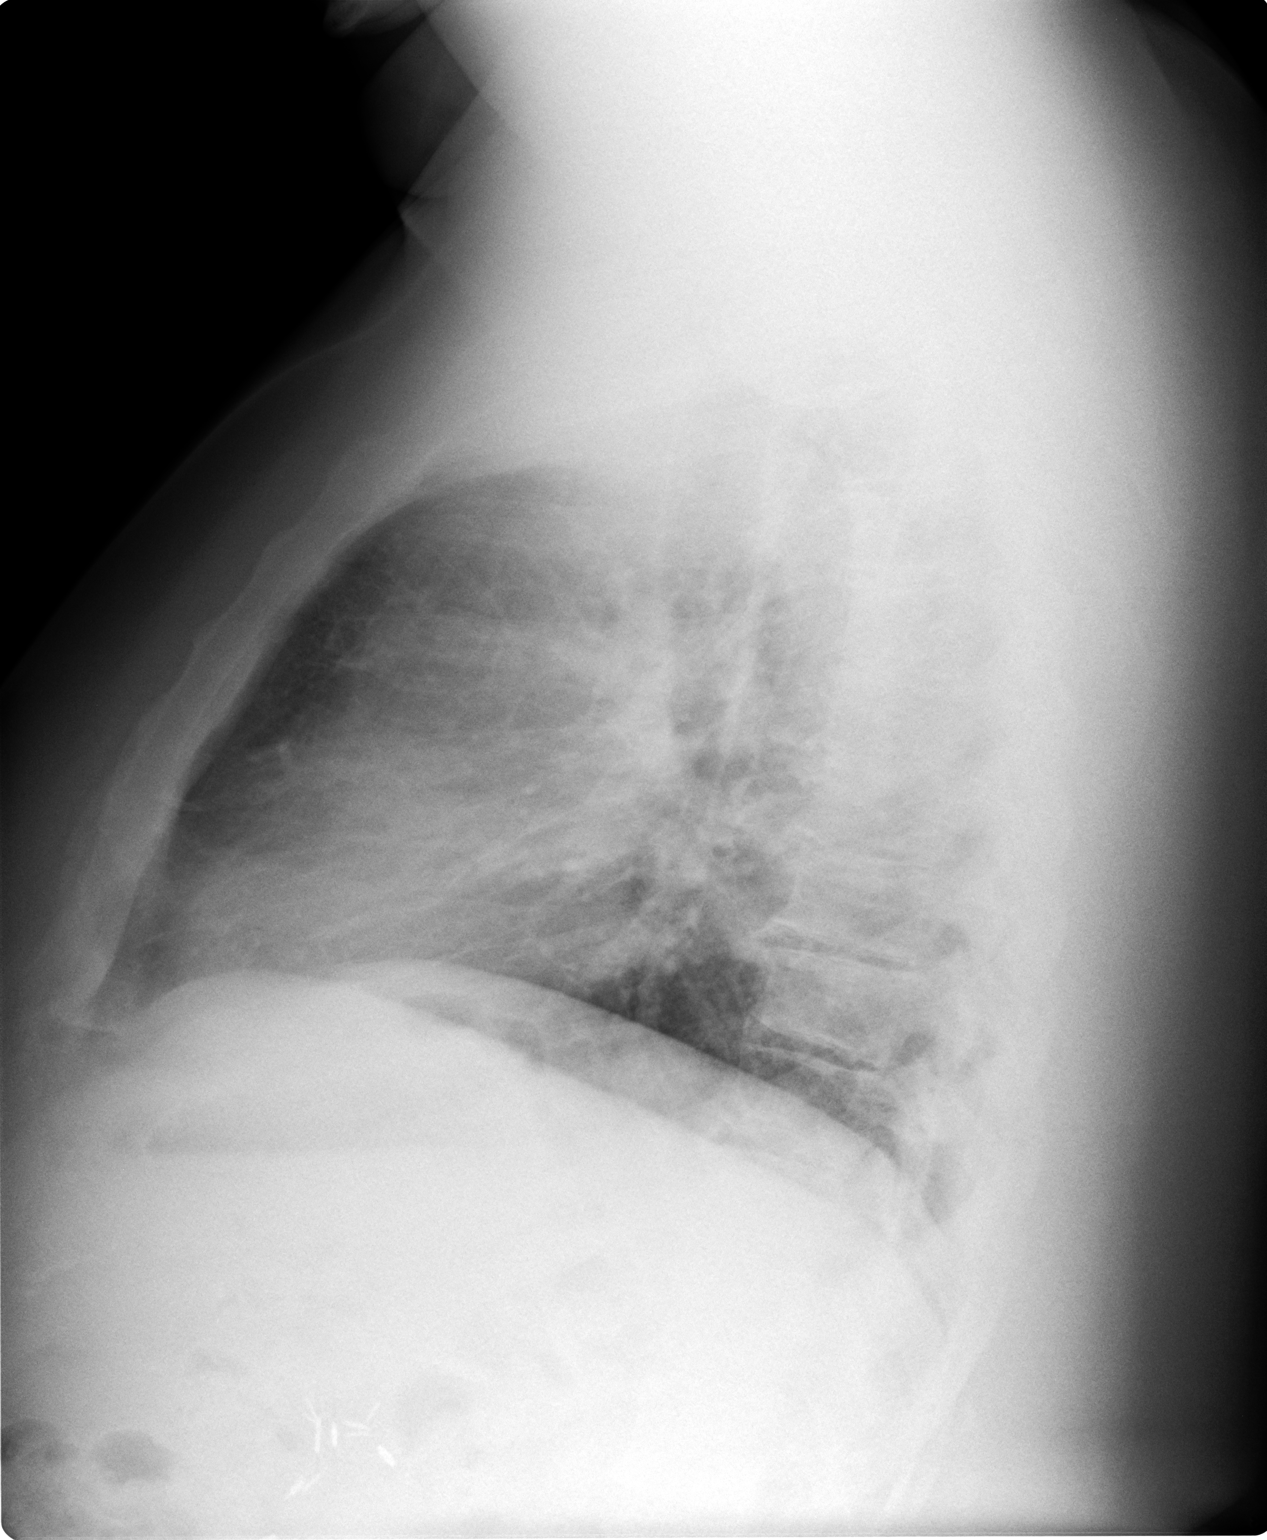

[2 of 2 positions shown; findings below may reference images not displayed]

FINDINGS: Normal heart size, mediastinal contours, and pulmonary vascularity.
Minimal chronic bronchitic changes.
Lungs otherwise clear.
No pleural effusion or pneumothorax.
Multilevel endplate spur formation thoracic spine.
IMPRESSION: Minimal chronic bronchitic changes.
No acute abnormalities.

## 2013-04-30 ENCOUNTER — Other Ambulatory Visit: Payer: Self-pay | Admitting: Family Medicine

## 2013-04-30 DIAGNOSIS — M159 Polyosteoarthritis, unspecified: Secondary | ICD-10-CM

## 2013-04-30 MED ORDER — MELOXICAM 7.5 MG PO TABS: 15.0000 mg | ORAL_TABLET | Freq: Every day | ORAL | Status: DC

## 2013-05-01 ENCOUNTER — Ambulatory Visit (INDEPENDENT_AMBULATORY_CARE_PROVIDER_SITE_OTHER)
Admission: RE | Admit: 2013-05-01 | Discharge: 2013-05-01 | Disposition: A | Discharge status: Home / Self Care | Payer: 59 | Source: Ambulatory Visit | Attending: Internal Medicine | Admitting: Internal Medicine

## 2013-05-01 DIAGNOSIS — J61 Pneumoconiosis due to asbestos and other mineral fibers: Secondary | ICD-10-CM | POA: Diagnosis not present

## 2013-05-02 ENCOUNTER — Encounter: Payer: Self-pay | Admitting: Internal Medicine

## 2013-05-02 DIAGNOSIS — R918 Other nonspecific abnormal finding of lung field: Secondary | ICD-10-CM | POA: Insufficient documentation

## 2013-05-02 NOTE — Progress Notes (Signed)
Quick Note:  Spoke with pt and notified of results per Dr. Wert. Pt verbalized understanding and denied any questions.  ______ 

## 2013-05-27 DIAGNOSIS — M545 Low back pain: Secondary | ICD-10-CM | POA: Diagnosis not present

## 2013-05-27 DIAGNOSIS — M47817 Spondylosis without myelopathy or radiculopathy, lumbosacral region: Secondary | ICD-10-CM | POA: Diagnosis not present

## 2013-06-03 DIAGNOSIS — H40059 Ocular hypertension, unspecified eye: Secondary | ICD-10-CM | POA: Diagnosis not present

## 2013-06-12 ENCOUNTER — Ambulatory Visit (INDEPENDENT_AMBULATORY_CARE_PROVIDER_SITE_OTHER): Payer: Worker's Compensation | Admitting: Internal Medicine

## 2013-06-12 ENCOUNTER — Encounter: Payer: Self-pay | Admitting: Internal Medicine

## 2013-06-12 VITALS — BP 128/68 | HR 69 | Temp 98.0°F | Ht 70.0 in | Wt 308.0 lb

## 2013-06-12 DIAGNOSIS — Z77090 Contact with and (suspected) exposure to asbestos: Secondary | ICD-10-CM

## 2013-06-12 DIAGNOSIS — R918 Other nonspecific abnormal finding of lung field: Secondary | ICD-10-CM

## 2013-06-12 DIAGNOSIS — R0609 Other forms of dyspnea: Secondary | ICD-10-CM

## 2013-06-12 DIAGNOSIS — R0600 Dyspnea, unspecified: Secondary | ICD-10-CM

## 2013-06-12 NOTE — Patient Instructions (Addendum)
Yearly chest x ray either here or with Dr Christell Constant but you definitely need to return for any persistent cough or short of breath.  Otherwise follow up here can be as needed  Late add:  CT chest 05/01/14 needed due to 8 mm nodule LLL> placed in tickle file

## 2013-06-12 NOTE — Progress Notes (Signed)
Subjective:    Patient ID: Adam Barrera, male    DOB: [birth year]  MRN: [id number]   Brief patient profile:  62 yobm quit smoking 09/2012 exposed to Asbestos at [employer] getting f/u at [facility] with dx of asbestosis reported but desired f/u closer to home>  Asbestosis not confirmed on CT 05/01/13   History of Present Illness  02/06/2012 1st pulmonary eval cc indolent onset x 5 years doe x heavy exertion(yardwork, up [place]) no longer working with baseline  wt = upper 200's more limited by knees than sob. No sign daytime cough. rec Please see patient coordinator before you leave today  to schedule CT chest in 6 weeks and office visit same day with pft's  The key is to stop smoking completely before smoking completely stops you!     03/27/2012 f/u ov/Tresea Heine cc no change doe, really more limited by breathing.   No unusual cough, purulent sputum or sinus/hb symptoms on present rx. rec The key is to stop smoking completely before smoking completely stops you!   Follow up will be arranged in one year with CT Chest - call sooner if worse short of breath or cough.  06/12/2013 f/u ov/Graeme Menees re: ? Asbestosis (not present by CT) Chief Complaint  Patient presents with  . Follow-up    Pt here to discuss last ct chest. He denies any respiratory co's today.    no limiting doe   No obvious daytime variabilty or assoc chronic cough or cp or chest tightness, subjective wheeze.    Sleeping ok without nocturnal  or early am exacerbation  of respiratory  c/o's or need for noct saba. Also denies any obvious fluctuation of symptoms with weather or environmental changes or other aggravating or alleviating factors except as outlined above   ROS  The following are not active complaints unless bolded sore throat, dysphagia, dental problems, itching, sneezing,  nasal congestion or excess/ purulent secretions, ear ache,   fever, chills, sweats, unintended wt loss, pleuritic or exertional cp, hemoptysis,  orthopnea  pnd or leg swelling, presyncope, palpitations, heartburn, abdominal pain, anorexia, nausea, vomiting, diarrhea  or change in bowel or urinary habits, change in stools or urine, dysuria,hematuria,  rash, arthralgias, visual complaints, headache, numbness weakness or ataxia or problems with walking or coordination,  change in mood/affect or memory.             Objective:   Physical Exam  amb obese bm nad     Wt 302 02/06/12 > 03/27/2012  302 > 06/12/2013  308   HEENT: edentulous with dentures in place, turbinates, and orophanx. Nl external ear canals without cough reflex   NECK :  without JVD/Nodes/TM/ nl carotid upstrokes bilaterally   LUNGS: no acc muscle use, clear to A and P bilaterally without cough on insp or exp maneuvers   CV:  RRR  no s3 or murmur or increase in P2,  Pos bilateral pitting LE edema   ABD:  soft and nontender with nl excursion in the supine position. No bruits or organomegaly, bowel sounds nl  MS:  warm without deformities, calf tenderness, cyanosis or clubbing    Chest CT  05/01/13  Stable nodular opacities compared to prior study.  There is a degree of underlying centrilobular emphysema. Most of  the patchy ground glass opacity seen previously is clear. No new  opacity this nature seen. No pleural plaques or diaphragmatic  calcification identified. New para there is coronary artery  calcification.  No adenopathy.  Given the presence of an 8 mm nodular opacity on the left, an  additional 1 year CT surveillance may be reasonable. Stability  after a 2-year time span should be presumptive evidence of benign  etiology unless there is known underlying neoplasia elsewhere  .        Assessment & Plan:

## 2013-06-13 NOTE — Assessment & Plan Note (Signed)
-   Chest CT 03/27/12 and 05/01/13 - in tickle file for recall 05/01/14      Unclear from his hx when his last significant exposure was, but the most impt aspect of his care at this point is maintaining smoking abstinence

## 2013-06-13 NOTE — Assessment & Plan Note (Signed)
-   CT chest 05/01/13 : \ Stable nodular opacities compared to prior study.  There is a degree of underlying centrilobular emphysema. Most of  the patchy ground glass opacity seen previously is clear. No new  opacity this nature seen. No pleural plaques or diaphragmatic  calcification identified. New para there is coronary artery  calcification.  No adenopathy.  Given the presence of an 8 mm nodular opacity on the left, an  additional 1 year CT surveillance may be reasonable. Stability  after a 2-year time span should be presumptive evidence of benign  etiology unless there is known underlying neoplasia elsewhere > tickle file for recall 05/01/14 .  Given his smoking and asbestos hx it is reasonable to do one more CT in a year to f/u the nodule then yearly cxr's thereafter (radiation exposure a concern with more aggressive f/u reviewed with pt)

## 2013-06-13 NOTE — Assessment & Plan Note (Signed)
-   PFT's wnl x ERV 17% 03/27/2012   Reviewed main finding on pft's is obesity effect, not copd s/p successful smoking cessation in 09/2012 reported

## 2013-06-16 ENCOUNTER — Other Ambulatory Visit: Payer: Self-pay | Admitting: Family Medicine

## 2013-06-16 DIAGNOSIS — M159 Polyosteoarthritis, unspecified: Secondary | ICD-10-CM

## 2013-06-16 MED ORDER — MELOXICAM 7.5 MG PO TABS: 15.0000 mg | ORAL_TABLET | Freq: Every day | ORAL | Status: DC

## 2013-06-23 ENCOUNTER — Telehealth: Payer: Self-pay | Admitting: *Deleted

## 2013-06-23 NOTE — Telephone Encounter (Addendum)
Message copied by Baltazar Apo on Mon Jun 23, 2013  8:11 AM ------      Message from: Deatra Canter      Created: Mon Jun 16, 2013 11:27 AM       I sent in a new rx for meloxicam ------pt notified

## 2013-09-10 DIAGNOSIS — M545 Low back pain: Secondary | ICD-10-CM | POA: Diagnosis not present

## 2013-09-10 DIAGNOSIS — M1710 Unilateral primary osteoarthritis, unspecified knee: Secondary | ICD-10-CM | POA: Diagnosis not present

## 2013-09-15 ENCOUNTER — Other Ambulatory Visit: Payer: Self-pay | Admitting: Family Medicine

## 2013-10-21 ENCOUNTER — Encounter: Payer: Self-pay | Admitting: Family Medicine

## 2013-10-21 ENCOUNTER — Ambulatory Visit (INDEPENDENT_AMBULATORY_CARE_PROVIDER_SITE_OTHER): Payer: Medicare Other | Admitting: Family Medicine

## 2013-10-21 VITALS — BP 108/71 | HR 75 | Temp 98.1°F | Ht 70.0 in | Wt 310.0 lb

## 2013-10-21 DIAGNOSIS — I10 Essential (primary) hypertension: Secondary | ICD-10-CM

## 2013-10-21 LAB — POCT CBC
Granulocyte percent: 73.9 %G (ref 37–80)
HCT, POC: 43.7 % (ref 43.5–53.7)
Hemoglobin: 13.4 g/dL — AB (ref 14.1–18.1)
Lymph, poc: 1.4 (ref 0.6–3.4)
MCH, POC: 28 pg (ref 27–31.2)
MCHC: 30.7 g/dL — AB (ref 31.8–35.4)
MCV: 91 fL (ref 80–97)
MPV: 7.9 fL (ref 0–99.8)
POC Granulocyte: 4.5 (ref 2–6.9)
POC LYMPH PERCENT: 22.9 %L (ref 10–50)
Platelet Count, POC: 178 10*3/uL (ref 142–424)
RBC: 4.8 M/uL (ref 4.69–6.13)
RDW, POC: 14.7 %
WBC: 6.1 10*3/uL (ref 4.6–10.2)

## 2013-10-21 NOTE — Progress Notes (Signed)
   Subjective:    Patient ID: Adam Barrera, male    DOB: 1951-01-04, 63 y.o.   MRN: [id number]  HPI This 63 y.o. male presents for evaluation of hypertension, hyperlipidemia, and arthritis. He is doing fine without any acute changes.   Review of Systems No chest pain, SOB, HA, dizziness, vision change, N/V, diarrhea, constipation, dysuria, urinary urgency or frequency, myalgias, arthralgias or rash.     Objective:   Physical Exam Vital signs noted  Well developed well nourished male.  HEENT - Head atraumatic Normocephalic                Eyes - PERRLA, Conjuctiva - clear Sclera- Clear EOMI                Ears - EAC's Wnl TM's Wnl Gross Hearing WNL                Nose - Nares patent                 Throat - oropharanx wnl Respiratory - Lungs CTA bilateral Cardiac - RRR S1 and S2 without murmur GI - Abdomen soft Nontender and bowel sounds active x 4 Extremities - No edema. Neuro - Grossly intact.       Assessment & Plan:  Essential hypertension, benign - Plan: POCT CBC, CMP14+EGFR  Follow up in 6 months  Lysbeth Penner FNP

## 2013-10-21 NOTE — Patient Instructions (Addendum)
Check with insurance company about coverage of Tdap and Zostavax vaccination.

## 2013-10-22 LAB — CMP14+EGFR
ALT: 17 IU/L (ref 0–44)
AST: 16 IU/L (ref 0–40)
Albumin/Globulin Ratio: 1.6 (ref 1.1–2.5)
Albumin: 4.1 g/dL (ref 3.6–4.8)
Alkaline Phosphatase: 74 IU/L (ref 39–117)
BUN/Creatinine Ratio: 7 — ABNORMAL LOW (ref 10–22)
BUN: 8 mg/dL (ref 8–27)
CO2: 26 mmol/L (ref 18–29)
Calcium: 9.3 mg/dL (ref 8.6–10.2)
Chloride: 101 mmol/L (ref 97–108)
Creatinine, Ser: 1.15 mg/dL (ref 0.76–1.27)
GFR calc Af Amer: 80 mL/min/{1.73_m2} (ref >59)
GFR calc non Af Amer: 69 mL/min/{1.73_m2} (ref >59)
Globulin, Total: 2.6 g/dL (ref 1.5–4.5)
Glucose: 148 mg/dL — ABNORMAL HIGH (ref 65–99)
Potassium: 4.7 mmol/L (ref 3.5–5.2)
Sodium: 140 mmol/L (ref 134–144)
Total Bilirubin: 0.7 mg/dL (ref 0.0–1.2)
Total Protein: 6.7 g/dL (ref 6.0–8.5)

## 2013-10-28 ENCOUNTER — Telehealth: Payer: Self-pay | Admitting: Family Medicine

## 2013-10-28 NOTE — Telephone Encounter (Signed)
PATIENT AWARE

## 2013-10-28 NOTE — Telephone Encounter (Signed)
CAN WE PLEASE ADD HGA1C

## 2013-10-29 ENCOUNTER — Other Ambulatory Visit: Payer: Self-pay | Admitting: Family Medicine

## 2013-10-29 DIAGNOSIS — R739 Hyperglycemia, unspecified: Secondary | ICD-10-CM

## 2013-10-29 NOTE — Telephone Encounter (Signed)
hgbaic ordered for elevated blood sugar

## 2014-01-27 DIAGNOSIS — M47817 Spondylosis without myelopathy or radiculopathy, lumbosacral region: Secondary | ICD-10-CM | POA: Diagnosis not present

## 2014-01-27 DIAGNOSIS — M545 Low back pain, unspecified: Secondary | ICD-10-CM | POA: Diagnosis not present

## 2014-04-14 ENCOUNTER — Telehealth: Payer: Self-pay | Admitting: *Deleted

## 2014-04-14 DIAGNOSIS — R918 Other nonspecific abnormal finding of lung field: Secondary | ICD-10-CM

## 2014-04-14 NOTE — Telephone Encounter (Signed)
Message copied by Rosana Berger on Tue Apr 14, 2014  3:00 PM ------      Message from: Christinia Gully B      Created: Fri Jun 13, 2013  8:18 AM       Needs CT chest no contrast f/u MPN ------

## 2014-04-14 NOTE — Telephone Encounter (Signed)
Spoke with the pt and reminded him about ct that is due in Aug  He verbalized understanding  Order was sent to [facility]

## 2014-04-21 ENCOUNTER — Ambulatory Visit (INDEPENDENT_AMBULATORY_CARE_PROVIDER_SITE_OTHER): Payer: Medicare Other | Admitting: Family Medicine

## 2014-04-21 ENCOUNTER — Encounter: Payer: Self-pay | Admitting: Family Medicine

## 2014-04-21 VITALS — BP 114/75 | HR 69 | Temp 98.1°F | Ht 70.0 in | Wt 302.4 lb

## 2014-04-21 DIAGNOSIS — Z139 Encounter for screening, unspecified: Secondary | ICD-10-CM

## 2014-04-21 DIAGNOSIS — E785 Hyperlipidemia, unspecified: Secondary | ICD-10-CM

## 2014-04-21 DIAGNOSIS — I10 Essential (primary) hypertension: Secondary | ICD-10-CM

## 2014-04-21 DIAGNOSIS — N400 Enlarged prostate without lower urinary tract symptoms: Secondary | ICD-10-CM

## 2014-04-21 DIAGNOSIS — R350 Frequency of micturition: Secondary | ICD-10-CM | POA: Diagnosis not present

## 2014-04-21 DIAGNOSIS — M159 Polyosteoarthritis, unspecified: Secondary | ICD-10-CM

## 2014-04-21 LAB — POCT CBC
Granulocyte percent: 71.1 %G (ref 37–80)
HCT, POC: 44.1 % (ref 43.5–53.7)
Hemoglobin: 14.2 g/dL (ref 14.1–18.1)
Lymph, poc: 1.5 (ref 0.6–3.4)
MCH, POC: 29.2 pg (ref 27–31.2)
MCHC: 32.2 g/dL (ref 31.8–35.4)
MCV: 90.4 fL (ref 80–97)
MPV: 8.1 fL (ref 0–99.8)
POC Granulocyte: 4.6 (ref 2–6.9)
POC LYMPH PERCENT: 23.7 %L (ref 10–50)
Platelet Count, POC: 191 10*3/uL (ref 142–424)
RBC: 4.9 M/uL (ref 4.69–6.13)
RDW, POC: 14.2 %
WBC: 6.5 10*3/uL (ref 4.6–10.2)

## 2014-04-21 MED ORDER — OXYBUTYNIN CHLORIDE 5 MG PO TABS: 5.0000 mg | ORAL_TABLET | Freq: Two times a day (BID) | ORAL | Status: DC

## 2014-04-21 MED ORDER — MELOXICAM 7.5 MG PO TABS: 15.0000 mg | ORAL_TABLET | Freq: Every day | ORAL | Status: DC

## 2014-04-21 MED ORDER — PRAVASTATIN SODIUM 40 MG PO TABS: 40.0000 mg | ORAL_TABLET | Freq: Every day | ORAL | Status: DC

## 2014-04-21 MED ORDER — LOSARTAN POTASSIUM 100 MG PO TABS: 100.0000 mg | ORAL_TABLET | Freq: Every day | ORAL | Status: DC

## 2014-04-21 MED ORDER — NIACIN 500 MG PO TABS: 500.0000 mg | ORAL_TABLET | Freq: Every day | ORAL | Status: DC

## 2014-04-21 NOTE — Progress Notes (Signed)
   Subjective:    Patient ID: Adam Barrera, male    DOB: 11/09/1950, 63 y.o.   MRN: [id number]  HPI  This 63 y.o. male presents for evaluation of routine follow up.  He has hx of OA, HTN, hyperlipidemia, And MO.  He has no acute problems.  Review of Systems    No chest pain, SOB, HA, dizziness, vision change, N/V, diarrhea, constipation, dysuria, urinary urgency or frequency, myalgias, arthralgias or rash.  Objective:   Physical Exam Vital signs noted  Well developed well nourished male.  HEENT - Head atraumatic Normocephalic                Eyes - PERRLA, Conjuctiva - clear Sclera- Clear EOMI                Ears - EAC's Wnl TM's Wnl Gross Hearing WNL                Nose - Nares patent                 Throat - oropharanx wnl Respiratory - Lungs CTA bilateral Cardiac - RRR S1 and S2 without murmur GI - Abdomen soft Nontender and bowel sounds active x 4 Extremities - No edema. Neuro - Grossly intact.       Assessment & Plan:  Hyperlipemia - Plan: CMP14+EGFR, Lipid panel, pravastatin (PRAVACHOL) 40 MG tablet, niacin 500 MG tablet  Essential hypertension - Plan: POCT CBC, CMP14+EGFR  Screening - Plan: PSA, total and free, Thyroid Panel With TSH  Urinary frequency  - Plan: PSA, total and free  Essential hypertension, benign - Plan: losartan (COZAAR) 100 MG tablet  BPH (benign prostatic hyperplasia) - Plan: oxybutynin (DITROPAN) 5 MG tablet  Other and unspecified hyperlipidemia - Plan: pravastatin (PRAVACHOL) 40 MG tablet, niacin 500 MG tablet  Generalized OA - Plan: meloxicam (MOBIC) 7.5 MG tablet  Follow up in 6 months  Dresser FNP

## 2014-04-22 LAB — CMP14+EGFR
ALT: 14 IU/L (ref 0–44)
AST: 14 IU/L (ref 0–40)
Albumin/Globulin Ratio: 1.7 (ref 1.1–2.5)
Albumin: 4.1 g/dL (ref 3.6–4.8)
Alkaline Phosphatase: 79 IU/L (ref 39–117)
BUN/Creatinine Ratio: 10 (ref 10–22)
BUN: 12 mg/dL (ref 8–27)
CO2: 25 mmol/L (ref 18–29)
Calcium: 9.4 mg/dL (ref 8.6–10.2)
Chloride: 103 mmol/L (ref 97–108)
Creatinine, Ser: 1.2 mg/dL (ref 0.76–1.27)
GFR calc Af Amer: 76 mL/min/{1.73_m2} (ref >59)
GFR calc non Af Amer: 65 mL/min/{1.73_m2} (ref >59)
Globulin, Total: 2.4 g/dL (ref 1.5–4.5)
Glucose: 125 mg/dL — ABNORMAL HIGH (ref 65–99)
Potassium: 4.7 mmol/L (ref 3.5–5.2)
Sodium: 142 mmol/L (ref 134–144)
Total Bilirubin: 0.6 mg/dL (ref 0.0–1.2)
Total Protein: 6.5 g/dL (ref 6.0–8.5)

## 2014-04-22 LAB — PSA, TOTAL AND FREE
PSA, Free Pct: 41.4 %
PSA, Free: 0.29 ng/mL
PSA: 0.7 ng/mL (ref 0.0–4.0)

## 2014-04-22 LAB — THYROID PANEL WITH TSH
Free Thyroxine Index: 3 (ref 1.2–4.9)
T3 Uptake Ratio: 22 % — ABNORMAL LOW (ref 24–39)
T4, Total: 13.8 ug/dL — ABNORMAL HIGH (ref 4.5–12.0)
TSH: 1.64 u[IU]/mL (ref 0.450–4.500)

## 2014-04-22 LAB — LIPID PANEL
Chol/HDL Ratio: 4.2 ratio units (ref 0.0–5.0)
Cholesterol, Total: 147 mg/dL (ref 100–199)
HDL: 35 mg/dL — ABNORMAL LOW (ref >39)
LDL Calculated: 91 mg/dL (ref 0–99)
Triglycerides: 107 mg/dL (ref 0–149)
VLDL Cholesterol Cal: 21 mg/dL (ref 5–40)

## 2014-04-26 ENCOUNTER — Other Ambulatory Visit: Payer: Self-pay | Admitting: Family Medicine

## 2014-05-05 ENCOUNTER — Ambulatory Visit (INDEPENDENT_AMBULATORY_CARE_PROVIDER_SITE_OTHER)
Admission: RE | Admit: 2014-05-05 | Discharge: 2014-05-05 | Disposition: A | Discharge status: Home / Self Care | Payer: Worker's Compensation | Source: Ambulatory Visit | Attending: Internal Medicine | Admitting: Internal Medicine

## 2014-05-05 ENCOUNTER — Encounter: Payer: Self-pay | Admitting: Internal Medicine

## 2014-05-05 DIAGNOSIS — R918 Other nonspecific abnormal finding of lung field: Secondary | ICD-10-CM

## 2014-05-07 NOTE — Progress Notes (Signed)
Quick Note:  Spoke with pt and notified of results per Dr. Wert. Pt verbalized understanding and denied any questions.  ______ 

## 2014-07-28 ENCOUNTER — Other Ambulatory Visit: Payer: Self-pay | Admitting: Gastroenterology

## 2014-07-28 DIAGNOSIS — D123 Benign neoplasm of transverse colon: Secondary | ICD-10-CM | POA: Diagnosis not present

## 2014-07-28 DIAGNOSIS — Z09 Encounter for follow-up examination after completed treatment for conditions other than malignant neoplasm: Secondary | ICD-10-CM | POA: Diagnosis not present

## 2014-07-28 DIAGNOSIS — K5730 Diverticulosis of large intestine without perforation or abscess without bleeding: Secondary | ICD-10-CM | POA: Diagnosis not present

## 2014-07-28 DIAGNOSIS — Z86010 Personal history of colonic polyps: Secondary | ICD-10-CM | POA: Diagnosis not present

## 2014-08-17 ENCOUNTER — Encounter: Payer: Self-pay | Admitting: *Deleted

## 2014-09-02 DIAGNOSIS — M19012 Primary osteoarthritis, left shoulder: Secondary | ICD-10-CM | POA: Diagnosis not present

## 2014-09-02 DIAGNOSIS — M1711 Unilateral primary osteoarthritis, right knee: Secondary | ICD-10-CM | POA: Diagnosis not present

## 2014-09-29 DIAGNOSIS — M545 Low back pain: Secondary | ICD-10-CM | POA: Diagnosis not present

## 2014-09-29 DIAGNOSIS — M47817 Spondylosis without myelopathy or radiculopathy, lumbosacral region: Secondary | ICD-10-CM | POA: Diagnosis not present

## 2014-10-06 DIAGNOSIS — M545 Low back pain: Secondary | ICD-10-CM | POA: Diagnosis not present

## 2014-10-06 DIAGNOSIS — M47817 Spondylosis without myelopathy or radiculopathy, lumbosacral region: Secondary | ICD-10-CM | POA: Diagnosis not present

## 2014-10-22 ENCOUNTER — Ambulatory Visit: Payer: 59 | Admitting: Family Medicine

## 2014-10-23 ENCOUNTER — Ambulatory Visit: Payer: 59 | Admitting: Family

## 2014-10-27 ENCOUNTER — Ambulatory Visit: Payer: Medicare Other | Admitting: Family

## 2015-04-13 ENCOUNTER — Encounter: Payer: Self-pay | Admitting: Family Medicine

## 2015-04-13 ENCOUNTER — Ambulatory Visit (INDEPENDENT_AMBULATORY_CARE_PROVIDER_SITE_OTHER): Payer: Medicare Other | Admitting: Family Medicine

## 2015-04-13 VITALS — BP 121/68 | HR 58 | Temp 97.3°F | Ht 70.0 in | Wt 301.0 lb

## 2015-04-13 DIAGNOSIS — E785 Hyperlipidemia, unspecified: Secondary | ICD-10-CM | POA: Diagnosis not present

## 2015-04-13 DIAGNOSIS — I10 Essential (primary) hypertension: Secondary | ICD-10-CM | POA: Diagnosis not present

## 2015-04-13 DIAGNOSIS — N400 Enlarged prostate without lower urinary tract symptoms: Secondary | ICD-10-CM | POA: Diagnosis not present

## 2015-04-13 MED ORDER — PRAVASTATIN SODIUM 40 MG PO TABS: 40.0000 mg | ORAL_TABLET | Freq: Every day | ORAL | Status: DC

## 2015-04-13 MED ORDER — LOSARTAN POTASSIUM 100 MG PO TABS
100.0000 mg | ORAL_TABLET | Freq: Every day | ORAL | Status: DC
Start: 2015-04-13 — End: 2016-07-05

## 2015-04-13 MED ORDER — NIACIN 500 MG PO TABS: 500.0000 mg | ORAL_TABLET | Freq: Every day | ORAL | Status: DC

## 2015-04-13 MED ORDER — OXYBUTYNIN CHLORIDE 5 MG PO TABS: 5.0000 mg | ORAL_TABLET | Freq: Two times a day (BID) | ORAL | Status: DC

## 2015-04-13 NOTE — Patient Instructions (Signed)
Medicare Annual Wellness Visit  [place] and the medical providers at [facility] strive to bring you the best medical care.  In doing so we not only want to address your current medical conditions and concerns but also to detect new conditions early and prevent illness, disease and health-related problems.    Medicare offers a yearly Wellness Visit which allows our clinical staff to assess your need for preventative services including immunizations, lifestyle education, counseling to decrease risk of preventable diseases and screening for fall risk and other medical concerns.    This visit is provided free of charge (no copay) for all Medicare recipients. The clinical pharmacists at [facility] have begun to conduct these Wellness Visits which will also include a thorough review of all your medications.    As you primary medical provider recommend that you make an appointment for your Annual Wellness Visit if you have not done so already this year.  You may set up this appointment before you leave today or you may call back ([phone]) and schedule an appointment.  Please make sure when you call that you mention that you are scheduling your Annual Wellness Visit with the clinical pharmacist so that the appointment may be made for the proper length of time.     Continue current medications. Continue good therapeutic lifestyle changes which include good diet and exercise. Fall precautions discussed with patient. If an FOBT was given today- please return it to our front desk. If you are over 50 years old - you may need Prevnar 13 or the adult Pneumonia vaccine.  Flu Shots are still available at our office. If you still haven't had one please call to set up a nurse visit to get one.   After your visit with us today you will receive a survey in the mail or online from [employer] regarding your care with us. Please take a moment to  fill this out. Your feedback is very important to us as you can help us better understand your patient needs as well as improve your experience and satisfaction. WE CARE ABOUT YOU!!!   

## 2015-04-13 NOTE — Progress Notes (Signed)
Subjective:    Patient ID: Adam Barrera, male    DOB: January 08, 1951, 64 y.o.   MRN: [id number]  HPI Pt here for follow up and management of chronic medical problems which includes hypertension and hyperlipidemia. He is taking medications regularly.  He has several concerns: First he asked about med for erectile dysfunction. He has never had medicine for this. I explained that it was likely multifactorial related to age, blood pressure medicine,: Also has a lesion on his right thumb that he asked about      Patient Active Problem List   Diagnosis Date Noted  . Multiple pulmonary nodules 05/02/2013  . Osteoarthritis of left knee 01/16/2013  . Hyperglycemia 01/16/2013  . Dyspnea 03/27/2012  . Smoker 02/08/2012  . Asbestos exposure s evidence asbestosis 02/06/2012  . DYSLIPIDEMIA 05/04/2009  . OBESITY, UNSPECIFIED 05/04/2009  . HYPERTENSION 05/04/2009   Outpatient Encounter Prescriptions as of 04/13/2015  Medication Sig  . Flaxseed MISC by Does not apply route.  . Ginger, Zingiber officinalis, (GINGER PO) Take by mouth.  . losartan (COZAAR) 100 MG tablet Take 1 tablet (100 mg total) by mouth at bedtime.  . meloxicam (MOBIC) 7.5 MG tablet Take 2 tablets (15 mg total) by mouth daily.  . niacin 500 MG tablet Take 1 tablet (500 mg total) by mouth at bedtime.  Marland Kitchen oxybutynin (DITROPAN) 5 MG tablet Take 1 tablet (5 mg total) by mouth 2 (two) times daily.  . pravastatin (PRAVACHOL) 40 MG tablet Take 1 tablet (40 mg total) by mouth at bedtime.  . TURMERIC PO Take by mouth.  Marland Kitchen UNABLE TO FIND Med Name: cinnamon, magnesium, and fish oil -- all OTC   No facility-administered encounter medications on file as of 04/13/2015.      Review of Systems  Constitutional: Negative.   HENT: Negative.   Eyes: Negative.   Respiratory: Negative.   Cardiovascular: Negative.   Gastrointestinal: Negative.   Endocrine: Negative.   Genitourinary: Negative.        Discuss male enhancement  Musculoskeletal:  Negative.   Skin: Negative.        Right thumb skin lesion  Itching all over - off/ on  Allergic/Immunologic: Negative.   Neurological: Negative.   Hematological: Negative.   Psychiatric/Behavioral: Negative.        Objective:   Physical Exam  Constitutional: He is oriented to person, place, and time. He appears well-developed and well-nourished.  Patient is obese  HENT:  Head: Normocephalic and atraumatic.  Eyes: Pupils are equal, round, and reactive to light.  Cardiovascular: Normal rate and regular rhythm.   Pulmonary/Chest: Effort normal and breath sounds normal.  Abdominal: Soft.  Genitourinary: Prostate normal.  Neurological: He is alert and oriented to person, place, and time. He has normal reflexes.  Skin:  Verruca's lesion on right thumb was frozen with liquid nitrogen. He was told what to expect over the next 1-2 weeks   BP 121/68 mmHg  Pulse 58  Temp(Src) 97.3 F (36.3 C) (Oral)  Ht '5\' 10"'  (1.778 m)  Wt 301 lb (136.533 kg)  BMI 43.19 kg/m2        Assessment & Plan:  1. Hyperlipemia Continue pravastatin but he has been itching and I suggested to hold niacin for 2 weeks to see if that might help - Lipid panel - pravastatin (PRAVACHOL) 40 MG tablet; Take 1 tablet (40 mg total) by mouth at bedtime.  Dispense: 90 tablet; Refill: 3 - niacin 500 MG tablet; Take 1 tablet (500 mg  total) by mouth at bedtime.  Dispense: 90 tablet; Refill: 3  2. Essential hypertension A pressures are doing great. He should continue on same medicine - CMP14+EGFR  3. BPH (benign prostatic hyperplasia) On exam of prostate there are no nodules will check PSA. He does have positive family history - PSA - oxybutynin (DITROPAN) 5 MG tablet; Take 1 tablet (5 mg total) by mouth 2 (two) times daily.  Dispense: 180 tablet; Refill: 3  4. Essential hypertension, benign    - losartan (COZAAR) 100 MG tablet; Take 1 tablet (100 mg total) by mouth at bedtime.  Dispense: 90 tablet; Refill:  3  5. Hyperlipidemia  - pravastatin (PRAVACHOL) 40 MG tablet; Take 1 tablet (40 mg total) by mouth at bedtime.   Dispense: 90 tablet; Refill: 3 - niacin 500 MG tablet; Take 1 tablet (500 mg total) by mouth at bedtime.  Dispense: 90 tablet; Refill: 3   Wardell Honour MD

## 2015-04-14 LAB — CMP14+EGFR
ALK PHOS: 75 IU/L (ref 39–117)
ALT: 19 IU/L (ref 0–44)
AST: 21 IU/L (ref 0–40)
Albumin/Globulin Ratio: 1.4 (ref 1.1–2.5)
Albumin: 3.9 g/dL (ref 3.6–4.8)
BUN / CREAT RATIO: 7 — AB (ref 10–22)
BUN: 8 mg/dL (ref 8–27)
Bilirubin Total: 0.7 mg/dL (ref 0.0–1.2)
CHLORIDE: 101 mmol/L (ref 97–108)
CO2: 25 mmol/L (ref 18–29)
Calcium: 9.1 mg/dL (ref 8.6–10.2)
Creatinine, Ser: 1.09 mg/dL (ref 0.76–1.27)
GFR calc Af Amer: 84 mL/min/{1.73_m2} (ref >59)
GFR calc non Af Amer: 73 mL/min/{1.73_m2} (ref >59)
GLUCOSE: 90 mg/dL (ref 65–99)
Globulin, Total: 2.7 g/dL (ref 1.5–4.5)
Potassium: 4.7 mmol/L (ref 3.5–5.2)
Sodium: 143 mmol/L (ref 134–144)
Total Protein: 6.6 g/dL (ref 6.0–8.5)

## 2015-04-14 LAB — LIPID PANEL
Chol/HDL Ratio: 4.5 ratio units (ref 0.0–5.0)
Cholesterol, Total: 162 mg/dL (ref 100–199)
HDL: 36 mg/dL — ABNORMAL LOW (ref >39)
LDL Calculated: 97 mg/dL (ref 0–99)
TRIGLYCERIDES: 143 mg/dL (ref 0–149)
VLDL Cholesterol Cal: 29 mg/dL (ref 5–40)

## 2015-04-14 LAB — PSA: Prostate Specific Ag, Serum: 0.7 ng/mL (ref 0.0–4.0)

## 2015-05-13 DIAGNOSIS — M25512 Pain in left shoulder: Secondary | ICD-10-CM | POA: Diagnosis not present

## 2015-06-04 ENCOUNTER — Other Ambulatory Visit: Payer: Self-pay

## 2015-06-04 DIAGNOSIS — M159 Polyosteoarthritis, unspecified: Secondary | ICD-10-CM

## 2015-06-04 MED ORDER — MELOXICAM 7.5 MG PO TABS: 15.0000 mg | ORAL_TABLET | Freq: Every day | ORAL | Status: DC

## 2015-06-22 DIAGNOSIS — H40053 Ocular hypertension, bilateral: Secondary | ICD-10-CM | POA: Diagnosis not present

## 2015-09-02 ENCOUNTER — Other Ambulatory Visit: Payer: Self-pay | Admitting: Family Medicine

## 2015-09-02 NOTE — Telephone Encounter (Signed)
Last seen 04/13/15  Dr Sabra Heck

## 2015-10-14 ENCOUNTER — Encounter: Payer: Self-pay | Admitting: Family Medicine

## 2015-10-14 ENCOUNTER — Ambulatory Visit (INDEPENDENT_AMBULATORY_CARE_PROVIDER_SITE_OTHER): Payer: Medicare Other | Admitting: Family Medicine

## 2015-10-14 VITALS — BP 114/76 | HR 81 | Temp 98.9°F | Ht 70.0 in | Wt 306.0 lb

## 2015-10-14 DIAGNOSIS — R7309 Other abnormal glucose: Secondary | ICD-10-CM

## 2015-10-14 DIAGNOSIS — R739 Hyperglycemia, unspecified: Secondary | ICD-10-CM

## 2015-10-14 DIAGNOSIS — E785 Hyperlipidemia, unspecified: Secondary | ICD-10-CM | POA: Diagnosis not present

## 2015-10-14 DIAGNOSIS — I10 Essential (primary) hypertension: Secondary | ICD-10-CM | POA: Diagnosis not present

## 2015-10-14 DIAGNOSIS — J0100 Acute maxillary sinusitis, unspecified: Secondary | ICD-10-CM

## 2015-10-14 MED ORDER — AMOXICILLIN-POT CLAVULANATE 875-125 MG PO TABS: 1.0000 | ORAL_TABLET | Freq: Two times a day (BID) | ORAL | Status: DC

## 2015-10-14 NOTE — Progress Notes (Signed)
   Subjective:    Patient ID: Adam Barrera, male    DOB: 08-19-1951, 65 y.o.   MRN: [id number]  HPI  65 year old gentleman who is followed for lipids and blood pressure. He has no problems or issues with his medications. He had been itching and I suggested to stop niacin. That took care of the symptoms. His HDL cholesterol was a little bit low but LDL was at goal when last checked 6 months ago.  Since Christmas or for the past 3 weeks he has had a productive cough with sinus drainage. He has tried numerous over-the-counter prescriptions without any relief. Cough is productive of green sputum.  Patient Active Problem List   Diagnosis Date Noted  . Multiple pulmonary nodules 05/02/2013  . Osteoarthritis of left knee 01/16/2013  . Hyperglycemia 01/16/2013  . Dyspnea 03/27/2012  . Smoker 02/08/2012  . Asbestos exposure s evidence asbestosis 02/06/2012  . Hyperlipidemia 05/04/2009  . OBESITY, UNSPECIFIED 05/04/2009  . Essential hypertension 05/04/2009   Outpatient Encounter Prescriptions as of 10/14/2015  Medication Sig  . Flaxseed MISC by Does not apply route.  . Ginger, Zingiber officinalis, (GINGER PO) Take by mouth.  . losartan (COZAAR) 100 MG tablet Take 1 tablet (100 mg total) by mouth at bedtime.  . meloxicam (MOBIC) 7.5 MG tablet TAKE 2 TABLETS (15 MG TOTAL) BY MOUTH DAILY.  Marland Kitchen oxybutynin (DITROPAN) 5 MG tablet Take 1 tablet (5 mg total) by mouth 2 (two) times daily.  . pravastatin (PRAVACHOL) 40 MG tablet Take 1 tablet (40 mg total) by mouth at bedtime.  . TURMERIC PO Take by mouth.  Marland Kitchen UNABLE TO FIND Med Name: cinnamon, magnesium, and fish oil -- all OTC  . niacin 500 MG tablet Take 1 tablet (500 mg total) by mouth at bedtime. (Patient not taking: Reported on 10/14/2015)   No facility-administered encounter medications on file as of 10/14/2015.      Review of Systems  Constitutional: Negative.   HENT: Positive for congestion.   Respiratory: Positive for cough.   Genitourinary:  Positive for frequency.  Neurological: Negative.   Psychiatric/Behavioral: Negative.        Objective:   Physical Exam  Constitutional: He is oriented to person, place, and time. He appears well-developed and well-nourished.  HENT:  Head: Normocephalic.  Nose: Nose normal.  Mouth/Throat: Oropharynx is clear and moist.  Cardiovascular: Normal rate and regular rhythm.   Pulmonary/Chest: Effort normal and breath sounds normal.  Neurological: He is oriented to person, place, and time.          Assessment & Plan:  1. Essential hypertension Pressure is well controlled controlled on losartan. Continue same - CMP14+EGFR - Lipid panel  2. Hyperlipidemia Although patient has gained some weight since last check, would expect lipids to still be controlled on pravastatin - CMP14+EGFR - Lipid panel  3. Acute maxillary sinusitis, recurrence not specified 10 use Mucinex along with plenty of fluids. Rx Augmentin 875 one twice a day for 10 days  Wardell Honour MD

## 2015-10-15 LAB — CMP14+EGFR
ALT: 21 IU/L (ref 0–44)
AST: 18 IU/L (ref 0–40)
Albumin/Globulin Ratio: 1.5 (ref 1.1–2.5)
Albumin: 4 g/dL (ref 3.6–4.8)
Alkaline Phosphatase: 80 IU/L (ref 39–117)
BUN/Creatinine Ratio: 9 — ABNORMAL LOW (ref 10–22)
BUN: 10 mg/dL (ref 8–27)
Bilirubin Total: 0.5 mg/dL (ref 0.0–1.2)
CALCIUM: 9.7 mg/dL (ref 8.6–10.2)
CO2: 24 mmol/L (ref 18–29)
CREATININE: 1.14 mg/dL (ref 0.76–1.27)
Chloride: 100 mmol/L (ref 96–106)
GFR, EST AFRICAN AMERICAN: 79 mL/min/{1.73_m2} (ref >59)
GFR, EST NON AFRICAN AMERICAN: 69 mL/min/{1.73_m2} (ref >59)
GLOBULIN, TOTAL: 2.7 g/dL (ref 1.5–4.5)
Glucose: 167 mg/dL — ABNORMAL HIGH (ref 65–99)
Potassium: 4.6 mmol/L (ref 3.5–5.2)
Sodium: 141 mmol/L (ref 134–144)
TOTAL PROTEIN: 6.7 g/dL (ref 6.0–8.5)

## 2015-10-15 LAB — LIPID PANEL
CHOLESTEROL TOTAL: 169 mg/dL (ref 100–199)
Chol/HDL Ratio: 4.3 ratio units (ref 0.0–5.0)
HDL: 39 mg/dL — AB (ref >39)
LDL Calculated: 108 mg/dL — ABNORMAL HIGH (ref 0–99)
TRIGLYCERIDES: 109 mg/dL (ref 0–149)
VLDL Cholesterol Cal: 22 mg/dL (ref 5–40)

## 2015-10-15 NOTE — Addendum Note (Signed)
Addended by: Thana Ates on: 10/15/2015 03:40 PM   Modules accepted: Orders

## 2015-10-18 ENCOUNTER — Other Ambulatory Visit (INDEPENDENT_AMBULATORY_CARE_PROVIDER_SITE_OTHER): Payer: Medicare Other

## 2015-10-18 DIAGNOSIS — R7309 Other abnormal glucose: Secondary | ICD-10-CM

## 2015-10-18 DIAGNOSIS — R739 Hyperglycemia, unspecified: Secondary | ICD-10-CM

## 2015-10-18 LAB — POCT GLYCOSYLATED HEMOGLOBIN (HGB A1C): HEMOGLOBIN A1C: 7.1

## 2015-10-18 LAB — GLUCOSE, POCT (MANUAL RESULT ENTRY): POC Glucose: 172 mg/dl — AB (ref 70–99)

## 2015-10-20 DIAGNOSIS — M19012 Primary osteoarthritis, left shoulder: Secondary | ICD-10-CM | POA: Diagnosis not present

## 2015-12-09 ENCOUNTER — Other Ambulatory Visit: Payer: Self-pay | Admitting: Family Medicine

## 2016-02-02 DIAGNOSIS — M19012 Primary osteoarthritis, left shoulder: Secondary | ICD-10-CM | POA: Diagnosis not present

## 2016-03-11 ENCOUNTER — Other Ambulatory Visit: Payer: Self-pay | Admitting: Family

## 2016-03-13 NOTE — Telephone Encounter (Signed)
Last seen 10/14/15  Dr Sabra Heck

## 2016-04-13 ENCOUNTER — Ambulatory Visit (INDEPENDENT_AMBULATORY_CARE_PROVIDER_SITE_OTHER): Payer: Medicare Other | Admitting: Family

## 2016-04-13 ENCOUNTER — Encounter: Payer: Self-pay | Admitting: Family

## 2016-04-13 VITALS — BP 116/76 | HR 67 | Temp 98.1°F | Ht 70.0 in | Wt 300.6 lb

## 2016-04-13 DIAGNOSIS — E8881 Metabolic syndrome: Secondary | ICD-10-CM | POA: Diagnosis not present

## 2016-04-13 DIAGNOSIS — R739 Hyperglycemia, unspecified: Secondary | ICD-10-CM | POA: Diagnosis not present

## 2016-04-13 DIAGNOSIS — N3281 Overactive bladder: Secondary | ICD-10-CM | POA: Insufficient documentation

## 2016-04-13 DIAGNOSIS — Z720 Tobacco use: Secondary | ICD-10-CM | POA: Diagnosis not present

## 2016-04-13 DIAGNOSIS — E669 Obesity, unspecified: Secondary | ICD-10-CM | POA: Diagnosis not present

## 2016-04-13 DIAGNOSIS — R351 Nocturia: Secondary | ICD-10-CM | POA: Diagnosis not present

## 2016-04-13 DIAGNOSIS — E785 Hyperlipidemia, unspecified: Secondary | ICD-10-CM

## 2016-04-13 DIAGNOSIS — Z125 Encounter for screening for malignant neoplasm of prostate: Secondary | ICD-10-CM

## 2016-04-13 DIAGNOSIS — F17200 Nicotine dependence, unspecified, uncomplicated: Secondary | ICD-10-CM

## 2016-04-13 DIAGNOSIS — Z114 Encounter for screening for human immunodeficiency virus [HIV]: Secondary | ICD-10-CM

## 2016-04-13 DIAGNOSIS — Z1159 Encounter for screening for other viral diseases: Secondary | ICD-10-CM

## 2016-04-13 DIAGNOSIS — Z23 Encounter for immunization: Secondary | ICD-10-CM

## 2016-04-13 DIAGNOSIS — I10 Essential (primary) hypertension: Secondary | ICD-10-CM

## 2016-04-13 DIAGNOSIS — M1712 Unilateral primary osteoarthritis, left knee: Secondary | ICD-10-CM | POA: Diagnosis not present

## 2016-04-13 LAB — BAYER DCA HB A1C WAIVED: HB A1C: 7 % — AB (ref <7.0)

## 2016-04-13 NOTE — Progress Notes (Signed)
Subjective:    Patient ID: Adam Barrera, male    DOB: Aug 10, 1951, 65 y.o.   MRN: [id number]  PT presents to the office today for chronic follow up.  Hypertension This is a chronic problem. The current episode started more than 1 year ago. The problem has been resolved since onset. The problem is controlled. Pertinent negatives include no anxiety, headaches, palpitations, peripheral edema or shortness of breath. Risk factors for coronary artery disease include dyslipidemia, obesity, male gender and smoking/tobacco exposure. Past treatments include angiotensin blockers. The current treatment provides moderate improvement. There is no history of kidney disease, CAD/MI, CVA, heart failure or a thyroid problem. There is no history of sleep apnea.  Hyperlipidemia This is a chronic problem. The current episode started more than 1 year ago. The problem is uncontrolled. Recent lipid tests were reviewed and are high. Exacerbating diseases include obesity. Factors aggravating his hyperlipidemia include smoking. Pertinent negatives include no shortness of breath. Current antihyperlipidemic treatment includes statins. The current treatment provides mild improvement of lipids. Risk factors for coronary artery disease include dyslipidemia, male sex, obesity, hypertension and a sedentary lifestyle.  Arthritis Presents for follow-up visit. The disease course has been stable. He complains of pain. Affected locations include the left knee, left shoulder and right shoulder (back and hip). His pain is at a severity of 6/10. His pertinent risk factors include overuse. Past treatments include rest and NSAIDs. The treatment provided moderate relief.  OAB PT currently taking oxybutynin 5 mg BID. PT states this seems to help. Metabolic Syndrome Pt is overweight and is not on a low carb diet. PT currently taking pravastatin.     Review of Systems  Constitutional: Negative.   HENT: Negative.   Respiratory: Negative.   Negative for shortness of breath.   Cardiovascular: Negative.  Negative for palpitations.  Gastrointestinal: Negative.   Endocrine: Negative.   Genitourinary: Negative.   Musculoskeletal: Positive for arthritis.  Neurological: Negative.  Negative for headaches.  Hematological: Negative.   Psychiatric/Behavioral: Negative.   All other systems reviewed and are negative.      Objective:   Physical Exam  Constitutional: He is oriented to person, place, and time. He appears well-developed and well-nourished. No distress.  HENT:  Head: Normocephalic.  Right Ear: External ear normal.  Left Ear: External ear normal.  Nose: Nose normal.  Mouth/Throat: Oropharynx is clear and moist.  Eyes: Pupils are equal, round, and reactive to light. Right eye exhibits no discharge. Left eye exhibits no discharge.  Neck: Normal range of motion. Neck supple. No thyromegaly present.  Cardiovascular: Normal rate, regular rhythm, normal heart sounds and intact distal pulses.   No murmur heard. Pulmonary/Chest: Effort normal and breath sounds normal. No respiratory distress. He has no wheezes.  Abdominal: Soft. Bowel sounds are normal. He exhibits no distension. There is no tenderness.  Musculoskeletal: Normal range of motion. He exhibits no edema or tenderness.  Neurological: He is alert and oriented to person, place, and time. He has normal reflexes. No cranial nerve deficit.  Skin: Skin is warm and dry. No rash noted. No erythema.  Psychiatric: He has a normal mood and affect. His behavior is normal. Judgment and thought content normal.  Vitals reviewed.   BP 116/76 mmHg  Pulse 67  Temp(Src) 98.1 F (36.7 C) (Oral)  Ht '5\' 10"'  (1.778 m)  Wt 300 lb 9.6 oz (136.351 kg)  BMI 43.13 kg/m2       Assessment & Plan:  1. Hyperlipidemia - CMP14+EGFR -  Lipid panel  2. Essential hypertension - CMP14+EGFR  3. OAB (overactive bladder) - CMP14+EGFR - PSA, total and free  4. Primary osteoarthritis of  left knee - CMP14+EGFR  5. Obesity, unspecified - CMP14+EGFR  6. Smoker - MSX11+BZMC  7. Metabolic syndrome - Bayer DCA Hb A1c Waived - CMP14+EGFR  8. Screening for HIV (human immunodeficiency virus) - CMP14+EGFR - HIV antibody  9. Need for hepatitis C screening test - CMP14+EGFR - Hepatitis C Antibody  10. Prostate cancer screening - CMP14+EGFR - PSA, total and free  11. Nocturia - CMP14+EGFR - PSA, total and free   Continue all meds Labs pending Health Maintenance reviewed Diet and exercise encouraged RTO 6 months  Evelina Dun, FNP

## 2016-04-13 NOTE — Patient Instructions (Signed)

## 2016-04-13 NOTE — Addendum Note (Signed)
Addended by: Shelbie Ammons on: 04/13/2016 09:07 AM   Modules accepted: Orders

## 2016-04-14 ENCOUNTER — Other Ambulatory Visit: Payer: Self-pay | Admitting: Family

## 2016-04-14 DIAGNOSIS — E1165 Type 2 diabetes mellitus with hyperglycemia: Secondary | ICD-10-CM

## 2016-04-14 DIAGNOSIS — E119 Type 2 diabetes mellitus without complications: Secondary | ICD-10-CM | POA: Insufficient documentation

## 2016-04-14 LAB — CMP14+EGFR
ALT: 19 IU/L (ref 0–44)
AST: 19 IU/L (ref 0–40)
Albumin/Globulin Ratio: 1.6 (ref 1.2–2.2)
Albumin: 3.9 g/dL (ref 3.6–4.8)
Alkaline Phosphatase: 74 IU/L (ref 39–117)
BUN/Creatinine Ratio: 10 (ref 10–24)
BUN: 12 mg/dL (ref 8–27)
Bilirubin Total: 0.7 mg/dL (ref 0.0–1.2)
CALCIUM: 9.3 mg/dL (ref 8.6–10.2)
CHLORIDE: 100 mmol/L (ref 96–106)
CO2: 25 mmol/L (ref 18–29)
Creatinine, Ser: 1.17 mg/dL (ref 0.76–1.27)
GFR, EST AFRICAN AMERICAN: 77 mL/min/{1.73_m2} (ref >59)
GFR, EST NON AFRICAN AMERICAN: 66 mL/min/{1.73_m2} (ref >59)
GLUCOSE: 146 mg/dL — AB (ref 65–99)
Globulin, Total: 2.5 g/dL (ref 1.5–4.5)
POTASSIUM: 4.7 mmol/L (ref 3.5–5.2)
Sodium: 140 mmol/L (ref 134–144)
TOTAL PROTEIN: 6.4 g/dL (ref 6.0–8.5)

## 2016-04-14 LAB — LIPID PANEL
Chol/HDL Ratio: 4.1 ratio units (ref 0.0–5.0)
Cholesterol, Total: 149 mg/dL (ref 100–199)
HDL: 36 mg/dL — ABNORMAL LOW (ref >39)
LDL Calculated: 89 mg/dL (ref 0–99)
Triglycerides: 121 mg/dL (ref 0–149)
VLDL Cholesterol Cal: 24 mg/dL (ref 5–40)

## 2016-04-14 LAB — PSA, TOTAL AND FREE
PSA FREE PCT: 36.7 %
PSA FREE: 0.22 ng/mL
Prostate Specific Ag, Serum: 0.6 ng/mL (ref 0.0–4.0)

## 2016-04-14 LAB — HIV ANTIBODY (ROUTINE TESTING W REFLEX): HIV Screen 4th Generation wRfx: NONREACTIVE

## 2016-04-14 LAB — HEPATITIS C ANTIBODY

## 2016-04-14 MED ORDER — METFORMIN HCL 500 MG PO TABS: 500.0000 mg | ORAL_TABLET | Freq: Two times a day (BID) | ORAL | Status: DC

## 2016-05-02 ENCOUNTER — Ambulatory Visit (INDEPENDENT_AMBULATORY_CARE_PROVIDER_SITE_OTHER): Payer: Medicare Other | Admitting: Pharmacist

## 2016-05-02 ENCOUNTER — Encounter: Payer: Self-pay | Admitting: Pharmacist

## 2016-05-02 VITALS — BP 120/78 | HR 75 | Ht 70.0 in | Wt 300.0 lb

## 2016-05-02 DIAGNOSIS — E1165 Type 2 diabetes mellitus with hyperglycemia: Secondary | ICD-10-CM

## 2016-05-02 MED ORDER — ONETOUCH DELICA LANCETS 33G MISC
5 refills | Status: DC
Start: 2016-05-02 — End: 2017-10-25

## 2016-05-02 MED ORDER — GLUCOSE BLOOD VI STRP: ORAL_STRIP | 4 refills | Status: DC

## 2016-05-02 NOTE — Progress Notes (Signed)
Patient ID: Adam Barrera, male   DOB: 10/23/1950, 65 y.o.   MRN: [id number]  Subjective:    Adam Barrera is a 65 y.o. male who presents for an initial evaluation of Type 2 diabetes mellitus.   Patient was diagnosed with T2DM around 04/13/2016.  A1c was 7.0% and FBG was 146.  He has started metformin 500mg  1 tablet bid and is tolerating well.   Does not check BG at home because he doesn't have a glucometer  Known diabetic complications: none Cardiovascular risk factors: advanced age (older than 60 for men, 64 for women), diabetes mellitus, dyslipidemia, hypertension, male gender and obesity (BMI >= 30 kg/m2) Eye exam current (within one year): no Weight trend: stable Prior visit with dietician: no Current diet: in general, an "unhealthy" diet Current exercise: none  Current monitoring regimen: none Home blood sugar records: none Any episodes of hypoglycemia? no  Is He on ACE inhibitor or angiotensin II receptor blocker?  Yes    losartan (Cozaar)   Objective:    BP 120/78   Pulse 75   Ht 5\' 10"  (1.778 m)   Wt 300 lb (136.1 kg)   BMI 43.05 kg/m   Lab Review Glucose (mg/dL)  Date Value  04/13/2016 146 (H)  10/14/2015 167 (H)  04/13/2015 90   Glucose, Bld (mg/dL)  Date Value  04/17/2013 97  01/16/2013 137 (H)  01/15/2013 130 (H)   CO2 (mmol/L)  Date Value  04/13/2016 25  10/14/2015 24  04/13/2015 25   BUN (mg/dL)  Date Value  04/13/2016 12  10/14/2015 10  04/13/2015 8   Creat (mg/dL)  Date Value  04/17/2013 1.13   Creatinine, Ser (mg/dL)  Date Value  04/13/2016 1.17  10/14/2015 1.14  04/13/2015 1.09   Assessment:    Diabetes Mellitus type II, under fair control.    Plan:    1.  Rx changes: none 2.  Education: Reviewed 'ABCs' of diabetes management (respective goals in parentheses):  A1C (<7), blood pressure (<130/80), and cholesterol (LDL <100). 3. Gave One touch verio flex glucometer and rx for test strips and lancet sent to pharmacy. Taught  how to check BG with glucometer. Discussed HBG goals. 4. CHO counting diet discussed.  Reviewed CHO amount in various foods and how to read nutrition labels.  Discussed recommended serving sizes.  5.  Recommended increase physical activity - goal is 150 minutes per week but start with 10 minutes daily and increase as able 6.   Orders Placed This Encounter  Procedures  . Microalbumin / creatinine urine ratio    7. Follow up: 2 months

## 2016-05-02 NOTE — Patient Instructions (Signed)
Diabetes and Standards of Medical Care   Diabetes is complicated. You may find that your diabetes team includes a dietitian, nurse, diabetes educator, eye doctor, and more. To help everyone know what is going on and to help you get the care you deserve, the following schedule of care was developed to help keep you on track. Below are the tests, exams, vaccines, medicines, education, and plans you will need.  Blood Glucose Goals Prior to meals = 80 - 130 Within 2 hours of the start of a meal = less than 180  HbA1c test (goal is less than 6.5% - your last value was 7.0%) This test shows how well you have controlled your glucose over the past 2 to 3 months. It is used to see if your diabetes management plan needs to be adjusted.   It is performed at least 2 times a year if you are meeting treatment goals.  It is performed 4 times a year if therapy has changed or if you are not meeting treatment goals.  Blood pressure test  This test is performed at every routine medical visit. The goal is less than 140/90 mmHg for most people, but 130/80 mmHg in some cases. Ask your health care provider about your goal.  Dental exam  Follow up with the dentist regularly.  Eye exam  If you are diagnosed with type 1 diabetes as a child, get an exam upon reaching the age of 67 years or older and have had diabetes for 3 to 5 years. Yearly eye exams are recommended after that initial eye exam.  If you are diagnosed with type 1 diabetes as an adult, get an exam within 5 years of diagnosis and then yearly.  If you are diagnosed with type 2 diabetes, get an exam as soon as possible after the diagnosis and then yearly.  Foot care exam  Visual foot exams are performed at every routine medical visit. The exams check for cuts, injuries, or other problems with the feet.  A comprehensive foot exam should be done yearly. This includes visual inspection as well as assessing foot pulses and testing for loss of  sensation.  Check your feet nightly for cuts, injuries, or other problems with your feet. Tell your health care provider if anything is not healing.  Kidney function test (urine microalbumin)  This test is performed once a year.  Type 1 diabetes: The first test is performed 5 years after diagnosis.  Type 2 diabetes: The first test is performed at the time of diagnosis.  A serum creatinine and estimated glomerular filtration rate (eGFR) test is done once a year to assess the level of chronic kidney disease (CKD), if present.  Lipid profile (cholesterol, HDL, LDL, triglycerides)  Performed every 5 years for most people.  The goal for LDL is less than 100 mg/dL. If you are at high risk, the goal is less than 70 mg/dL.  The goal for HDL is 40 mg/dL to 50 mg/dL for men and 50 mg/dL to 60 mg/dL for women. An HDL cholesterol of 60 mg/dL or higher gives some protection against heart disease.  The goal for triglycerides is less than 150 mg/dL.  Influenza vaccine, pneumococcal vaccine, and hepatitis B vaccine  The influenza vaccine is recommended yearly.  The pneumococcal vaccine is generally given once in a lifetime. However, there are some instances when another vaccination is recommended. Check with your health care provider.  The hepatitis B vaccine is also recommended for adults with diabetes.  Diabetes self-management education  Education is recommended at diagnosis and ongoing as needed.  Treatment plan  Your treatment plan is reviewed at every medical visit.  Document Released: 07/09/2009 Document Revised: 05/14/2013 Document Reviewed: 02/11/2013 [employer] Patient Information 2014 [employer].   

## 2016-05-03 ENCOUNTER — Other Ambulatory Visit: Payer: Self-pay | Admitting: Family Medicine

## 2016-05-03 DIAGNOSIS — N400 Enlarged prostate without lower urinary tract symptoms: Secondary | ICD-10-CM

## 2016-05-03 LAB — MICROALBUMIN / CREATININE URINE RATIO
Creatinine, Urine: 160.4 mg/dL
MICROALB/CREAT RATIO: 4.9 mg/g creat (ref 0.0–30.0)
Microalbumin, Urine: 7.9 ug/mL

## 2016-05-16 ENCOUNTER — Other Ambulatory Visit: Payer: Self-pay | Admitting: Family Medicine

## 2016-05-16 DIAGNOSIS — E785 Hyperlipidemia, unspecified: Secondary | ICD-10-CM

## 2016-06-09 ENCOUNTER — Encounter (INDEPENDENT_AMBULATORY_CARE_PROVIDER_SITE_OTHER): Payer: Self-pay

## 2016-06-12 ENCOUNTER — Other Ambulatory Visit: Payer: Self-pay | Admitting: Family

## 2016-06-13 DIAGNOSIS — M19012 Primary osteoarthritis, left shoulder: Secondary | ICD-10-CM | POA: Diagnosis not present

## 2016-06-19 ENCOUNTER — Other Ambulatory Visit (INDEPENDENT_AMBULATORY_CARE_PROVIDER_SITE_OTHER): Payer: Self-pay | Admitting: Orthopaedic Surgery

## 2016-06-19 DIAGNOSIS — M19012 Primary osteoarthritis, left shoulder: Secondary | ICD-10-CM

## 2016-06-21 ENCOUNTER — Telehealth: Payer: Self-pay | Admitting: *Deleted

## 2016-06-21 NOTE — Telephone Encounter (Signed)
Please call our office to schedule an appointment for surgical clearance with either Dr. Sabra Heck or Ms. Hawks.   We have the form from [facility] but can not complete until EKG, chest x-ray and check up are done.

## 2016-06-27 ENCOUNTER — Ambulatory Visit
Admission: RE | Admit: 2016-06-27 | Discharge: 2016-06-27 | Disposition: A | Discharge status: Home / Self Care | Payer: Medicare Other | Source: Ambulatory Visit | Attending: Orthopaedic Surgery | Admitting: Orthopaedic Surgery

## 2016-06-27 DIAGNOSIS — M19012 Primary osteoarthritis, left shoulder: Secondary | ICD-10-CM | POA: Diagnosis not present

## 2016-06-28 ENCOUNTER — Ambulatory Visit (INDEPENDENT_AMBULATORY_CARE_PROVIDER_SITE_OTHER): Payer: Medicare Other | Admitting: Orthopaedic Surgery

## 2016-06-28 DIAGNOSIS — M19012 Primary osteoarthritis, left shoulder: Secondary | ICD-10-CM

## 2016-07-03 NOTE — Progress Notes (Signed)
   Subjective:    Patient ID: Adam Barrera, male    DOB: 1951-03-23, 65 y.o.   MRN: [id number]  HPI patient with left shoulder pain which is increasing. He has been followed by orthopedics in [facility] first Dr. Durward Fortes who has referred him to another member in his practice Dr. Marlou Sa for surgery. The plan is for shoulder replacement. Patient has difficulty doing anything above shoulder level. This practice request EKG and chest x-ray prior to surgery.  Patient Active Problem List   Diagnosis Date Noted  . Diabetes mellitus, type 2 ([place]) 04/14/2016  . OAB (overactive bladder) 04/13/2016  . Metabolic syndrome [id number]  . Multiple pulmonary nodules 05/02/2013  . Osteoarthritis of left knee 01/16/2013  . Hyperglycemia 01/16/2013  . Dyspnea 03/27/2012  . Smoker 02/08/2012  . Asbestos exposure s evidence asbestosis 02/06/2012  . Hyperlipidemia 05/04/2009  . OBESITY, UNSPECIFIED 05/04/2009  . Essential hypertension 05/04/2009   Outpatient Encounter Prescriptions as of 07/04/2016  Medication Sig  . Chromium-Cinnamon (CINNAMON PLUS CHROMIUM) [phone] MCG-MG CAPS Take 2 tablets by mouth daily.  . Flaxseed MISC by Does not apply route.  Marland Kitchen GARCINIA CAMBOGIA-CHROMIUM PO Take 1 tablet by mouth 2 (two) times daily.  . Ginger, Zingiber officinalis, (GINGER PO) Take by mouth.  Marland Kitchen glucose blood test strip One Touch verio test strips. Use to check BG once a day. Dx: type 2 DM E11.9  . losartan (COZAAR) 100 MG tablet Take 1 tablet (100 mg total) by mouth at bedtime.  . magnesium oxide (MAG-OX) 400 MG tablet Take 400 mg by mouth daily.  . meloxicam (MOBIC) 7.5 MG tablet TAKE 2 TABLETS (15 MG TOTAL) BY MOUTH DAILY.  . metFORMIN (GLUCOPHAGE) 500 MG tablet Take 1 tablet (500 mg total) by mouth 2 (two) times daily with a meal.  . Omega-3 Fatty Acids (FISH OIL) 1200 MG CPDR Take 2 capsules by mouth daily.  Glory Rosebush DELICA LANCETS 99991111 MISC Use to check BG once a day.  Dx: type 2 DM E11.9  . oxybutynin  (DITROPAN) 5 MG tablet TAKE 1 TABLET (5 MG TOTAL) BY MOUTH 2 (TWO) TIMES DAILY.  . pravastatin (PRAVACHOL) 40 MG tablet TAKE 1 TABLET (40 MG TOTAL) BY MOUTH AT BEDTIME.  Marland Kitchen TURMERIC PO Take by mouth.   No facility-administered encounter medications on file as of 07/04/2016.       Review of Systems  Respiratory: Negative.   Musculoskeletal: Positive for arthralgias.       Objective:   Physical Exam  Constitutional: He is oriented to person, place, and time. He appears well-developed and well-nourished.  Cardiovascular: Normal rate, regular rhythm, normal heart sounds and intact distal pulses.   Pulmonary/Chest: Effort normal and breath sounds normal.  Neurological: He is alert and oriented to person, place, and time.  Psychiatric: He has a normal mood and affect. His behavior is normal.   BP 119/81   Pulse 72   Temp 98.3 F (36.8 C) (Oral)   Ht 5\' 10"  (1.778 m)   Wt 291 lb (132 kg)   BMI 41.75 kg/m         Assessment & Plan:  1. Pre-operative cardiovascular examination EKG is within normal limits; Chest x-ray is also within normal limits - EKG 12-Lead - DG Chest 2 View; Future  Wardell Honour MD

## 2016-07-04 ENCOUNTER — Ambulatory Visit (INDEPENDENT_AMBULATORY_CARE_PROVIDER_SITE_OTHER): Payer: Medicare Other

## 2016-07-04 ENCOUNTER — Encounter: Payer: Self-pay | Admitting: Family Medicine

## 2016-07-04 ENCOUNTER — Ambulatory Visit (INDEPENDENT_AMBULATORY_CARE_PROVIDER_SITE_OTHER): Payer: Medicare Other | Admitting: Family Medicine

## 2016-07-04 VITALS — BP 119/81 | HR 72 | Temp 98.3°F | Ht 70.0 in | Wt 291.0 lb

## 2016-07-04 DIAGNOSIS — Z01810 Encounter for preprocedural cardiovascular examination: Secondary | ICD-10-CM | POA: Diagnosis not present

## 2016-07-05 ENCOUNTER — Other Ambulatory Visit: Payer: Self-pay | Admitting: Family Medicine

## 2016-07-05 DIAGNOSIS — I10 Essential (primary) hypertension: Secondary | ICD-10-CM

## 2016-07-10 ENCOUNTER — Ambulatory Visit (INDEPENDENT_AMBULATORY_CARE_PROVIDER_SITE_OTHER): Payer: Medicare Other | Admitting: Orthopedic Surgery

## 2016-07-10 DIAGNOSIS — M19012 Primary osteoarthritis, left shoulder: Secondary | ICD-10-CM | POA: Diagnosis not present

## 2016-07-13 ENCOUNTER — Other Ambulatory Visit: Payer: Self-pay | Admitting: Orthopedic Surgery

## 2016-07-18 ENCOUNTER — Encounter: Payer: Self-pay | Admitting: Pharmacist

## 2016-07-18 ENCOUNTER — Ambulatory Visit (INDEPENDENT_AMBULATORY_CARE_PROVIDER_SITE_OTHER): Payer: Medicare Other | Admitting: Pharmacist

## 2016-07-18 VITALS — BP 110/70 | HR 72 | Ht 70.0 in | Wt 293.0 lb

## 2016-07-18 DIAGNOSIS — E1165 Type 2 diabetes mellitus with hyperglycemia: Secondary | ICD-10-CM

## 2016-07-18 DIAGNOSIS — Z0000 Encounter for general adult medical examination without abnormal findings: Secondary | ICD-10-CM

## 2016-07-18 DIAGNOSIS — Z23 Encounter for immunization: Secondary | ICD-10-CM | POA: Diagnosis not present

## 2016-07-18 DIAGNOSIS — F17200 Nicotine dependence, unspecified, uncomplicated: Secondary | ICD-10-CM | POA: Diagnosis not present

## 2016-07-18 LAB — GLUCOSE HEMOCUE WAIVED: Glu Hemocue Waived: 131 mg/dL — ABNORMAL HIGH (ref 65–99)

## 2016-07-18 LAB — BAYER DCA HB A1C WAIVED: HB A1C (BAYER DCA - WAIVED): 5.7 % (ref <7.0)

## 2016-07-18 MED ORDER — VARENICLINE TARTRATE 0.5 MG X 11 & 1 MG X 42 PO MISC: ORAL | 0 refills | Status: DC

## 2016-07-18 NOTE — Patient Instructions (Addendum)
Adam Barrera , Thank you for taking time to come for your Medicare Wellness Visit. I appreciate your ongoing commitment to your health goals. Please review the following plan we discussed and let me know if I can assist you in the future.   These are the goals we discussed:  Make appointment to have a diabetic eye exam  Continue to limit your portion sizes and foods with sugar.  Increase non-starchy vegetables - carrots, green bean, squash, zucchini, tomatoes, onions, peppers, spinach and other green leafy vegetables, cabbage, lettuce, cucumbers, asparagus, okra (not fried), eggplant Increase fresh fruit but limit serving sizes 1/2 cup or about the size of tennis or baseball Limit sugar and processed foods (cakes, cookies, ice cream, crackers and chips) Limit red meat to no more than 1-2 times per week (serving size about the size of your palm) Choose whole grains / lean proteins - whole wheat bread, quinoa, whole grain rice (1/2 cup), fish, chicken, [place] Avoid sugar and calorie containing beverages - soda, sweet tea and juice.  Choose water or unsweetened tea instead.  Weight goal - decrease by 10% or 30 lbs over the next 6 months - you have already lost 7lbs which is great! Keep up the good work.    Increase physical activity.  Goal is 150 minutes per week   This is a list of the screening recommended for you and due dates:  Health Maintenance  Topic Date Due  . Pneumococcal vaccine (1) Done today  . Complete foot exam   Done today  . Eye exam for diabetics  Due now  . Flu Shot  Done today  . Hemoglobin A1C  10/14/2016  . Tetanus Vaccine  04/12/2021  . Colon Cancer Screening  05/29/2021  . Shingles Vaccine  Completed  .  Hepatitis C: One time screening is recommended by Center for Disease Control  (CDC) for  adults born from [birth year] through 1963.   Completed  . HIV Screening  Completed  *Topic was postponed. The date shown is not the original due date.    Diabetes and Standards of  Medical Care   Diabetes is complicated. You may find that your diabetes team includes a dietitian, nurse, diabetes educator, eye doctor, and more. To help everyone know what is going on and to help you get the care you deserve, the following schedule of care was developed to help keep you on track. Below are the tests, exams, vaccines, medicines, education, and plans you will need.  Blood Glucose Goals Prior to meals = 80 - 130 Within 2 hours of the start of a meal = less than 180  HbA1c test (goal is less than 6.5% - your last value was 5.7%!! - great job!) This test shows how well you have controlled your glucose over the past 2 to 3 months. It is used to see if your diabetes management plan needs to be adjusted.   It is performed at least 2 times a year if you are meeting treatment goals.  It is performed 4 times a year if therapy has changed or if you are not meeting treatment goals.  Blood pressure test  This test is performed at every routine medical visit. The goal is less than 140/90 mmHg for most people, but 130/80 mmHg in some cases. Ask your health care provider about your goal.  Dental exam  Follow up with the dentist regularly.  Eye exam  If you are diagnosed with type 1 diabetes as a child, get  an exam upon reaching the age of 85 years or older and have had diabetes for 3 to 5 years. Yearly eye exams are recommended after that initial eye exam.  If you are diagnosed with type 1 diabetes as an adult, get an exam within 5 years of diagnosis and then yearly.  If you are diagnosed with type 2 diabetes, get an exam as soon as possible after the diagnosis and then yearly.  Foot care exam Diabetes may cause you to have problems because of poor blood supply (circulation) to your feet and legs. This may cause the skin on your feet to become thinner, break easier, and heal more slowly. Your skin may become dry, and the skin may peel and crack. You may also have nerve damage in  your legs and feet causing decreased feeling in them. You may not notice minor injuries to your feet that could lead to infections or more serious problems. Taking care of your feet is one of the most important things you can do for yourself. .  A comprehensive foot exam should be done yearly. This includes visual inspection as well as assessing foot pulses and testing for loss of sensation.  Check your feet nightly for cuts, injuries, or other problems with your feet. Tell your health care provider if anything is not healing. Wear shoes at all times, even in the house. Do not go barefoot. Bare feet are easily injured. Wash your feet with warm water (do not use hot water) and mild soap. Then pat your feet and the areas between your toes until they are completely dry. Do not soak your feet as this can dry your skin. Apply a moisturizing lotion or petroleum jelly (that does not contain alcohol and is unscented) to the skin on your feet and to dry, brittle toenails. Do not apply lotion between your toes. Trim your toenails straight across. Do not dig under them or around the cuticle. File the edges of your nails with an emery board or nail file. Do not cut corns or calluses or try to remove them with medicine. Wear clean socks or stockings every day. Make sure they are not too tight. Do not wear knee-high stockings since they may decrease blood flow to your legs. Wear shoes that fit properly and have enough cushioning. To break in new shoes, wear them for just a few hours a day. This prevents you from injuring your feet. Always look in your shoes before you put them on to be sure there are no objects inside. Do not cross your legs. This may decrease the blood flow to your feet. If you find a minor scrape, cut, or break in the skin on your feet, keep it and the skin around it clean and dry. These areas may be cleansed with mild soap and water. Do not cleanse the area with peroxide, alcohol, or iodine. When  you remove an adhesive bandage, be sure not to damage the skin around it. If you have a wound, look at it several times a day to make sure it is healing. Do not use heating pads or hot water bottles. They may burn your skin. If you have lost feeling in your feet or legs, you may not know it is happening until it is too late. Make sure your health care provider performs a complete foot exam at least annually or more often if you have foot problems. Report any cuts, sores, or bruises to your health care provider immediately.  Kidney  function test (urine microalbumin)  This test is performed once a year.  Type 1 diabetes: The first test is performed 5 years after diagnosis.  Type 2 diabetes: The first test is performed at the time of diagnosis.  A serum creatinine and estimated glomerular filtration rate (eGFR) test is done once a year to assess the level of chronic kidney disease (CKD), if present.  Lipid profile (cholesterol, HDL, LDL, triglycerides)  Performed every 5 years for most people.  The goal for LDL is less than 100 mg/dL. If you are at high risk, the goal is less than 70 mg/dL.  The goal for HDL is 40 mg/dL to 50 mg/dL for men and 50 mg/dL to 60 mg/dL for women. An HDL cholesterol of 60 mg/dL or higher gives some protection against heart disease.  The goal for triglycerides is less than 150 mg/dL.  Influenza vaccine, pneumococcal vaccine, and hepatitis B vaccine  The influenza vaccine is recommended yearly.  The pneumococcal vaccine is recommended for all diabetics.  It is recommended to receive one vaccine prior to your 65th birthday and then 2 vaccine at least 1 year apart after you turn 32.   The hepatitis B vaccine is also recommended for adults with diabetes.  Diabetes self-management education  Education is recommended at diagnosis and ongoing as needed.  Treatment plan  Your treatment plan is reviewed at every medical visit.  Document Released: 07/09/2009  Document Revised: 05/14/2013 Document Reviewed: 02/11/2013 [facility] Patient Information 2014 [place].
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
## 2016-07-27 DIAGNOSIS — H40053 Ocular hypertension, bilateral: Secondary | ICD-10-CM | POA: Diagnosis not present

## 2016-07-27 LAB — HM DIABETES EYE EXAM

## 2016-08-11 ENCOUNTER — Encounter (HOSPITAL_COMMUNITY): Payer: Self-pay

## 2016-08-11 ENCOUNTER — Other Ambulatory Visit (INDEPENDENT_AMBULATORY_CARE_PROVIDER_SITE_OTHER): Payer: Self-pay | Admitting: Orthopedic Surgery

## 2016-08-11 NOTE — Pre-Procedure Instructions (Signed)
Adam Barrera  08/11/2016      [id number] - [place], [place] [address] [address] Phone: [phone] Fax: [phone]    Your procedure is scheduled on Monday November 27.  Report to [facility] Admitting at 5:30 A.M.  Call this number if you have problems the morning of surgery:  [phone]   Remember:  Do not eat food or drink liquids after midnight.  Take these medicines the morning of surgery with A SIP OF WATER: oxybutynin (Ditropan)  7 days prior to surgery STOP taking any meloxicam (mobic), Aspirin, Aleve, Naproxen, Ibuprofen, Motrin, Advil, Goody's, BC's, all herbal medications, fish oil, and all vitamins (cinnamon, flaxseed oil, ginger root, omega-3- fatty acids, turmeric)     WHAT DO I DO ABOUT MY DIABETES MEDICATION?   Marland Kitchen Do not take oral diabetes medicines (pills) the morning of surgery. DO NOT TAKE Metformin (Glucophage) the day of surgery    How to Manage Your Diabetes Before and After Surgery  Why is it important to control my blood sugar before and after surgery? . Improving blood sugar levels before and after surgery helps healing and can limit problems. . A way of improving blood sugar control is eating a healthy diet by: o  Eating less sugar and carbohydrates o  Increasing activity/exercise o  Talking with your doctor about reaching your blood sugar goals . High blood sugars (greater than 180 mg/dL) can raise your risk of infections and slow your recovery, so you will need to focus on controlling your diabetes during the weeks before surgery. . Make sure that the doctor who takes care of your diabetes knows about your planned surgery including the date and location.  How do I manage my blood sugar before surgery? . Check your blood sugar at least 4 times a day, starting 2 days before surgery, to make sure that the level is not too high or low. o Check your blood sugar the morning of  your surgery when you wake up and every 2 hours until you get to the Short Stay unit. . If your blood sugar is less than 70 mg/dL, you will need to treat for low blood sugar: o Do not take insulin. o Treat a low blood sugar (less than 70 mg/dL) with  cup of clear juice (cranberry or apple), 4 glucose tablets, OR glucose gel. o Recheck blood sugar in 15 minutes after treatment (to make sure it is greater than 70 mg/dL). If your blood sugar is not greater than 70 mg/dL on recheck, call [phone] for further instructions. . Report your blood sugar to the short stay nurse when you get to Short Stay.  . If you are admitted to the hospital after surgery: o Your blood sugar will be checked by the staff and you will probably be given insulin after surgery (instead of oral diabetes medicines) to make sure you have good blood sugar levels. o The goal for blood sugar control after surgery is 80-180 mg/dL.             Do not wear jewelry, make-up or nail polish.  Do not wear lotions, powders, or perfumes, or deoderant.  Do not shave 48 hours prior to surgery.  Men may shave face and neck.  Do not bring valuables to the hospital.  [facility] is not responsible for any belongings or valuables.  Contacts, dentures or bridgework may not be worn into surgery.  Leave  your suitcase in the car.  After surgery it may be brought to your room.  For patients admitted to the hospital, discharge time will be determined by your treatment team.  Patients discharged the day of surgery will not be allowed to drive home.    Special instructions:    [facility]- Preparing For Surgery  Before surgery, you can play an important role. Because skin is not sterile, your skin needs to be as free of germs as possible. You can reduce the number of germs on your skin by washing with CHG (chlorahexidine gluconate) Soap before surgery.  CHG is an antiseptic cleaner which kills germs and bonds with the skin to  continue killing germs even after washing.  Please do not use if you have an allergy to CHG or antibacterial soaps. If your skin becomes reddened/irritated stop using the CHG.  Do not shave (including legs and underarms) for at least 48 hours prior to first CHG shower. It is OK to shave your face.  Please follow these instructions carefully.   1. Shower the NIGHT BEFORE SURGERY and the MORNING OF SURGERY with CHG.   2. If you chose to wash your hair, wash your hair first as usual with your normal shampoo.  3. After you shampoo, rinse your hair and body thoroughly to remove the shampoo.  4. Use CHG as you would any other liquid soap. You can apply CHG directly to the skin and wash gently with a scrungie or a clean washcloth.   5. Apply the CHG Soap to your body ONLY FROM THE NECK DOWN.  Do not use on open wounds or open sores. Avoid contact with your eyes, ears, mouth and genitals (private parts). Wash genitals (private parts) with your normal soap.  6. Wash thoroughly, paying special attention to the area where your surgery will be performed.  7. Thoroughly rinse your body with warm water from the neck down.  8. DO NOT shower/wash with your normal soap after using and rinsing off the CHG Soap.  9. Pat yourself dry with a CLEAN TOWEL.   10. Wear CLEAN PAJAMAS   11. Place CLEAN SHEETS on your bed the night of your first shower and DO NOT SLEEP WITH PETS.    Day of Surgery: Do not apply any deodorants/lotions. Please wear clean clothes to the hospital/surgery center.      Please read over the following fact sheets that you were given. MRSA Information

## 2016-08-14 ENCOUNTER — Encounter (HOSPITAL_COMMUNITY)
Admission: RE | Admit: 2016-08-14 | Discharge: 2016-08-14 | Disposition: A | Discharge status: Home / Self Care | Payer: Medicare Other | Source: Ambulatory Visit | Attending: Orthopedic Surgery | Admitting: Orthopedic Surgery

## 2016-08-14 ENCOUNTER — Encounter (HOSPITAL_COMMUNITY): Payer: Self-pay

## 2016-08-14 ENCOUNTER — Ambulatory Visit (HOSPITAL_COMMUNITY)
Admission: RE | Admit: 2016-08-14 | Discharge: 2016-08-14 | Disposition: A | Discharge status: Home / Self Care | Payer: Medicare Other | Source: Ambulatory Visit | Attending: Orthopedic Surgery | Admitting: Orthopedic Surgery

## 2016-08-14 DIAGNOSIS — F17200 Nicotine dependence, unspecified, uncomplicated: Secondary | ICD-10-CM | POA: Diagnosis not present

## 2016-08-14 DIAGNOSIS — Z0183 Encounter for blood typing: Secondary | ICD-10-CM | POA: Diagnosis not present

## 2016-08-14 DIAGNOSIS — Z01818 Encounter for other preprocedural examination: Secondary | ICD-10-CM

## 2016-08-14 DIAGNOSIS — I10 Essential (primary) hypertension: Secondary | ICD-10-CM | POA: Diagnosis not present

## 2016-08-14 DIAGNOSIS — M19012 Primary osteoarthritis, left shoulder: Secondary | ICD-10-CM | POA: Diagnosis not present

## 2016-08-14 DIAGNOSIS — Z01812 Encounter for preprocedural laboratory examination: Secondary | ICD-10-CM | POA: Insufficient documentation

## 2016-08-14 LAB — BASIC METABOLIC PANEL
Anion gap: 9 (ref 5–15)
BUN: 10 mg/dL (ref 6–20)
CALCIUM: 9.4 mg/dL (ref 8.9–10.3)
CO2: 27 mmol/L (ref 22–32)
CREATININE: 1.09 mg/dL (ref 0.61–1.24)
Chloride: 102 mmol/L (ref 101–111)
GFR calc Af Amer: >60 mL/min (ref >60)
GLUCOSE: 97 mg/dL (ref 65–99)
Potassium: 4.3 mmol/L (ref 3.5–5.1)
Sodium: 138 mmol/L (ref 135–145)

## 2016-08-14 LAB — URINALYSIS, ROUTINE W REFLEX MICROSCOPIC
BILIRUBIN URINE: NEGATIVE
Glucose, UA: NEGATIVE mg/dL
HGB URINE DIPSTICK: NEGATIVE
Ketones, ur: NEGATIVE mg/dL
Leukocytes, UA: NEGATIVE
Nitrite: NEGATIVE
PH: 7.5 (ref 5.0–8.0)
Protein, ur: NEGATIVE mg/dL
SPECIFIC GRAVITY, URINE: 1.022 (ref 1.005–1.030)

## 2016-08-14 LAB — CBC
HCT: 44.6 % (ref 39.0–52.0)
Hemoglobin: 14.5 g/dL (ref 13.0–17.0)
MCH: 30.1 pg (ref 26.0–34.0)
MCHC: 32.5 g/dL (ref 30.0–36.0)
MCV: 92.5 fL (ref 78.0–100.0)
Platelets: 174 10*3/uL (ref 150–400)
RBC: 4.82 MIL/uL (ref 4.22–5.81)
RDW: 13.6 % (ref 11.5–15.5)
WBC: 7.7 10*3/uL (ref 4.0–10.5)

## 2016-08-14 LAB — SURGICAL PCR SCREEN
MRSA, PCR: NEGATIVE
STAPHYLOCOCCUS AUREUS: NEGATIVE

## 2016-08-14 LAB — GLUCOSE, CAPILLARY: GLUCOSE-CAPILLARY: 101 mg/dL — AB (ref 65–99)

## 2016-08-14 LAB — TYPE AND SCREEN
ABO/RH(D): O POS
ANTIBODY SCREEN: NEGATIVE

## 2016-08-14 NOTE — Progress Notes (Signed)
   08/14/16 1406  OBSTRUCTIVE SLEEP APNEA  Have you ever been diagnosed with sleep apnea through a sleep study? No  Do you snore loudly (loud enough to be heard through closed doors)?  0  Do you often feel tired, fatigued, or sleepy during the daytime (such as falling asleep during driving or talking to someone)? 0  Has anyone observed you stop breathing during your sleep? 0  Do you have, or are you being treated for high blood pressure? 1  BMI more than 35 kg/m2? 1  Age > 50 (1-yes) 1  Neck circumference greater than:Male 16 inches or larger, Male 17inches or larger? 1  Male Gender (Yes=1) 1  Obstructive Sleep Apnea Score 5  Score 5 or greater  Results sent to PCP

## 2016-08-14 NOTE — Progress Notes (Signed)
PCP: Evelina Dun, pt also sees Dr. Sabra Heck at [facility] No cardiologist or cardiac workup per pt.   EKG: 07/04/16 CXR: 08/14/16  No complaints of chest pain, SOB or signs of infection at PAT appointment.

## 2016-08-15 LAB — URINE CULTURE: Culture: NO GROWTH

## 2016-08-18 MED ORDER — DEXTROSE 5 % IV SOLN
3.0000 g | INTRAVENOUS | Status: AC
  Administered 2016-08-21 (×2): 3 g via INTRAVENOUS
  Filled 2016-08-18: qty 3000

## 2016-08-20 NOTE — Anesthesia Preprocedure Evaluation (Addendum)
Anesthesia Evaluation  Patient identified by MRN, date of birth, ID band Patient awake    Reviewed: Allergy & Precautions, NPO status , Patient's Chart, lab work & pertinent test results  Airway Mallampati: II  TM Distance: >3 FB Neck ROM: Limited    Dental  (+) Edentulous Upper, Edentulous Lower   Pulmonary Current Smoker,    breath sounds clear to auscultation       Cardiovascular hypertension, Pt. on medications  Rhythm:Regular Rate:Normal     Neuro/Psych negative neurological ROS     GI/Hepatic negative GI ROS, Neg liver ROS,   Endo/Other  diabetesMorbid obesity  Renal/GU negative Renal ROS     Musculoskeletal  (+) Arthritis ,   Abdominal   Peds  Hematology negative hematology ROS (+)   Anesthesia Other Findings   Reproductive/Obstetrics                           Lab Results  Component Value Date   WBC 7.7 08/14/2016   HGB 14.5 08/14/2016   HCT 44.6 08/14/2016   MCV 92.5 08/14/2016   PLT 174 08/14/2016   Lab Results  Component Value Date   CREATININE 1.09 08/14/2016   BUN 10 08/14/2016   NA 138 08/14/2016   K 4.3 08/14/2016   CL 102 08/14/2016   CO2 27 08/14/2016    Anesthesia Physical Anesthesia Plan  ASA: III  Anesthesia Plan: General and Regional   Post-op Pain Management:  Regional for Post-op pain   Induction: Intravenous  Airway Management Planned: Oral ETT  Additional Equipment:   Intra-op Plan:   Post-operative Plan: Extubation in OR  Informed Consent: I have reviewed the patients History and Physical, chart, labs and discussed the procedure including the risks, benefits and alternatives for the proposed anesthesia with the patient or authorized representative who has indicated his/her understanding and acceptance.   Dental advisory given  Plan Discussed with: CRNA  Anesthesia Plan Comments:         Anesthesia Quick Evaluation

## 2016-08-21 ENCOUNTER — Inpatient Hospital Stay (HOSPITAL_COMMUNITY)
Admission: RE | Admit: 2016-08-21 | Discharge: 2016-08-22 | DRG: 483 | Disposition: A | Discharge status: Home / Self Care | Payer: Medicare Other | Source: Ambulatory Visit | Attending: Orthopedic Surgery | Admitting: Orthopedic Surgery

## 2016-08-21 ENCOUNTER — Encounter (HOSPITAL_COMMUNITY)
Admission: RE | Disposition: A | Discharge status: Home / Self Care | Payer: Self-pay | Source: Ambulatory Visit | Attending: Orthopedic Surgery

## 2016-08-21 ENCOUNTER — Observation Stay (HOSPITAL_COMMUNITY): Payer: Medicare Other

## 2016-08-21 ENCOUNTER — Inpatient Hospital Stay (HOSPITAL_COMMUNITY): Payer: Medicare Other | Admitting: Anesthesiology

## 2016-08-21 ENCOUNTER — Encounter (HOSPITAL_COMMUNITY): Payer: Self-pay | Admitting: Anesthesiology

## 2016-08-21 DIAGNOSIS — Z96652 Presence of left artificial knee joint: Secondary | ICD-10-CM | POA: Diagnosis not present

## 2016-08-21 DIAGNOSIS — E785 Hyperlipidemia, unspecified: Secondary | ICD-10-CM | POA: Diagnosis present

## 2016-08-21 DIAGNOSIS — G8918 Other acute postprocedural pain: Secondary | ICD-10-CM | POA: Diagnosis not present

## 2016-08-21 DIAGNOSIS — I10 Essential (primary) hypertension: Secondary | ICD-10-CM | POA: Diagnosis not present

## 2016-08-21 DIAGNOSIS — Z7984 Long term (current) use of oral hypoglycemic drugs: Secondary | ICD-10-CM | POA: Diagnosis not present

## 2016-08-21 DIAGNOSIS — Z96612 Presence of left artificial shoulder joint: Secondary | ICD-10-CM | POA: Diagnosis not present

## 2016-08-21 DIAGNOSIS — F17210 Nicotine dependence, cigarettes, uncomplicated: Secondary | ICD-10-CM | POA: Diagnosis present

## 2016-08-21 DIAGNOSIS — Z96643 Presence of artificial hip joint, bilateral: Secondary | ICD-10-CM | POA: Diagnosis not present

## 2016-08-21 DIAGNOSIS — E119 Type 2 diabetes mellitus without complications: Secondary | ICD-10-CM | POA: Diagnosis not present

## 2016-08-21 DIAGNOSIS — Z471 Aftercare following joint replacement surgery: Secondary | ICD-10-CM | POA: Diagnosis not present

## 2016-08-21 DIAGNOSIS — Z791 Long term (current) use of non-steroidal anti-inflammatories (NSAID): Secondary | ICD-10-CM | POA: Diagnosis not present

## 2016-08-21 DIAGNOSIS — M19019 Primary osteoarthritis, unspecified shoulder: Secondary | ICD-10-CM | POA: Diagnosis present

## 2016-08-21 DIAGNOSIS — Z6841 Body Mass Index (BMI) 40.0 and over, adult: Secondary | ICD-10-CM

## 2016-08-21 DIAGNOSIS — M25512 Pain in left shoulder: Secondary | ICD-10-CM | POA: Diagnosis not present

## 2016-08-21 DIAGNOSIS — M19012 Primary osteoarthritis, left shoulder: Principal | ICD-10-CM | POA: Diagnosis present

## 2016-08-21 HISTORY — PX: TOTAL SHOULDER ARTHROPLASTY: SHX126

## 2016-08-21 HISTORY — DX: Type 2 diabetes mellitus without complications: E11.9

## 2016-08-21 HISTORY — DX: Unspecified fracture of left lower leg, initial encounter for closed fracture: S82.92XA

## 2016-08-21 HISTORY — PX: TOTAL SHOULDER REPLACEMENT: SUR1217

## 2016-08-21 LAB — GLUCOSE, CAPILLARY
GLUCOSE-CAPILLARY: 133 mg/dL — AB (ref 65–99)
GLUCOSE-CAPILLARY: 109 mg/dL — AB (ref 65–99)
Glucose-Capillary: 119 mg/dL — ABNORMAL HIGH (ref 65–99)

## 2016-08-21 SURGERY — ARTHROPLASTY, SHOULDER, TOTAL
Anesthesia: Regional | Site: Shoulder | Laterality: Left

## 2016-08-21 MED ORDER — ONDANSETRON HCL 4 MG/2ML IJ SOLN
INTRAMUSCULAR | Status: AC
  Filled 2016-08-21: qty 2

## 2016-08-21 MED ORDER — OXYBUTYNIN CHLORIDE 5 MG PO TABS
5.0000 mg | ORAL_TABLET | Freq: Two times a day (BID) | ORAL | Status: DC
  Administered 2016-08-21 – 2016-08-22 (×2): 5 mg via ORAL
  Filled 2016-08-21 (×3): qty 1

## 2016-08-21 MED ORDER — PHENYLEPHRINE HCL 10 MG/ML IJ SOLN
INTRAMUSCULAR | Status: AC
  Filled 2016-08-21: qty 1

## 2016-08-21 MED ORDER — FENTANYL CITRATE (PF) 100 MCG/2ML IJ SOLN
INTRAMUSCULAR | Status: AC
  Filled 2016-08-21: qty 2

## 2016-08-21 MED ORDER — PROPOFOL 10 MG/ML IV BOLUS
INTRAVENOUS | Status: DC | PRN
  Administered 2016-08-21: 200 mg via INTRAVENOUS

## 2016-08-21 MED ORDER — LOSARTAN POTASSIUM 50 MG PO TABS
100.0000 mg | ORAL_TABLET | Freq: Every day | ORAL | Status: DC
  Administered 2016-08-21 – 2016-08-22 (×2): 100 mg via ORAL
  Filled 2016-08-21 (×2): qty 2

## 2016-08-21 MED ORDER — LIDOCAINE HCL (CARDIAC) 20 MG/ML IV SOLN
INTRAVENOUS | Status: DC | PRN
  Administered 2016-08-21: 20 mg via INTRAVENOUS

## 2016-08-21 MED ORDER — PHENYLEPHRINE HCL 10 MG/ML IJ SOLN
INTRAMUSCULAR | Status: DC | PRN
  Administered 2016-08-21: 40 ug via INTRAVENOUS
  Administered 2016-08-21: 80 ug via INTRAVENOUS
  Administered 2016-08-21: 120 ug via INTRAVENOUS

## 2016-08-21 MED ORDER — ONDANSETRON HCL 4 MG/2ML IJ SOLN: 4.0000 mg | Freq: Four times a day (QID) | INTRAMUSCULAR | Status: DC | PRN

## 2016-08-21 MED ORDER — PROPOFOL 10 MG/ML IV BOLUS
INTRAVENOUS | Status: AC
  Filled 2016-08-21: qty 20

## 2016-08-21 MED ORDER — EPHEDRINE SULFATE 50 MG/ML IJ SOLN
INTRAMUSCULAR | Status: DC | PRN
  Administered 2016-08-21 (×3): 5 mg via INTRAVENOUS

## 2016-08-21 MED ORDER — PHENOL 1.4 % MT LIQD: 1.0000 | OROMUCOSAL | Status: DC | PRN

## 2016-08-21 MED ORDER — CEFAZOLIN SODIUM-DEXTROSE 2-4 GM/100ML-% IV SOLN
2.0000 g | Freq: Four times a day (QID) | INTRAVENOUS | Status: AC
  Administered 2016-08-21 – 2016-08-22 (×2): 2 g via INTRAVENOUS
  Filled 2016-08-21 (×2): qty 100

## 2016-08-21 MED ORDER — MENTHOL 3 MG MT LOZG: 1.0000 | LOZENGE | OROMUCOSAL | Status: DC | PRN

## 2016-08-21 MED ORDER — METHOCARBAMOL 500 MG PO TABS
500.0000 mg | ORAL_TABLET | Freq: Every day | ORAL | Status: DC | PRN
  Administered 2016-08-21 – 2016-08-22 (×3): 500 mg via ORAL
  Filled 2016-08-21 (×2): qty 1

## 2016-08-21 MED ORDER — HYDROMORPHONE HCL 1 MG/ML IJ SOLN
INTRAMUSCULAR | Status: AC
  Filled 2016-08-21: qty 0.5

## 2016-08-21 MED ORDER — HYDROMORPHONE HCL 1 MG/ML IJ SOLN
0.2500 mg | INTRAMUSCULAR | Status: DC | PRN
  Administered 2016-08-21: 0.5 mg via INTRAVENOUS

## 2016-08-21 MED ORDER — MAGNESIUM 200 MG PO TABS
200.0000 mg | ORAL_TABLET | Freq: Every day | ORAL | Status: DC
  Filled 2016-08-21: qty 1

## 2016-08-21 MED ORDER — SUGAMMADEX SODIUM 200 MG/2ML IV SOLN
INTRAVENOUS | Status: AC
  Filled 2016-08-21: qty 2

## 2016-08-21 MED ORDER — BUPIVACAINE-EPINEPHRINE (PF) 0.5% -1:200000 IJ SOLN
INTRAMUSCULAR | Status: DC | PRN
  Administered 2016-08-21: 25 mL via PERINEURAL

## 2016-08-21 MED ORDER — ROCURONIUM BROMIDE 10 MG/ML (PF) SYRINGE
PREFILLED_SYRINGE | INTRAVENOUS | Status: AC
  Filled 2016-08-21: qty 10

## 2016-08-21 MED ORDER — FENTANYL CITRATE (PF) 100 MCG/2ML IJ SOLN
INTRAMUSCULAR | Status: DC | PRN
  Administered 2016-08-21 (×3): 50 ug via INTRAVENOUS

## 2016-08-21 MED ORDER — METFORMIN HCL 500 MG PO TABS
500.0000 mg | ORAL_TABLET | Freq: Two times a day (BID) | ORAL | Status: DC
  Administered 2016-08-21 – 2016-08-22 (×2): 500 mg via ORAL
  Filled 2016-08-21 (×2): qty 1

## 2016-08-21 MED ORDER — CEFAZOLIN SODIUM 1 G IJ SOLR
INTRAMUSCULAR | Status: AC
  Filled 2016-08-21: qty 30

## 2016-08-21 MED ORDER — MIDAZOLAM HCL 5 MG/5ML IJ SOLN
INTRAMUSCULAR | Status: DC | PRN
  Administered 2016-08-21: 2 mg via INTRAVENOUS

## 2016-08-21 MED ORDER — OXYCODONE HCL 5 MG PO TABS
ORAL_TABLET | ORAL | Status: AC
  Filled 2016-08-21: qty 2

## 2016-08-21 MED ORDER — HEMOSTATIC AGENTS (NO CHARGE) OPTIME
TOPICAL | Status: DC | PRN
  Administered 2016-08-21: 1 via TOPICAL

## 2016-08-21 MED ORDER — GLYCOPYRROLATE 0.2 MG/ML IJ SOLN
INTRAMUSCULAR | Status: DC | PRN
  Administered 2016-08-21: 0.4 mg via INTRAVENOUS

## 2016-08-21 MED ORDER — LACTATED RINGERS IV SOLN
INTRAVENOUS | Status: DC | PRN
  Administered 2016-08-21 (×2): via INTRAVENOUS

## 2016-08-21 MED ORDER — METOCLOPRAMIDE HCL 5 MG/ML IJ SOLN: 5.0000 mg | Freq: Three times a day (TID) | INTRAMUSCULAR | Status: DC | PRN

## 2016-08-21 MED ORDER — METHOCARBAMOL 500 MG PO TABS
ORAL_TABLET | ORAL | Status: AC
  Filled 2016-08-21: qty 1

## 2016-08-21 MED ORDER — LIDOCAINE 2% (20 MG/ML) 5 ML SYRINGE
INTRAMUSCULAR | Status: AC
  Filled 2016-08-21: qty 5

## 2016-08-21 MED ORDER — PROMETHAZINE HCL 25 MG/ML IJ SOLN: 6.2500 mg | INTRAMUSCULAR | Status: DC | PRN

## 2016-08-21 MED ORDER — SODIUM CHLORIDE 0.9 % IJ SOLN
INTRAMUSCULAR | Status: AC
  Filled 2016-08-21: qty 20

## 2016-08-21 MED ORDER — SUGAMMADEX SODIUM 200 MG/2ML IV SOLN
INTRAVENOUS | Status: DC | PRN
  Administered 2016-08-21: 200 mg via INTRAVENOUS

## 2016-08-21 MED ORDER — ROCURONIUM BROMIDE 100 MG/10ML IV SOLN
INTRAVENOUS | Status: DC | PRN
  Administered 2016-08-21: 60 mg via INTRAVENOUS

## 2016-08-21 MED ORDER — SODIUM CHLORIDE 0.9 % IR SOLN
Status: DC | PRN
Start: 2016-08-21 — End: 2016-08-21
  Administered 2016-08-21: 5000 mL

## 2016-08-21 MED ORDER — PRAVASTATIN SODIUM 40 MG PO TABS
40.0000 mg | ORAL_TABLET | Freq: Every day | ORAL | Status: DC
  Administered 2016-08-21: 40 mg via ORAL
  Filled 2016-08-21: qty 1

## 2016-08-21 MED ORDER — ASPIRIN EC 325 MG PO TBEC
325.0000 mg | DELAYED_RELEASE_TABLET | Freq: Every day | ORAL | Status: DC
  Administered 2016-08-21 – 2016-08-22 (×2): 325 mg via ORAL
  Filled 2016-08-21 (×2): qty 1

## 2016-08-21 MED ORDER — VANCOMYCIN HCL 500 MG IV SOLR
INTRAVENOUS | Status: AC
  Filled 2016-08-21: qty 500

## 2016-08-21 MED ORDER — POTASSIUM CHLORIDE IN NACL 20-0.9 MEQ/L-% IV SOLN
INTRAVENOUS | Status: DC
  Administered 2016-08-21: 17:00:00 via INTRAVENOUS
  Filled 2016-08-21: qty 1000

## 2016-08-21 MED ORDER — MIDAZOLAM HCL 2 MG/2ML IJ SOLN
INTRAMUSCULAR | Status: AC
  Filled 2016-08-21: qty 2

## 2016-08-21 MED ORDER — METOCLOPRAMIDE HCL 5 MG PO TABS: 5.0000 mg | ORAL_TABLET | Freq: Three times a day (TID) | ORAL | Status: DC | PRN

## 2016-08-21 MED ORDER — ACETAMINOPHEN 325 MG PO TABS: 650.0000 mg | ORAL_TABLET | Freq: Four times a day (QID) | ORAL | Status: DC | PRN

## 2016-08-21 MED ORDER — ONDANSETRON HCL 4 MG PO TABS: 4.0000 mg | ORAL_TABLET | Freq: Four times a day (QID) | ORAL | Status: DC | PRN

## 2016-08-21 MED ORDER — OXYCODONE HCL 5 MG PO TABS
5.0000 mg | ORAL_TABLET | ORAL | Status: DC | PRN
  Administered 2016-08-21 – 2016-08-22 (×4): 10 mg via ORAL
  Filled 2016-08-21 (×4): qty 2

## 2016-08-21 MED ORDER — MORPHINE SULFATE (PF) 4 MG/ML IV SOLN: 4.0000 mg | INTRAVENOUS | Status: DC | PRN

## 2016-08-21 MED ORDER — VANCOMYCIN HCL 500 MG IV SOLR
INTRAVENOUS | Status: DC | PRN
  Administered 2016-08-21: 500 mg

## 2016-08-21 MED ORDER — BUPIVACAINE HCL (PF) 0.5 % IJ SOLN
INTRAMUSCULAR | Status: AC
  Filled 2016-08-21: qty 30

## 2016-08-21 MED ORDER — ACETAMINOPHEN 650 MG RE SUPP: 650.0000 mg | Freq: Four times a day (QID) | RECTAL | Status: DC | PRN

## 2016-08-21 MED ORDER — PHENYLEPHRINE HCL 10 MG/ML IJ SOLN
INTRAMUSCULAR | Status: DC | PRN
  Administered 2016-08-21: 25 ug/min via INTRAVENOUS

## 2016-08-21 SURGICAL SUPPLY — 77 items
BLADE SAW SGTL 13X75X1.27 (BLADE) ×3 IMPLANT
BONE CEMENT PALACOSE (Orthopedic Implant) ×3 IMPLANT
CAPT SHLDR TOTAL 2 ×3 IMPLANT
CEMENT BONE PALACOSE (Orthopedic Implant) ×1 IMPLANT
CLEANER TIP ELECTROSURG 2X2 (MISCELLANEOUS) ×3 IMPLANT
CLOSURE WOUND 1/2 X4 (GAUZE/BANDAGES/DRESSINGS) ×1
COVER SURGICAL LIGHT HANDLE (MISCELLANEOUS) ×3 IMPLANT
COVER TABLE BACK 60X90 (DRAPES) IMPLANT
DRAPE C-ARM 42X72 X-RAY (DRAPES) IMPLANT
DRAPE IMP U-DRAPE 54X76 (DRAPES) ×3 IMPLANT
DRAPE INCISE IOBAN 66X45 STRL (DRAPES) ×6 IMPLANT
DRAPE U-SHAPE 47X51 STRL (DRAPES) ×6 IMPLANT
DRSG AQUACEL AG ADV 3.5X10 (GAUZE/BANDAGES/DRESSINGS) ×3 IMPLANT
DRSG PAD ABDOMINAL 8X10 ST (GAUZE/BANDAGES/DRESSINGS) ×3 IMPLANT
DURAPREP 26ML APPLICATOR (WOUND CARE) ×3 IMPLANT
ELECT BLADE 6.5 EXT (BLADE) IMPLANT
ELECT REM PT RETURN 9FT ADLT (ELECTROSURGICAL) ×3
ELECTRODE REM PT RTRN 9FT ADLT (ELECTROSURGICAL) ×1 IMPLANT
EVACUATOR 1/8 PVC DRAIN (DRAIN) IMPLANT
GAUZE SPONGE 4X4 12PLY STRL (GAUZE/BANDAGES/DRESSINGS) ×3 IMPLANT
GLOVE BIOGEL M 6.5 STRL (GLOVE) ×3 IMPLANT
GLOVE BIOGEL M 7.0 STRL (GLOVE) ×3 IMPLANT
GLOVE BIOGEL PI IND STRL 6.5 (GLOVE) ×3 IMPLANT
GLOVE BIOGEL PI IND STRL 7.0 (GLOVE) ×3 IMPLANT
GLOVE BIOGEL PI IND STRL 7.5 (GLOVE) ×1 IMPLANT
GLOVE BIOGEL PI IND STRL 8 (GLOVE) ×1 IMPLANT
GLOVE BIOGEL PI IND STRL 8.5 (GLOVE) ×1 IMPLANT
GLOVE BIOGEL PI INDICATOR 6.5 (GLOVE) ×6
GLOVE BIOGEL PI INDICATOR 7.0 (GLOVE) ×6
GLOVE BIOGEL PI INDICATOR 7.5 (GLOVE) ×2
GLOVE BIOGEL PI INDICATOR 8 (GLOVE) ×2
GLOVE BIOGEL PI INDICATOR 8.5 (GLOVE) ×2
GLOVE ECLIPSE 7.0 STRL STRAW (GLOVE) ×3 IMPLANT
GLOVE ECLIPSE 8.5 STRL (GLOVE) ×3 IMPLANT
GLOVE SURG ORTHO 8.0 STRL STRW (GLOVE) ×3 IMPLANT
GOWN STRL REUS W/ TWL LRG LVL3 (GOWN DISPOSABLE) ×6 IMPLANT
GOWN STRL REUS W/ TWL XL LVL3 (GOWN DISPOSABLE) ×1 IMPLANT
GOWN STRL REUS W/TWL LRG LVL3 (GOWN DISPOSABLE) ×12
GOWN STRL REUS W/TWL XL LVL3 (GOWN DISPOSABLE) ×2
KIT BASIN OR (CUSTOM PROCEDURE TRAY) ×3 IMPLANT
KIT ROOM TURNOVER OR (KITS) ×3 IMPLANT
LOOP VESSEL MAXI BLUE (MISCELLANEOUS) ×3 IMPLANT
MANIFOLD NEPTUNE II (INSTRUMENTS) ×3 IMPLANT
MARKER SKIN DUAL TIP RULER LAB (MISCELLANEOUS) ×3 IMPLANT
NDL SUT 6 .5 CRC .975X.05 MAYO (NEEDLE) ×1 IMPLANT
NEEDLE HYPO 25GX1X1/2 BEV (NEEDLE) IMPLANT
NEEDLE MAYO TAPER (NEEDLE) ×2
NS IRRIG 1000ML POUR BTL (IV SOLUTION) ×15 IMPLANT
PACK SHOULDER (CUSTOM PROCEDURE TRAY) ×3 IMPLANT
PACK UNIVERSAL I (CUSTOM PROCEDURE TRAY) IMPLANT
PAD ARMBOARD 7.5X6 YLW CONV (MISCELLANEOUS) ×6 IMPLANT
PIN HUMERAL STMN 3.2MMX9IN (INSTRUMENTS) ×3 IMPLANT
PIN STEINMANN THREADED TIP (PIN) ×3 IMPLANT
SMARTMIX MINI TOWER (MISCELLANEOUS) ×3
SPONGE LAP 18X18 X RAY DECT (DISPOSABLE) ×9 IMPLANT
STRIP CLOSURE SKIN 1/2X4 (GAUZE/BANDAGES/DRESSINGS) ×2 IMPLANT
SUCTION FRAZIER HANDLE 10FR (MISCELLANEOUS) ×2
SUCTION TUBE FRAZIER 10FR DISP (MISCELLANEOUS) ×1 IMPLANT
SUT FIBERWIRE #2 38 T-5 BLUE (SUTURE) ×3
SUT MAXBRAID (SUTURE) ×12 IMPLANT
SUT MNCRL AB 3-0 PS2 18 (SUTURE) ×3 IMPLANT
SUT PROLENE 3 0 PS 2 (SUTURE) ×3 IMPLANT
SUT SILK 2 0 (SUTURE) ×2
SUT SILK 2-0 18XBRD TIE 12 (SUTURE) ×1 IMPLANT
SUT VIC AB 0 CTB1 27 (SUTURE) ×6 IMPLANT
SUT VIC AB 1 CT1 27 (SUTURE) ×6
SUT VIC AB 1 CT1 27XBRD ANBCTR (SUTURE) ×3 IMPLANT
SUT VIC AB 2-0 CT1 27 (SUTURE) ×4
SUT VIC AB 2-0 CT1 TAPERPNT 27 (SUTURE) ×2 IMPLANT
SUT VICRYL 0 UR6 27IN ABS (SUTURE) ×9 IMPLANT
SUTURE FIBERWR #2 38 T-5 BLUE (SUTURE) ×1 IMPLANT
SYR CONTROL 10ML LL (SYRINGE) ×3 IMPLANT
TOWEL OR 17X24 6PK STRL BLUE (TOWEL DISPOSABLE) ×3 IMPLANT
TOWEL OR 17X26 10 PK STRL BLUE (TOWEL DISPOSABLE) ×3 IMPLANT
TOWER SMARTMIX MINI (MISCELLANEOUS) ×1 IMPLANT
TRAY FOLEY BAG SILVER LF 16FR (CATHETERS) ×3 IMPLANT
WATER STERILE IRR 1000ML POUR (IV SOLUTION) IMPLANT

## 2016-08-21 NOTE — Anesthesia Procedure Notes (Signed)
Procedure Name: Intubation Date/Time: 08/21/2016 7:40 AM Performed by: Kyung Rudd Pre-anesthesia Checklist: Patient identified, Emergency Drugs available, Suction available and Patient being monitored Patient Re-evaluated:Patient Re-evaluated prior to inductionOxygen Delivery Method: Circle system utilized Preoxygenation: Pre-oxygenation with 100% oxygen Intubation Type: IV induction Ventilation: Mask ventilation without difficulty Laryngoscope Size: Mac and 4 Grade View: Grade I Tube type: Oral Tube size: 7.5 mm Number of attempts: 1 Airway Equipment and Method: Stylet Placement Confirmation: ETT inserted through vocal cords under direct vision,  positive ETCO2 and breath sounds checked- equal and bilateral Secured at: 22 cm Tube secured with: Tape Dental Injury: Teeth and Oropharynx as per pre-operative assessment

## 2016-08-21 NOTE — Progress Notes (Addendum)
  Adam Barrera is a 65 y.o. male patient admitted from PACU awake, alert - oriented  X 4 - no acute distress noted.  VSS - Blood pressure 109/78, pulse 92, temperature 97.5 F (36.4 C), temperature source Oral, resp. rate 16, height 5\' 10"  (1.778 m), weight 133.7 kg (294 lb 11 oz), SpO2 97 %.    IV in place, occlusive dsg intact without redness.  Orientation to room, and floor completed.  Admission INP armband ID verified with patient/family, and in place.   SR up x 2, fall assessment complete, with patient and family able to verbalize understanding of risk associated with falls, and verbalized understanding to call nsg before up out of bed.  Call light within reach, patient able to voice, and demonstrate understanding. No evidence of skin break down noted on exam.  Admission nurse notified of admission.     Will cont to eval and treat per MD orders.  Theora Master, RN 08/21/2016 2:49 PM

## 2016-08-21 NOTE — Anesthesia Procedure Notes (Signed)
Anesthesia Regional Block:  Interscalene brachial plexus block  Pre-Anesthetic Checklist: ,, timeout performed, Correct Patient, Correct Site, Correct Laterality, Correct Procedure, Correct Position, site marked, Risks and benefits discussed,  Surgical consent,  Pre-op evaluation,  At surgeon's request and post-op pain management  Laterality: Left  Prep: chloraprep       Needles:  Injection technique: Single-shot  Needle Type: Echogenic Needle     Needle Length: 5cm 5 cm Needle Gauge: 21 and 21 G    Additional Needles:  Procedures: ultrasound guided (picture in chart) and nerve stimulator Interscalene brachial plexus block  Nerve Stimulator or Paresthesia:  Response: Deltoid, 0.5 mA,   Additional Responses:   Narrative:  Start time: 08/21/2016 7:10 AM End time: 08/21/2016 7:17 AM Injection made incrementally with aspirations every 5 mL.  Performed by: Personally  Anesthesiologist: Suzette Battiest

## 2016-08-21 NOTE — Transfer of Care (Signed)
Immediate Anesthesia Transfer of Care Note  Patient: Adam Barrera  Procedure(s) Performed: Procedure(s): TOTAL SHOULDER ARTHROPLASTY (Left)  Patient Location: PACU  Anesthesia Type:GA combined with regional for post-op pain  Level of Consciousness: awake, alert  and oriented  Airway & Oxygen Therapy: Patient Spontanous Breathing and Patient connected to nasal cannula oxygen  Post-op Assessment: Report given to RN, Post -op Vital signs reviewed and stable and Patient moving all extremities  Post vital signs: Reviewed and stable  Last Vitals:  Vitals:   08/21/16 1223 08/21/16 1224  BP: 101/77   Pulse: (!) 116   Resp: (!) 23   Temp:  36.4 C    Last Pain:  Vitals:   08/21/16 1224  TempSrc:   PainSc: 0-No pain         Complications: No apparent anesthesia complications

## 2016-08-21 NOTE — Brief Op Note (Signed)
08/21/2016  12:18 PM  PATIENT:  Zenovia Jarred Leachman  65 y.o. male  PRE-OPERATIVE DIAGNOSIS:  Left Shoulder Osteoarthritis  POST-OPERATIVE DIAGNOSIS:  Left Shoulder Osteoarthritis  PROCEDURE:  Procedure(s): TOTAL SHOULDER ARTHROPLASTY  SURGEON:  Surgeon(s): Meredith Pel, MD  ASSISTANT: Laure Kidney rnfa  ANESTHESIA:   general  EBL: 200 ml    Total I/O In: 1000 [I.V.:1000] Out: 435 [Urine:235; Blood:200]  BLOOD ADMINISTERED: none  DRAINS: none   LOCAL MEDICATIONS USED:  none  SPECIMEN:  No Specimen  COUNTS:  YES  TOURNIQUET:  * No tourniquets in log *  DICTATION: .Other Dictation: Dictation Number [id number]  PLAN OF CARE: Admit for overnight observation  PATIENT DISPOSITION:  PACU - hemodynamically stable
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
## 2016-08-21 NOTE — Anesthesia Postprocedure Evaluation (Signed)
Anesthesia Post Note  Patient: Adam Barrera  Procedure(s) Performed: Procedure(s) (LRB): TOTAL SHOULDER ARTHROPLASTY (Left)  Patient location during evaluation: PACU Anesthesia Type: General and Regional Level of consciousness: awake and alert Pain management: pain level controlled Vital Signs Assessment: post-procedure vital signs reviewed and stable Respiratory status: spontaneous breathing, nonlabored ventilation, respiratory function stable and patient connected to nasal cannula oxygen Cardiovascular status: blood pressure returned to baseline and stable Postop Assessment: no signs of nausea or vomiting Anesthetic complications: no    Last Vitals:  Vitals:   08/21/16 1402 08/21/16 1407  BP: 110/83 112/75  Pulse: 99 98  Resp: (!) 25 17  Temp:      Last Pain:  Vitals:   08/21/16 1330  TempSrc:   PainSc: 8                  Tiajuana Amass

## 2016-08-22 ENCOUNTER — Encounter (HOSPITAL_COMMUNITY): Payer: Self-pay | Admitting: Orthopedic Surgery

## 2016-08-22 MED ORDER — METHOCARBAMOL 500 MG PO TABS: 500.0000 mg | ORAL_TABLET | Freq: Every day | ORAL | 0 refills | Status: DC | PRN

## 2016-08-22 MED ORDER — OXYCODONE HCL 5 MG PO TABS: 5.0000 mg | ORAL_TABLET | ORAL | 0 refills | Status: DC | PRN

## 2016-08-22 MED ORDER — MAGNESIUM OXIDE 400 (241.3 MG) MG PO TABS
400.0000 mg | ORAL_TABLET | Freq: Every day | ORAL | Status: DC
  Administered 2016-08-22: 400 mg via ORAL
  Filled 2016-08-22: qty 1

## 2016-08-22 NOTE — Evaluation (Addendum)
Occupational Therapy Evaluation/Discharge Patient Details Name: Adam Barrera MRN: [id number] DOB: 20-Mar-1951 Today's Date: 08/22/2016    History of Present Illness Pt is a 65 y.o. male s/p L total shoulder arthroplasty. Pt has a past medical history significant for arthritis, diabetes mellitus without complication, hyperlipidemia, hypertension, hyperplastic colon polyp, obesity, and urinary incontinence. Pt has a past surgical history significant for R total knee arthroplasty, L total knee arthroplasty, L total knee arthroplasty with revision components, and R total hip arthroplasty.   Clinical Impression   PTA, pt was independent with all ADL and IADL. Pt currently requires supervision for functional mobility, min guard assist for shower transfer, and min assist for UB and LB dressing to adhere to shoulder precautions. Pt lives with his wife and adult daughter and will have 65 hour assistance post-acute D/C. All education complete concerning passive shoulder protocol, dressing techniques, elbow/wrist/hand AROM, pendulums, sling wear, and safety with ADL post-operatively. Pt verbalizes and demonstrates understanding of all and is able to instruct caregiver in assisting with PROM techniques. Pt reports no further questions/concerns. Acute OT will sign off and recommend follow-up per MD for continued rehabilitation needs.    Follow Up Recommendations  No OT follow up;Other (comment);Supervision/Assistance - 24 hour (Follow-up per MD)    Equipment Recommendations  None recommended by OT    Recommendations for Other Services       Precautions / Restrictions Precautions Precautions: Shoulder Type of Shoulder Precautions: Passive protocol: sling for comfort and sleep, NWB LUE, no AROM LUE, elbow/wrist/hand AROM, OK for pendulums, passive shoulder ROM (forward flexion: 0-90, abduction: 0-60, external rotation 0-30) Shoulder Interventions: Shoulder sling/immobilizer (L for comfort and  sleep) Precaution Booklet Issued: Yes (comment) Precaution Comments: Reviewed passive shoulder protocol exercises, dressing techniques, bathing techniques, bed mobility, and sling wear schedule. Required Braces or Orthoses: Sling (L shoulder sling/immobilizer) Restrictions Weight Bearing Restrictions: Yes LUE Weight Bearing: Non weight bearing Other Position/Activity Restrictions: No active movement L shoulder      Mobility Bed Mobility Overal bed mobility: Modified Independent                Transfers Overall transfer level: Needs assistance   Transfers: Sit to/from Stand Sit to Stand: Supervision         General transfer comment: Supervision for safety.    Balance Overall balance assessment: Needs assistance Sitting-balance support: No upper extremity supported;Feet supported Sitting balance-Leahy Scale: Good     Standing balance support: No upper extremity supported;During functional activity Standing balance-Leahy Scale: Good Standing balance comment: Supervision for safety with dynamic and static activity                            ADL Overall ADL's : Needs assistance/impaired     Grooming: Wash/dry hands;Supervision/safety;Standing   Upper Body Bathing: Supervision/ safety;Sitting;Cueing for UE precautions   Lower Body Bathing: Minimal assistance;Sit to/from stand Lower Body Bathing Details (indicate cue type and reason): Min assist to wash feet. Upper Body Dressing : Cueing for UE precautions;Sitting;Minimal assistance   Lower Body Dressing: Minimal assistance;Sit to/from stand Lower Body Dressing Details (indicate cue type and reason): Min assist to don/doff socks.  Toilet Transfer: Supervision/safety;Ambulation;Regular Toilet   Toileting- Water quality scientist and Hygiene: Supervision/safety;Sit to/from stand   Tub/ Shower Transfer: Min guard;Ambulation   Functional mobility during ADLs: Supervision/safety;Min guard (S for functional  ambulation, min guard shower transfer) General ADL Comments: Educated pt on dressing, bathing, grooming techniques while adhering to passive shoulder  protocol precautions.     Vision Vision Assessment?: No apparent visual deficits   Perception     Praxis      Pertinent Vitals/Pain Pain Assessment: 0-10 Pain Score: 5  Pain Location: L shoulder Pain Descriptors / Indicators: Aching;Operative site guarding;Sore Pain Intervention(s): Monitored during session;Repositioned;Ice applied     Hand Dominance Right   Extremity/Trunk Assessment Upper Extremity Assessment Upper Extremity Assessment: LUE deficits/detail LUE Deficits / Details: Decreased strength and ROM as expected post-operatively. Limited to passive motion only. LUE: Unable to fully assess due to immobilization   Lower Extremity Assessment Lower Extremity Assessment: Overall WFL for tasks assessed (slightly shuffling gait at baseline; previous TKA (B), R THA)       Communication Communication Communication: No difficulties   Cognition Arousal/Alertness: Awake/alert Behavior During Therapy: WFL for tasks assessed/performed Overall Cognitive Status: Within Functional Limits for tasks assessed                     General Comments       Exercises Exercises: Shoulder     Shoulder Instructions Shoulder Instructions Donning/doffing shirt without moving shoulder: Minimal assistance Method for sponge bathing under operated UE: Supervision/safety Donning/doffing sling/immobilizer: Min-guard Correct positioning of sling/immobilizer: Supervision/safety Pendulum exercises (written home exercise program): Supervision/safety ROM for elbow, wrist and digits of operated UE: Supervision/safety Sling wearing schedule (on at all times/off for ADL's): Supervision/safety Proper positioning of operated UE when showering: Supervision/safety Positioning of UE while sleeping: Supervision/safety    Home Living  Family/patient expects to be discharged to:: Private residence Living Arrangements: Spouse/significant other;Children Available Help at Discharge: Family Type of Home: House Home Access: Stairs to enter Technical brewer of Steps: 5 Entrance Stairs-Rails: Left Home Layout: Multi-level Alternate Level Stairs-Number of Steps: 5 Alternate Level Stairs-Rails: Left Bathroom Shower/Tub: Occupational psychologist: Standard     Home Equipment: Environmental consultant - 2 wheels;Cane - single point;Bedside commode          Prior Functioning/Environment Level of Independence: Independent                 OT Problem List: Decreased strength;Decreased range of motion;Decreased activity tolerance;Impaired balance (sitting and/or standing);Pain;Impaired UE functional use   OT Treatment/Interventions:      OT Goals(Current goals can be found in the care plan section) Acute Rehab OT Goals Patient Stated Goal: to go home today OT Goal Formulation: With patient Time For Goal Achievement: 09/05/16 Potential to Achieve Goals: Good  OT Frequency:     Barriers to D/C:            Co-evaluation              End of Session Equipment Utilized During Treatment: Gait belt (L shoulder sling) Nurse Communication: Mobility status  Activity Tolerance: Patient tolerated treatment well Patient left: in chair;with call bell/phone within reach   Time: 0753-0838 OT Time Calculation (min): 45 min Charges:  OT General Charges $OT Visit: 1 Procedure OT Evaluation $OT Eval Moderate Complexity: 1 Procedure OT Treatments $Self Care/Home Management : 23-37 mins  Norman Herrlich, OTR/L [id number] 08/22/2016, 9:33 AM

## 2016-08-22 NOTE — Progress Notes (Signed)
Pt ready for discharge. Education/instructions reviewed with pt and spouse, and all questions/concerns addressed. IV removed and belongings gathered. Pt will be transported out via wheelchair to wife's vehicle. Will continue to monitor

## 2016-08-22 NOTE — Care Management Note (Addendum)
Case Management Note  Patient Details  Name: Adam Barrera MRN: [id number] Date of Birth: Feb 13, 1951  Subjective/Objective:  65 yr old gentleman s/p left total shoulder arthroplasty.                  Action/Plan: Case manager spoke with patient concerning discharge plan. He was preoperatively setup with [facility], Per Dr. Marlou Sa patient will not need [facility] therapy, he will need to use shoulder CPM as ordered.   Expected Discharge Date:   08/22/16               Expected Discharge Plan:  Home/self Care In-House Referral:  NA  Discharge planning Services     Post Acute Care Choice: Choice offered to:  Patient  DME Arranged:  N/A DME Agency:  NA  HH Arranged:  [employer] Agency:  [facility] (now [facility])  Status of Service:  Completed, signed off  If discussed at [employer] of Stay Meetings, dates discussed:    Additional Comments:  Ninfa Meeker, RN 08/22/2016, 12:34 PM

## 2016-08-22 NOTE — Discharge Summary (Signed)
Physician Discharge Summary  Patient ID: Adam Barrera MRN: [id number] DOB/AGE: 11-11-1950 65 y.o.  Admit date: 08/21/2016 Discharge date: 08/22/2016  Admission Diagnoses:  Active Problems:   Shoulder arthritis   Discharge Diagnoses:  Same  Surgeries: Procedure(s): TOTAL SHOULDER ARTHROPLASTY on 08/21/2016   Consultants:   Discharged Condition: Stable  Hospital Course: Adam Barrera is an 65 y.o. male who was admitted 08/21/2016 with a chief complaint of Left shoulder pain, and found to have a diagnosis of left shoulder arthritis.  They were brought to the operating room on 08/21/2016 and underwent the above named procedures.  He did well with occupational therapy on postop day #1.  Pain was controlled on postop day #1.  He is discharged home in good condition and will use the CPM machine for the first 2 weeks and then start physical therapy afterwards  Antibiotics given:  Anti-infectives    Start     Dose/Rate Route Frequency Ordered Stop   08/21/16 1700  ceFAZolin (ANCEF) IVPB 2g/100 mL premix     2 g 200 mL/hr over 30 Minutes Intravenous Every 6 hours 08/21/16 1442 08/22/16 0056   08/21/16 1053  vancomycin (VANCOCIN) powder  Status:  Discontinued       As needed 08/21/16 1053 08/21/16 1218   08/21/16 0700  ceFAZolin (ANCEF) 3 g in dextrose 5 % 50 mL IVPB     3 g 130 mL/hr over 30 Minutes Intravenous To [facility]Stay [facility] 08/18/16 0749 08/21/16 1155    .  Recent vital signs:  Vitals:   08/22/16 0422 08/22/16 0900  BP: (!) 98/54 102/64  Pulse: 97 (!) 102  Resp: 16   Temp: 99.5 F (37.5 C)     Recent laboratory studies:  Results for orders placed or performed during the hospital encounter of 08/21/16  Glucose, capillary  Result Value Ref Range   Glucose-Capillary 133 (H) 65 - 99 mg/dL  Glucose, capillary  Result Value Ref Range   Glucose-Capillary 119 (H) 65 - 99 mg/dL  Glucose, capillary  Result Value Ref Range   Glucose-Capillary 109 (H) 65 - 99 mg/dL    Comment 1 Notify RN     Discharge Medications:     Medication List    TAKE these medications   CINNAMON PO Take 1,000 mg by mouth daily.   Fish Oil 1200 MG Caps Take 1,200 mg by mouth 2 (two) times daily.   Flaxseed Oil 1200 MG Caps Take 1,200 mg by mouth daily.   Ginger Root 550 MG Caps Take 550 mg by mouth daily.   glucose blood test strip One Touch verio test strips. Use to check BG once a day. Dx: type 2 DM E11.9   losartan 100 MG tablet Commonly known as:  COZAAR TAKE 1 TABLET (100 MG TOTAL) BY MOUTH AT BEDTIME. What changed:  when to take this   Magnesium 250 MG Tabs Take 250 mg by mouth daily.   meloxicam 7.5 MG tablet Commonly known as:  MOBIC TAKE 2 TABLETS (15 MG TOTAL) BY MOUTH DAILY. What changed:  See the new instructions.   metFORMIN 500 MG tablet Commonly known as:  GLUCOPHAGE Take 1 tablet (500 mg total) by mouth 2 (two) times daily with a meal.   methocarbamol 500 MG tablet Commonly known as:  ROBAXIN Take 1 tablet (500 mg total) by mouth daily as needed for muscle spasms.   niacin 500 MG CR tablet Commonly known as:  NIASPAN Take 500 mg by mouth at bedtime.  ONETOUCH DELICA LANCETS 99991111 Misc Use to check BG once a day.  Dx: type 2 DM E11.9   oxybutynin 5 MG tablet Commonly known as:  DITROPAN TAKE 1 TABLET (5 MG TOTAL) BY MOUTH 2 (TWO) TIMES DAILY.   oxyCODONE 5 MG immediate release tablet Commonly known as:  Oxy IR/ROXICODONE Take 1-2 tablets (5-10 mg total) by mouth every 4 (four) hours as needed for breakthrough pain.   pravastatin 40 MG tablet Commonly known as:  PRAVACHOL TAKE 1 TABLET (40 MG TOTAL) BY MOUTH AT BEDTIME.   Turmeric 500 MG Tabs Take 500 mg by mouth 2 (two) times daily.   varenicline 0.5 MG X 11 & 1 MG X 42 tablet Commonly known as:  CHANTIX STARTING MONTH PAK Take one 0.5 mg tab po qd for 3 days, then increase to one 0.5 mg tab bid  for 4 days, then increase to one 1 mg tab bid What changed:  how much to  take  how to take this  when to take this  additional instructions       Diagnostic Studies: Dg Chest 2 View  Result Date: 08/14/2016 CLINICAL DATA:  Preoperative exam prior left shoulder surgery. Current smoker. History of diabetes and hypertension. EXAM: CHEST  2 VIEW COMPARISON:  Chest x-ray of January 07, 2013 FINDINGS: The lungs are adequately inflated. The interstitial markings are coarse. There is no alveolar infiltrate. There is no pleural effusion. The heart and pulmonary vascularity are normal. The mediastinum is normal in width. There is mild multilevel degenerative disc disease of the thoracic spine. IMPRESSION: Chronic bronchitic/smoking related changes. No pneumonia, CHF, nor other acute cardiopulmonary disease. Electronically Signed   By: David  Martinique M.D.   On: 08/14/2016 15:08   Dg Shoulder Left Port  Result Date: 08/21/2016 CLINICAL DATA:  Shoulder arthritis; post left shoulder replacement. Best obtainable [facility]. EXAM: LEFT SHOULDER - 1 VIEW COMPARISON:  06/13/2016 FINDINGS: Interval placement of left shoulder arthroplasty. Components appear well seated. No evidence of acute fracture or dislocation. No focal bone lesion or bone destruction. Degenerative changes in the acromioclavicular joint. Soft tissues are unremarkable. IMPRESSION: Left shoulder arthroplasty appears well seated. No acute bony abnormalities. Electronically Signed   By: Lucienne Capers M.D.   On: 08/21/2016 21:41    Disposition: 01-Home or Self Care  Discharge Instructions    Call MD / Call 911    Complete by:  As directed    If you experience chest pain or shortness of breath, CALL 911 and be transported to the hospital emergency room.  If you develope a fever above 101 F, pus (white drainage) or increased drainage or redness at the wound, or calf pain, call your surgeon's office.   Constipation Prevention    Complete by:  As directed    Drink plenty of fluids.  Prune juice may be helpful.  You may  use a stool softener, such as Colace (over the counter) 100 mg twice a day.  Use MiraLax (over the counter) for constipation as needed.   Diet - low sodium heart healthy    Complete by:  As directed    Discharge instructions    Complete by:  As directed    Okay to shower.  Dressing is waterproof Remove dressing next Monday. Use CPM machine 1 hour 3 times a day. No lifting with left arm and okay to use sling for comfort Increase the degrees in the CPM machine daily but not to exceed 90 of abduction and 30 of  external rotation and 90 of forward flexion  - please share this information with the CPM vendor Start the CPM machine at 30 of abduction and and 10 of external rotation and try to increase degrees daily as possible We will start physical therapy after your first postop visit.  The CPM machine use will be very good for motion for the first 2 weeks   Increase activity slowly as tolerated    Complete by:  As directed          Signed: Anderson Malta 08/22/2016, 12:58 PM

## 2016-08-22 NOTE — Care Management Obs Status (Deleted)
[place] NOTIFICATION   Patient Details  Name: Adam Barrera MRN: [id number] Date of Birth: [birth year]   Medicare Observation Status Notification Given:       Ninfa Meeker, RN 08/22/2016, 12:58 PM

## 2016-08-22 NOTE — Op Note (Signed)
NAME:  Adam Barrera, Adam Barrera NO.:  [id number]  MEDICAL RECORD NO.:  [id number]  LOCATION:  PERIO                        FACILITY:  [facility]  PHYSICIAN:  Anderson Malta, M.D.    DATE OF BIRTH:  1951-03-15  DATE OF PROCEDURE: DATE OF DISCHARGE:                              OPERATIVE REPORT   PREOPERATIVE DIAGNOSIS:  Left shoulder arthritis.  POSTOPERATIVE DIAGNOSIS:  Left shoulder arthritis.  PROCEDURE:  Left total shoulder replacement, Biomet medium glenoid 12 press-fit mini-stem with 21-mm in height head.  SURGEON:  Anderson Malta, M.D.  ASSISTANT:  Laure Kidney, RNFA.  INDICATIONS:  Adam Barrera is a patient with end-stage left shoulder arthritis, presents for operative management after explanation of risks and benefits.  PROCEDURE IN DETAIL:  The patient was brought to the operating room where general endotracheal anesthesia was induced.  Preoperative antibiotics were administered.  Time-out was called.  The patient was placed in the beach-chair position with the head in neutral position. Left shoulder, arm and axilla were prescrubbed with alcohol and Betadine, allowed to air dry, prepped with DuraPrep solution and draped in a sterile manner.  Adam Barrera was used to cover the entire operative field.  Incision was made just lateral to the coracoid process at the inferior aspect of the clavicle extending distally about 2 cm below the axillary crease.  The patient had increased body mass index and this did add to the complexity of the case.  Skin and subcutaneous tissues were sharply divided.  Cephalic vein was mobilized medially.  Deltopectoral interval was developed.  Kolbel and Browne retractors were placed.  At this time, the biceps tendon was tenodesed to the pec tendon, which was partially released about 1 cm.  The rotator interval was then opened. The anterior humeral circumflex vessels were then ligated with silk ligature.  The axillary nerve was then palpated and  visualized.  Vessel loop was placed around it and it was protected at all times during the remaining portion of the case.  The subscap was then taken down about 1 cm from its attachment on the lesser tuberosity.  Tagged with Adam Barrera sutures x2 at its superior-inferior aspect.  This was tagged on both sides of the subscap tenotomy.  At this time, the capsule was then released around to the 7 o'clock position on the humeral head.  Also, released about 2 cm inferior to the neck margin with the axillary nerve protected.  No muscle relaxation was utilized during the case in order to allow for added protection of the nerve.  At this time, the anterior capsule was excised.  The head was then dislocated, cuff was protected. The humeral head was then opened up and it was then reamed up to about 12 mm.  The neck cut was made in about 30 degrees of retroversion. Again, the nerve and cuff attachment were protected.  Broaching was then performed up to an appropriate size, which gave excellent press-fit. Cap was then applied.  At this time, Adam Barrera and Adam Barrera retractors were removed.  Posterior retractor was placed.  Anterior retractor was placed.  The labrum was excised.  The anterior capsule was then also excised with index finger on  top of the axillary nerve.  The release was taken around to the 5 o'clock position on the glenoid.  At this time, the thorough irrigation was performed and the guidepin was placed. Medium-sized glenoid was ideal.  Guidepin was placed and the slightly preferential anterior reaming was performed as the patient had slight posterior wear.  Reaming was then performed.  This gave good bleeding bone circumferentially.  The middle hole was then drilled along with three peripheral PEG holes.  The middle hole stated within the vault. The trial glenoid was placed and this had excellent fit.  Trial glenoid released, thorough irrigation performed, true glenoid placed, which  had biologic ingrowth on the central peg.  This gave excellent fixation.  At this time, attention was then redirected towards reducing the head with the 21-mm humeral head on the stem.  The 21-mm head gave excellent stability, gave about 50% posterior translation, 50% inferior translation.  The patient had internal rotation parallel to the torso and could reach his hand to his head.  This was deemed to be very good shoulder stability with good motion as well.  Trial components were removed from the humerus, thorough irrigation performed, true components placed with same stability parameters maintained.  Fiber tape was utilized through two drill holes to supplement the subscap repair. Vancomycin powder placed on both intra-articular as well as extra- articular.  The subscap repair was then performed in neutral rotation. This was done with the two Adam Barrera tagging sutures placed at superior leaflet and anterior leaflets.  Also supplemented by the two fiber tapes placed through the lesser tuberosity and this was then supplemented using four Adam Barrera sutures placed to the stability.  At the closure, the patient had about 30-35 degrees of external rotation.  Thorough irrigation again performed, deltopectoral interval was closed using #1 Vicryl suture followed by interrupted inverted 0 Vicryl suture and 3-0 Monocryl.  Aquacel dressing placed.  The patient tolerated the procedure well without immediate complication.  He was placed in the shoulder immobilizer.     Anderson Malta, M.D.     GSD/MEDQ  D:  08/21/2016  T:  08/22/2016  Job:  [id number]

## 2016-08-22 NOTE — Progress Notes (Signed)
Subjective: Patient stable pain controlled   Objective: Vital signs in last 24 hours: Temp:  [97.5 F (36.4 C)-99.5 F (37.5 C)] 99.5 F (37.5 C) (11/28 0422) Pulse Rate:  [92-109] 102 (11/28 0900) Resp:  [16-26] 16 (11/28 0422) BP: (79-118)/(54-83) 102/64 (11/28 0900) SpO2:  [89 %-100 %] 89 % (11/28 0900)  Intake/Output from previous day: 11/27 0701 - 11/28 0700 In: 2496.3 [P.O.:360; I.V.:2136.3] Out: 935 [Urine:735; Blood:200] Intake/Output this shift: No intake/output data recorded.  Exam:  Motor sensory function to the hand intact radial pulse intact on the left  Labs: No results for input(s): HGB in the last 72 hours. No results for input(s): WBC, RBC, HCT, PLT in the last 72 hours. No results for input(s): NA, K, CL, CO2, BUN, CREATININE, GLUCOSE, CALCIUM in the last 72 hours. No results for input(s): LABPT, INR in the last 72 hours.  Assessment/Plan: Plan discharge today.  We'll need CPM machine to begin tomorrow.   Landry Dyke Dean 08/22/2016, 12:52 PM

## 2016-08-22 NOTE — Care Management Obs Status (Signed)
[facility] NOTIFICATION   Patient Details  Name: Adam Barrera MRN: [id number] Date of Birth: 1950-10-29   Medicare Observation Status Notification Given:  Yes    Ninfa Meeker, RN 08/22/2016, 12:58 PM

## 2016-09-04 ENCOUNTER — Encounter (INDEPENDENT_AMBULATORY_CARE_PROVIDER_SITE_OTHER): Payer: Self-pay | Admitting: Orthopedic Surgery

## 2016-09-04 ENCOUNTER — Ambulatory Visit (INDEPENDENT_AMBULATORY_CARE_PROVIDER_SITE_OTHER): Payer: Medicare Other

## 2016-09-04 ENCOUNTER — Ambulatory Visit (INDEPENDENT_AMBULATORY_CARE_PROVIDER_SITE_OTHER): Payer: Medicare Other | Admitting: Orthopedic Surgery

## 2016-09-04 DIAGNOSIS — M25512 Pain in left shoulder: Secondary | ICD-10-CM | POA: Diagnosis not present
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
## 2016-09-12 ENCOUNTER — Ambulatory Visit: Payer: Medicare Other | Attending: Orthopedic Surgery | Admitting: Physical Therapy

## 2016-09-12 DIAGNOSIS — M6281 Muscle weakness (generalized): Secondary | ICD-10-CM | POA: Diagnosis not present

## 2016-09-12 DIAGNOSIS — M25612 Stiffness of left shoulder, not elsewhere classified: Secondary | ICD-10-CM | POA: Insufficient documentation
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
## 2016-09-19 ENCOUNTER — Other Ambulatory Visit (INDEPENDENT_AMBULATORY_CARE_PROVIDER_SITE_OTHER): Payer: Self-pay | Admitting: Orthopedic Surgery

## 2016-09-21 ENCOUNTER — Ambulatory Visit: Payer: Medicare Other | Admitting: *Deleted

## 2016-09-21 DIAGNOSIS — M6281 Muscle weakness (generalized): Secondary | ICD-10-CM | POA: Diagnosis not present

## 2016-09-21 DIAGNOSIS — M25612 Stiffness of left shoulder, not elsewhere classified: Secondary | ICD-10-CM | POA: Diagnosis not present
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
## 2016-09-22 ENCOUNTER — Other Ambulatory Visit: Payer: Self-pay | Admitting: Family

## 2016-09-28 ENCOUNTER — Ambulatory Visit: Payer: Medicare Other | Attending: Orthopedic Surgery | Admitting: *Deleted

## 2016-09-28 DIAGNOSIS — M25612 Stiffness of left shoulder, not elsewhere classified: Secondary | ICD-10-CM | POA: Diagnosis not present

## 2016-09-28 DIAGNOSIS — M6281 Muscle weakness (generalized): Secondary | ICD-10-CM | POA: Insufficient documentation
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
## 2016-10-04 ENCOUNTER — Ambulatory Visit (INDEPENDENT_AMBULATORY_CARE_PROVIDER_SITE_OTHER): Payer: Medicare Other | Admitting: Orthopedic Surgery

## 2016-10-04 ENCOUNTER — Encounter (INDEPENDENT_AMBULATORY_CARE_PROVIDER_SITE_OTHER): Payer: Self-pay | Admitting: Orthopedic Surgery

## 2016-10-04 DIAGNOSIS — Z96612 Presence of left artificial shoulder joint: Secondary | ICD-10-CM
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
## 2016-10-04 NOTE — Progress Notes (Signed)
6 

## 2016-10-05 ENCOUNTER — Ambulatory Visit: Payer: Medicare Other | Admitting: *Deleted

## 2016-10-05 ENCOUNTER — Other Ambulatory Visit: Payer: Self-pay | Admitting: Family

## 2016-10-05 DIAGNOSIS — M6281 Muscle weakness (generalized): Secondary | ICD-10-CM

## 2016-10-05 DIAGNOSIS — M25612 Stiffness of left shoulder, not elsewhere classified: Secondary | ICD-10-CM | POA: Diagnosis not present
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
## 2016-10-05 NOTE — Patient Instructions (Signed)
  Strengthening: Resisted Flexion   Hold tubing with left arm at side. Pull forward and up. Move shoulder through pain-free range of motion. Repeat __10__ times per set. Do _2-3___ sets per session. Do _2-3___ sessions per day. http://orth.exer.us/824   Copyright  VHI. All rights reserved.  Strengthening: Resisted Extension   Hold tubing in right hand, arm forward. Pull arm back, elbow straight. Repeat __10__ times per set. Do _2-3___ sets per session. Do _2-3___ sessions per day.  http://orth.exer.us/832   Copyright  VHI. All rights reserved.  Strengthening: Resisted Internal Rotation   Hold tubing in left hand, elbow at side and forearm out. Rotate forearm in across body. Repeat __10__ times per set. Do _2-3___ sets per session. Do _2-3___ sessions per day.  http://orth.exer.us/830   Copyright  VHI. All rights reserved.  Strengthening: Resisted External Rotation   Hold tubing in right hand, elbow at side and forearm across body. Rotate forearm out. Repeat __10__ times per set. Do __2-3__ sets per session. Do ____ sessions per day.  http://orth.exer.us/828   Copyright  VHI. All rights reserved.    

## 2016-10-06 ENCOUNTER — Other Ambulatory Visit: Payer: Self-pay | Admitting: Family Medicine

## 2016-10-06 DIAGNOSIS — I10 Essential (primary) hypertension: Secondary | ICD-10-CM

## 2016-10-11 ENCOUNTER — Encounter: Payer: Self-pay | Admitting: Physical Therapy

## 2016-10-12 ENCOUNTER — Encounter: Payer: Self-pay | Admitting: *Deleted

## 2016-10-17 ENCOUNTER — Encounter: Payer: Self-pay | Admitting: Physical Therapy

## 2016-10-23 ENCOUNTER — Ambulatory Visit (INDEPENDENT_AMBULATORY_CARE_PROVIDER_SITE_OTHER): Payer: Medicare Other | Admitting: Family

## 2016-10-23 ENCOUNTER — Encounter: Payer: Self-pay | Admitting: Family

## 2016-10-23 VITALS — BP 121/80 | HR 78 | Temp 98.9°F | Ht 70.0 in | Wt 295.8 lb

## 2016-10-23 DIAGNOSIS — E669 Obesity, unspecified: Secondary | ICD-10-CM

## 2016-10-23 DIAGNOSIS — I10 Essential (primary) hypertension: Secondary | ICD-10-CM

## 2016-10-23 DIAGNOSIS — M1712 Unilateral primary osteoarthritis, left knee: Secondary | ICD-10-CM

## 2016-10-23 DIAGNOSIS — E8881 Metabolic syndrome: Secondary | ICD-10-CM | POA: Diagnosis not present

## 2016-10-23 DIAGNOSIS — Z6841 Body Mass Index (BMI) 40.0 and over, adult: Secondary | ICD-10-CM

## 2016-10-23 DIAGNOSIS — E1165 Type 2 diabetes mellitus with hyperglycemia: Secondary | ICD-10-CM | POA: Diagnosis not present

## 2016-10-23 DIAGNOSIS — N3281 Overactive bladder: Secondary | ICD-10-CM

## 2016-10-23 DIAGNOSIS — E785 Hyperlipidemia, unspecified: Secondary | ICD-10-CM

## 2016-10-23 DIAGNOSIS — M19019 Primary osteoarthritis, unspecified shoulder: Secondary | ICD-10-CM

## 2016-10-23 DIAGNOSIS — IMO0001 Reserved for inherently not codable concepts without codable children: Secondary | ICD-10-CM

## 2016-10-23 DIAGNOSIS — F17200 Nicotine dependence, unspecified, uncomplicated: Secondary | ICD-10-CM

## 2016-10-23 LAB — BAYER DCA HB A1C WAIVED: HB A1C: 6 % (ref <7.0)

## 2016-10-23 NOTE — Patient Instructions (Signed)

## 2016-10-23 NOTE — Progress Notes (Signed)
 Subjective:    Patient ID: Adam Barrera, male    DOB: 12/24/1950, 65 y.o.   MRN: [id number]  PT presents to the office today for chronic follow up.  Diabetes  He presents for his follow-up diabetic visit. He has type 2 diabetes mellitus. Pertinent negatives for hypoglycemia include no headaches. Pertinent negatives for diabetes include no blurred vision, no foot paresthesias, no foot ulcerations and no visual change. Symptoms are stable. Pertinent negatives for diabetic complications include no CVA. Current diabetic treatment includes oral agent (monotherapy). He is compliant with treatment all of the time. He is following a generally healthy diet. His breakfast blood glucose range is generally 110-130 mg/dl. An ACE inhibitor/angiotensin II receptor blocker is being taken. Eye exam is current (Sept 2017).  Hypertension  This is a chronic problem. The current episode started more than 1 year ago. The problem has been resolved since onset. The problem is controlled. Pertinent negatives include no anxiety, blurred vision, headaches, palpitations, peripheral edema or shortness of breath. Risk factors for coronary artery disease include dyslipidemia, obesity, male gender and smoking/tobacco exposure. Past treatments include angiotensin blockers. The current treatment provides moderate improvement. There is no history of kidney disease, CAD/MI, CVA or heart failure. There is no history of sleep apnea or a thyroid problem.  Hyperlipidemia  This is a chronic problem. The current episode started more than 1 year ago. The problem is controlled. Recent lipid tests were reviewed and are normal. Exacerbating diseases include obesity. Factors aggravating his hyperlipidemia include smoking. Pertinent negatives include no shortness of breath. Current antihyperlipidemic treatment includes statins. The current treatment provides mild improvement of lipids. Risk factors for coronary artery disease include dyslipidemia,  male sex, obesity, hypertension and a sedentary lifestyle.  Arthritis  Presents for follow-up visit. The disease course has been stable. He complains of pain. Affected locations include the left knee, left shoulder and right shoulder (back and hip). His pain is at a severity of 1/10. His pertinent risk factors include overuse. Past treatments include rest and NSAIDs. The treatment provided moderate relief.  OAB PT currently taking oxybutynin 5 mg BID. PT states this seems to help. Metabolic Syndrome Pt is overweight and is not on a low carb diet. PT currently taking pravastatin.     Review of Systems  Constitutional: Negative.   HENT: Negative.   Eyes: Negative for blurred vision.  Respiratory: Negative.  Negative for shortness of breath.   Cardiovascular: Negative.  Negative for palpitations.  Gastrointestinal: Negative.   Endocrine: Negative.   Genitourinary: Negative.   Musculoskeletal: Positive for arthritis.  Neurological: Negative.  Negative for headaches.  Hematological: Negative.   Psychiatric/Behavioral: Negative.   All other systems reviewed and are negative.      Objective:   Physical Exam  Constitutional: He is oriented to person, place, and time. He appears well-developed and well-nourished. No distress.  morbid obese   HENT:  Head: Normocephalic.  Right Ear: External ear normal.  Left Ear: External ear normal.  Nose: Nose normal.  Mouth/Throat: Oropharynx is clear and moist.  Eyes: Pupils are equal, round, and reactive to light. Right eye exhibits no discharge. Left eye exhibits no discharge.  Neck: Normal range of motion. Neck supple. No thyromegaly present.  Cardiovascular: Normal rate, regular rhythm, normal heart sounds and intact distal pulses.   No murmur heard. Pulmonary/Chest: Effort normal and breath sounds normal. No respiratory distress. He has no wheezes.  Abdominal: Soft. Bowel sounds are normal. He exhibits no distension. There is   no tenderness.    Musculoskeletal: Normal range of motion. He exhibits no edema or tenderness.  Neurological: He is alert and oriented to person, place, and time.  Skin: Skin is warm and dry. No rash noted. No erythema.  Psychiatric: He has a normal mood and affect. His behavior is normal. Judgment and thought content normal.  Vitals reviewed.   BP 121/80   Pulse 78   Temp 98.9 F (37.2 C) (Oral)   Ht 5' 10" (1.778 m)   Wt 295 lb 12.8 oz (134.2 kg)   BMI 42.44 kg/m        Assessment & Plan:  1. Essential hypertension - CMP14+EGFR  2. Type 2 diabetes mellitus with hyperglycemia, without long-term current use of insulin (HCC) - CMP14+EGFR - Bayer DCA Hb A1c Waived  3. Primary osteoarthritis of left knee - CMP14+EGFR  4. Smoker  - CMP14+EGFR  5. Hyperlipidemia, unspecified hyperlipidemia type - CMP14+EGFR - Lipid panel  6. Metabolic syndrome  - CMP14+EGFR  7. OAB (overactive bladder) - CMP14+EGFR  8. Shoulder arthritis  - CMP14+EGFR  9. Class 3 obesity with serious comorbidity and body mass index (BMI) of 40.0 to 44.9 in adult, unspecified obesity type (HCC)  - CMP14+EGFR   Continue all meds Labs pending Health Maintenance reviewed Diet and exercise encouraged RTO 6 months   Christy Hawks, FNP  

## 2016-10-24 ENCOUNTER — Ambulatory Visit: Payer: Medicare Other | Admitting: *Deleted

## 2016-10-24 DIAGNOSIS — M6281 Muscle weakness (generalized): Secondary | ICD-10-CM

## 2016-10-24 DIAGNOSIS — M25612 Stiffness of left shoulder, not elsewhere classified: Secondary | ICD-10-CM

## 2016-10-24 LAB — CMP14+EGFR
A/G RATIO: 1.3 (ref 1.2–2.2)
ALBUMIN: 3.6 g/dL (ref 3.6–4.8)
ALK PHOS: 79 IU/L (ref 39–117)
ALT: 19 IU/L (ref 0–44)
AST: 18 IU/L (ref 0–40)
BILIRUBIN TOTAL: 0.5 mg/dL (ref 0.0–1.2)
BUN / CREAT RATIO: 9 — AB (ref 10–24)
BUN: 10 mg/dL (ref 8–27)
CHLORIDE: 101 mmol/L (ref 96–106)
CO2: 26 mmol/L (ref 18–29)
Calcium: 9.5 mg/dL (ref 8.6–10.2)
Creatinine, Ser: 1.14 mg/dL (ref 0.76–1.27)
GFR calc Af Amer: 79 mL/min/{1.73_m2} (ref >59)
GFR calc non Af Amer: 68 mL/min/{1.73_m2} (ref >59)
GLOBULIN, TOTAL: 2.8 g/dL (ref 1.5–4.5)
Glucose: 119 mg/dL — ABNORMAL HIGH (ref 65–99)
Potassium: 4.6 mmol/L (ref 3.5–5.2)
SODIUM: 141 mmol/L (ref 134–144)
Total Protein: 6.4 g/dL (ref 6.0–8.5)

## 2016-10-24 LAB — LIPID PANEL
CHOLESTEROL TOTAL: 122 mg/dL (ref 100–199)
Chol/HDL Ratio: 3.6 ratio units (ref 0.0–5.0)
HDL: 34 mg/dL — ABNORMAL LOW (ref >39)
LDL Calculated: 69 mg/dL (ref 0–99)
Triglycerides: 97 mg/dL (ref 0–149)
VLDL Cholesterol Cal: 19 mg/dL (ref 5–40)
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
## 2016-10-31 ENCOUNTER — Other Ambulatory Visit: Payer: Self-pay | Admitting: Family

## 2016-10-31 DIAGNOSIS — N400 Enlarged prostate without lower urinary tract symptoms: Secondary | ICD-10-CM

## 2016-11-09 ENCOUNTER — Other Ambulatory Visit: Payer: Self-pay | Admitting: Family

## 2016-11-09 ENCOUNTER — Encounter: Payer: Self-pay | Admitting: Pediatrics

## 2016-11-09 ENCOUNTER — Ambulatory Visit (INDEPENDENT_AMBULATORY_CARE_PROVIDER_SITE_OTHER): Payer: Medicare Other | Admitting: Pediatrics

## 2016-11-09 VITALS — BP 108/63 | HR 76 | Temp 98.7°F | Ht 70.0 in | Wt 302.0 lb

## 2016-11-09 DIAGNOSIS — J069 Acute upper respiratory infection, unspecified: Secondary | ICD-10-CM | POA: Diagnosis not present

## 2016-11-09 DIAGNOSIS — J440 Chronic obstructive pulmonary disease with acute lower respiratory infection: Secondary | ICD-10-CM | POA: Diagnosis not present

## 2016-11-09 DIAGNOSIS — R062 Wheezing: Secondary | ICD-10-CM | POA: Diagnosis not present

## 2016-11-09 DIAGNOSIS — J209 Acute bronchitis, unspecified: Secondary | ICD-10-CM

## 2016-11-09 DIAGNOSIS — E785 Hyperlipidemia, unspecified: Secondary | ICD-10-CM

## 2016-11-09 MED ORDER — ALBUTEROL SULFATE HFA 108 (90 BASE) MCG/ACT IN AERS: 2.0000 | INHALATION_SPRAY | Freq: Four times a day (QID) | RESPIRATORY_TRACT | 0 refills | Status: DC | PRN

## 2016-11-09 MED ORDER — AZITHROMYCIN 250 MG PO TABS: ORAL_TABLET | ORAL | 0 refills | Status: DC

## 2016-11-09 MED ORDER — SPACER/AERO CHAMBER MOUTHPIECE MISC: 1.0000 | Freq: Four times a day (QID) | 0 refills | Status: AC | PRN

## 2016-11-09 NOTE — Progress Notes (Signed)
  Subjective:   Patient ID: Adam Barrera, male    DOB: [birth year], 66 y.o.   MRN: [id number] CC: Cough (pt here today c/o cough that won't go away)  HPI: Adam Barrera is a 66 y.o. male presenting for Cough (pt here today c/o cough that won't go away)  Coughing for the past week 3 weeks ago had GI illness that lasted a few days Has been feeling fine Cough bothering mostly at night Taking nyquil regularly Didn't take any today Coughing up mucus No cough when well Quit smoking 06/2016 Sinuses have been draining  Relevant past medical, surgical, family and social history reviewed. Allergies and medications reviewed and updated. History  Smoking Status  . Former Smoker  . Packs/day: 1.00  . Years: 30.00  . Types: Cigarettes  Smokeless Tobacco  . Never Used   ROS: Per HPI   Objective:    BP 108/63   Pulse 76   Temp 98.7 F (37.1 C) (Oral)   Ht 5\' 10"  (1.778 m)   Wt (!) 302 lb (137 kg)   BMI 43.33 kg/m   Wt Readings from Last 3 Encounters:  11/09/16 (!) 302 lb (137 kg)  10/23/16 295 lb 12.8 oz (134.2 kg)  08/21/16 294 lb 11 oz (133.7 kg)    Gen: NAD, alert, cooperative with exam, NCAT EYES: EOMI, no conjunctival injection, or no icterus ENT:  TMs dull gray b/l, OP without erythema LYMPH: no cervical LAD CV: NRRR, normal S1/S2, no murmur, distal pulses 2+ b/l Resp: prolonged exp phase, slight wheeze bases bilaterally, normal WOB Abd: +BS, soft, NTND.  Ext: No edema, warm Neuro: Alert and oriented  Assessment & Plan:  Adam Barrera was seen today for cough, URI symptoms.  Diagnoses and all orders for this visit:  Wheezing Will treat with azithromycin, albuterol TID while sick If needing albuterol after illness resolved RTC -     azithromycin (ZITHROMAX) 250 MG tablet; Take 2 the first day and then one each day after. -     Spacer/Aero Chamber Mouthpiece MISC; 1 each by Does not apply route every 6 (six) hours as needed. -     albuterol (PROVENTIL HFA;VENTOLIN HFA)  108 (90 Base) MCG/ACT inhaler; Inhale 2 puffs into the lungs every 6 (six) hours as needed for wheezing or shortness of breath.  Acute URI Discussed symptom care -     Spacer/Aero Chamber Mouthpiece MISC; 1 each by Does not apply route every 6 (six) hours as needed. -     albuterol (PROVENTIL HFA;VENTOLIN HFA) 108 (90 Base) MCG/ACT inhaler; Inhale 2 puffs into the lungs every 6 (six) hours as needed for wheezing or shortness of breath.  Acute bronchitis with COPD ([place]) -     azithromycin (ZITHROMAX) 250 MG tablet; Take 2 the first day and then one each day after.   Follow up plan: As scheduled Assunta Found, MD [place]

## 2016-11-09 NOTE — Patient Instructions (Addendum)
Three times a day albuterol while sick, coughing and wheezing  Netipot with distilled water 2-3 times a day to clear out sinuses Or Normal saline nasal spray Flonase steroid nasal spray Antihistamine daily such as cetirizine Tylenol as needed Lots of fluids

## 2016-11-15 ENCOUNTER — Ambulatory Visit (INDEPENDENT_AMBULATORY_CARE_PROVIDER_SITE_OTHER): Payer: Medicare Other | Admitting: Orthopedic Surgery

## 2016-11-15 ENCOUNTER — Encounter (INDEPENDENT_AMBULATORY_CARE_PROVIDER_SITE_OTHER): Payer: Self-pay | Admitting: Orthopedic Surgery

## 2016-11-15 DIAGNOSIS — Z96612 Presence of left artificial shoulder joint: Secondary | ICD-10-CM
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
## 2016-12-24 ENCOUNTER — Other Ambulatory Visit: Payer: Self-pay | Admitting: Family Medicine

## 2017-01-01 ENCOUNTER — Other Ambulatory Visit: Payer: Self-pay | Admitting: Family Medicine

## 2017-01-08 ENCOUNTER — Other Ambulatory Visit: Payer: Self-pay | Admitting: Family

## 2017-01-09 ENCOUNTER — Other Ambulatory Visit: Payer: Self-pay | Admitting: Family

## 2017-01-09 DIAGNOSIS — I10 Essential (primary) hypertension: Secondary | ICD-10-CM

## 2017-01-19 IMAGING — CR DG CHEST 2V
2 series · 2 of 2 positions shown · non-contrast
Comparison: Chest x-ray of January 07, 2013

CLINICAL DATA: Preoperative exam prior left shoulder surgery.
Current smoker. History of diabetes and hypertension.

EXAM:
CHEST  2 VIEW

[w chest pa]
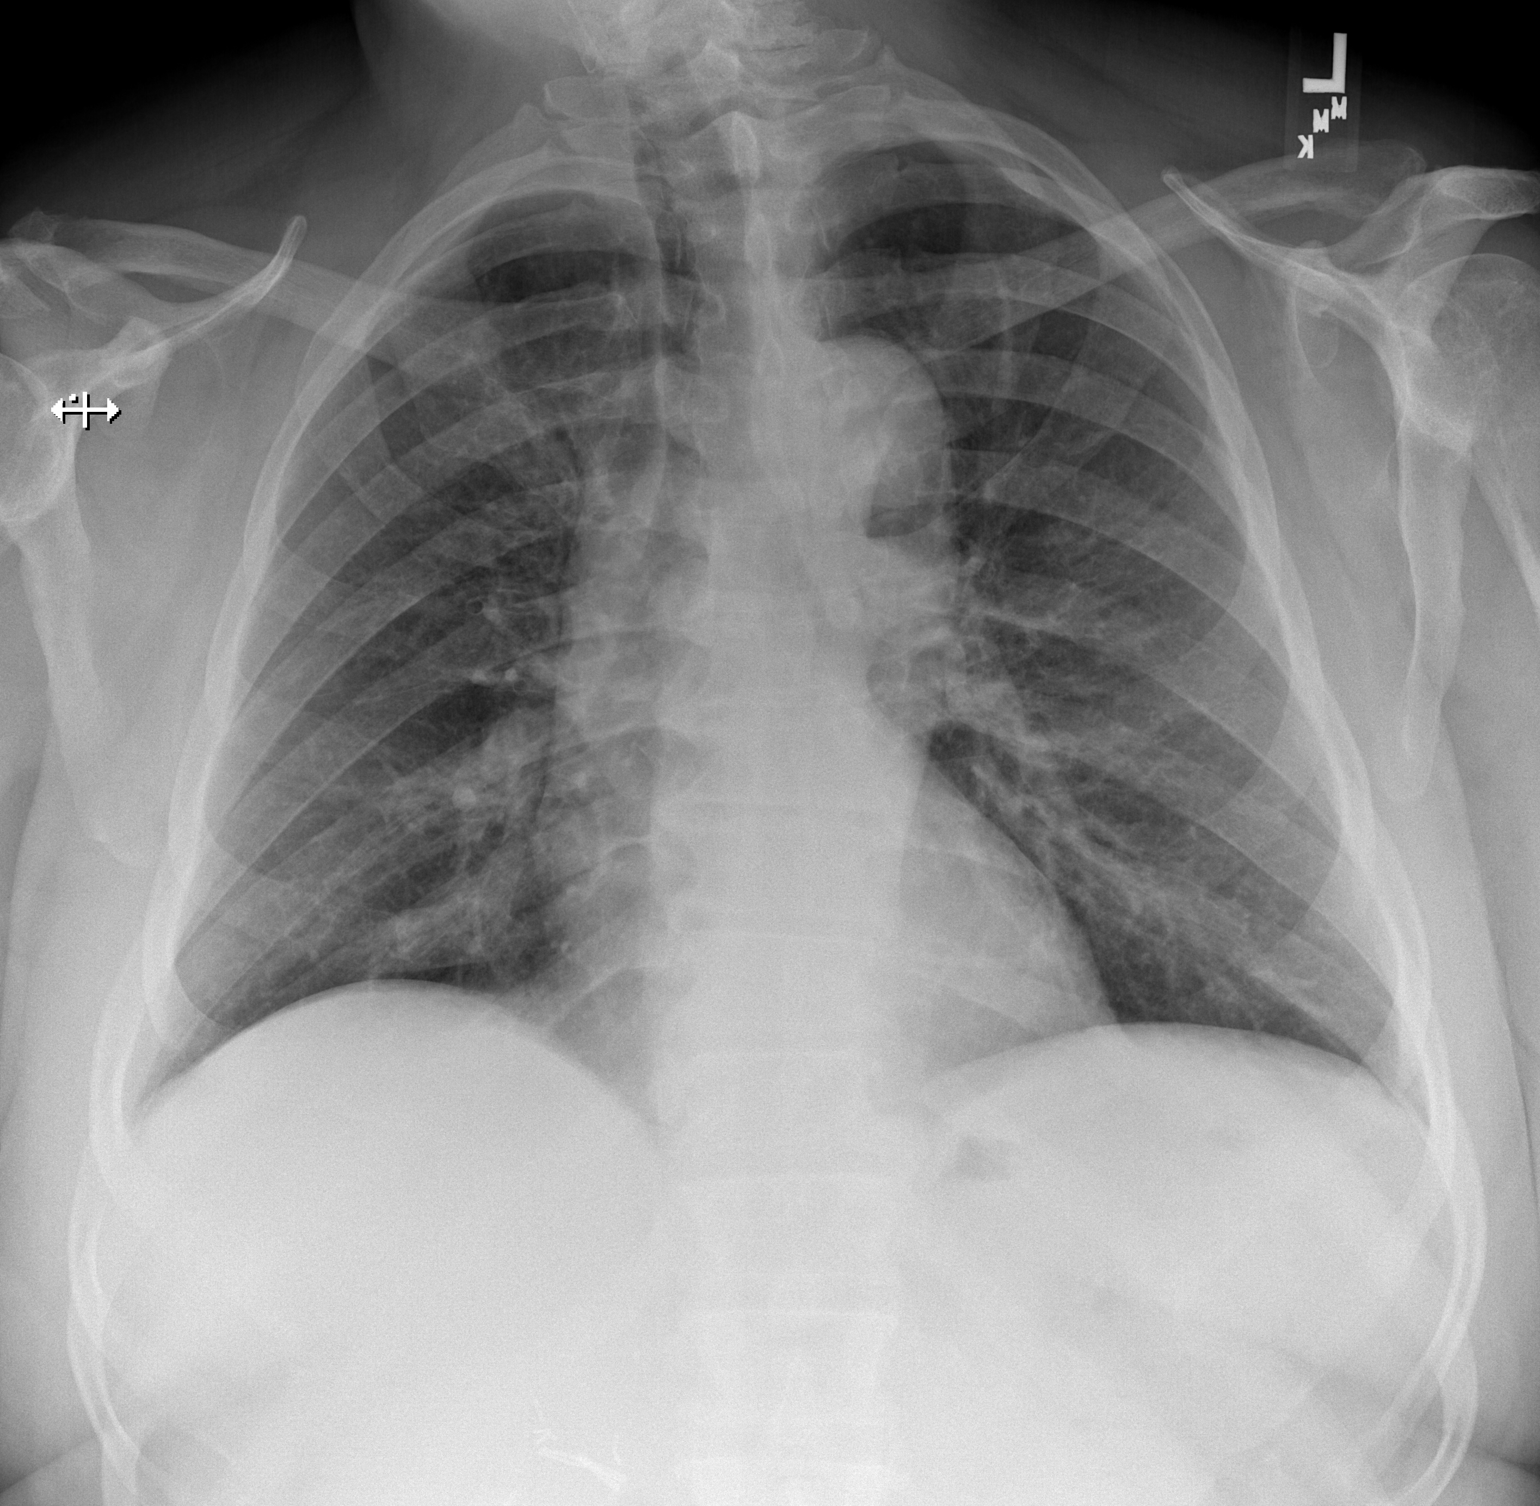

[w chest lat]
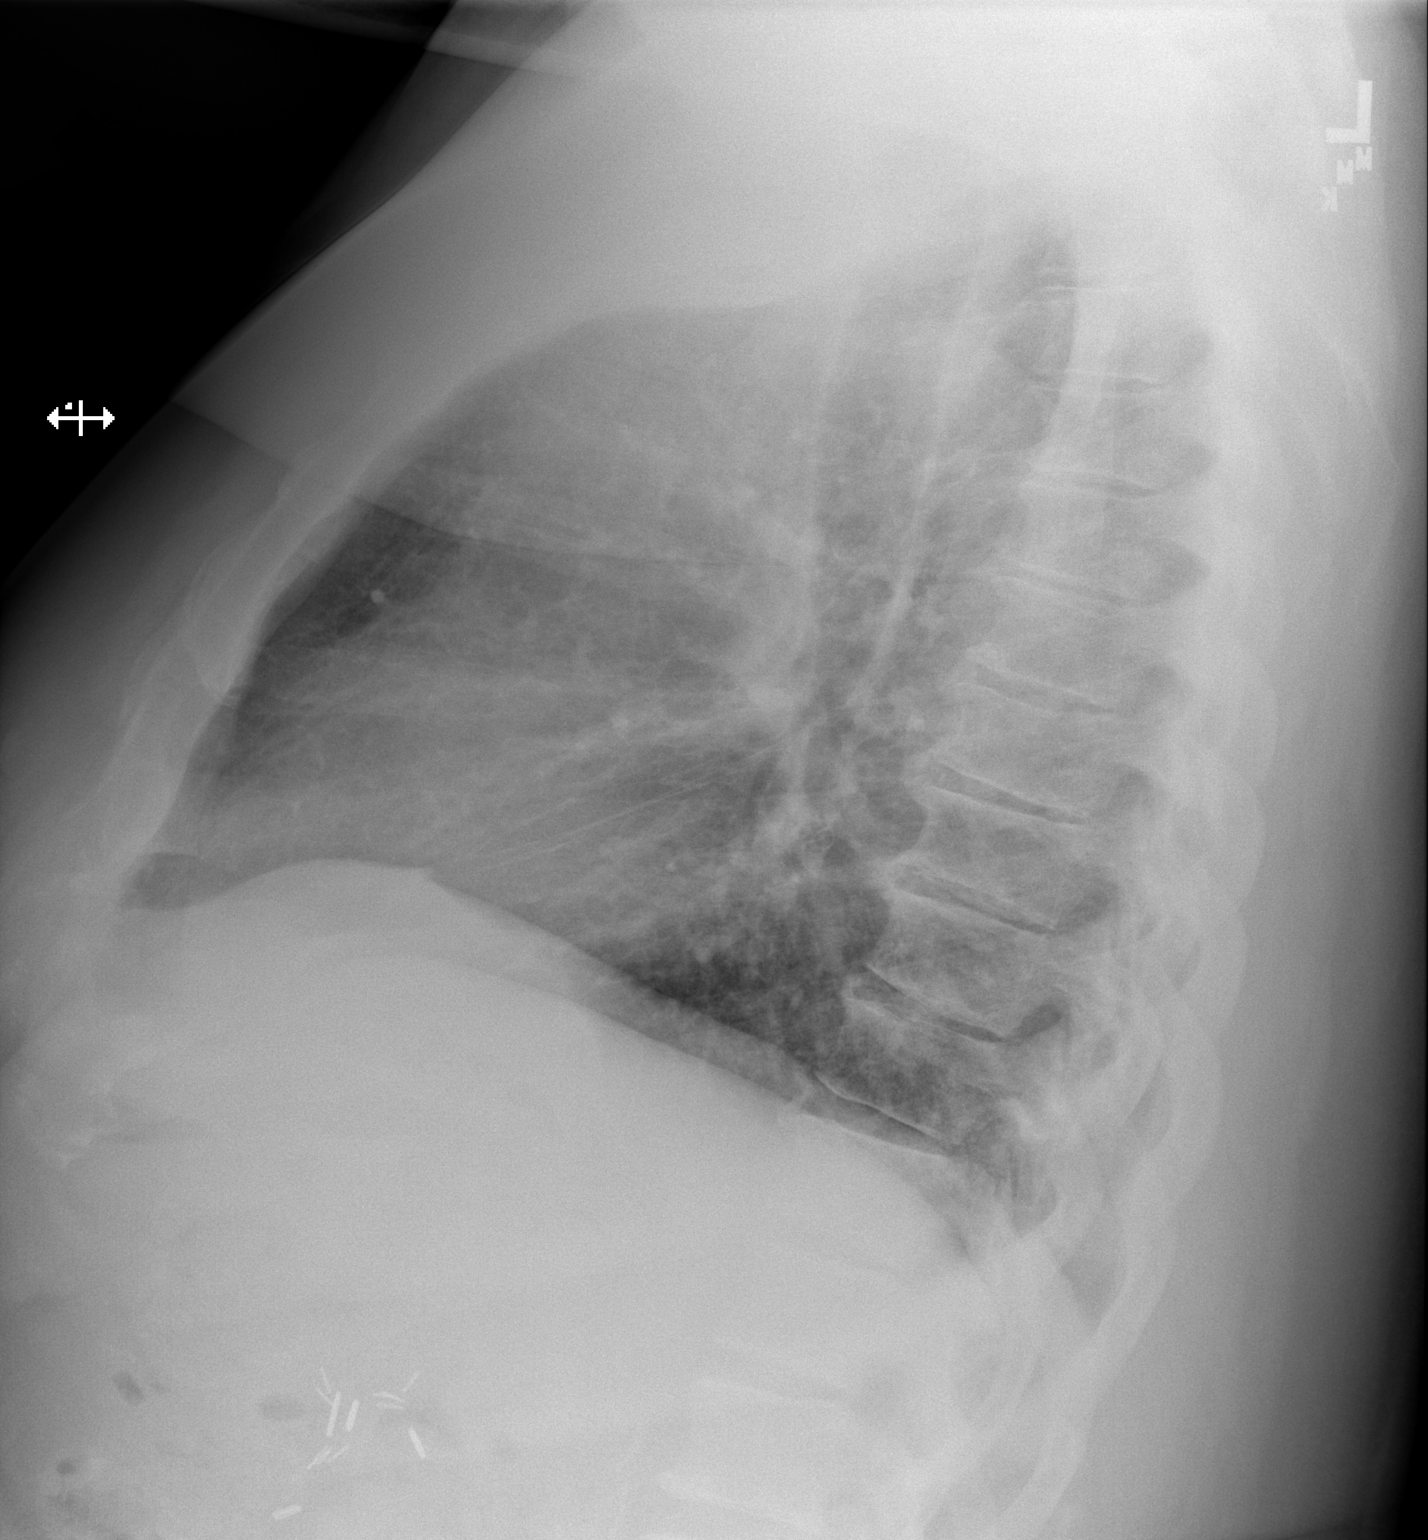

[2 of 2 positions shown; findings below may reference images not displayed]

FINDINGS: The lungs are adequately inflated. The interstitial markings are
coarse. There is no alveolar infiltrate. There is no pleural
effusion. The heart and pulmonary vascularity are normal. The
mediastinum is normal in width. There is mild multilevel
degenerative disc disease of the thoracic spine.
IMPRESSION: Chronic bronchitic/smoking related changes. No pneumonia, CHF, nor
other acute cardiopulmonary disease.

## 2017-01-26 IMAGING — CR DG SHOULDER 1V*L*
2 series · 2 of 2 positions shown · non-contrast
Comparison: 06/13/2016

CLINICAL DATA: Shoulder arthritis; post left shoulder replacement.
Best obtainable Y-view.

EXAM:
LEFT SHOULDER - 1 VIEW

[AP (1 of 2)]
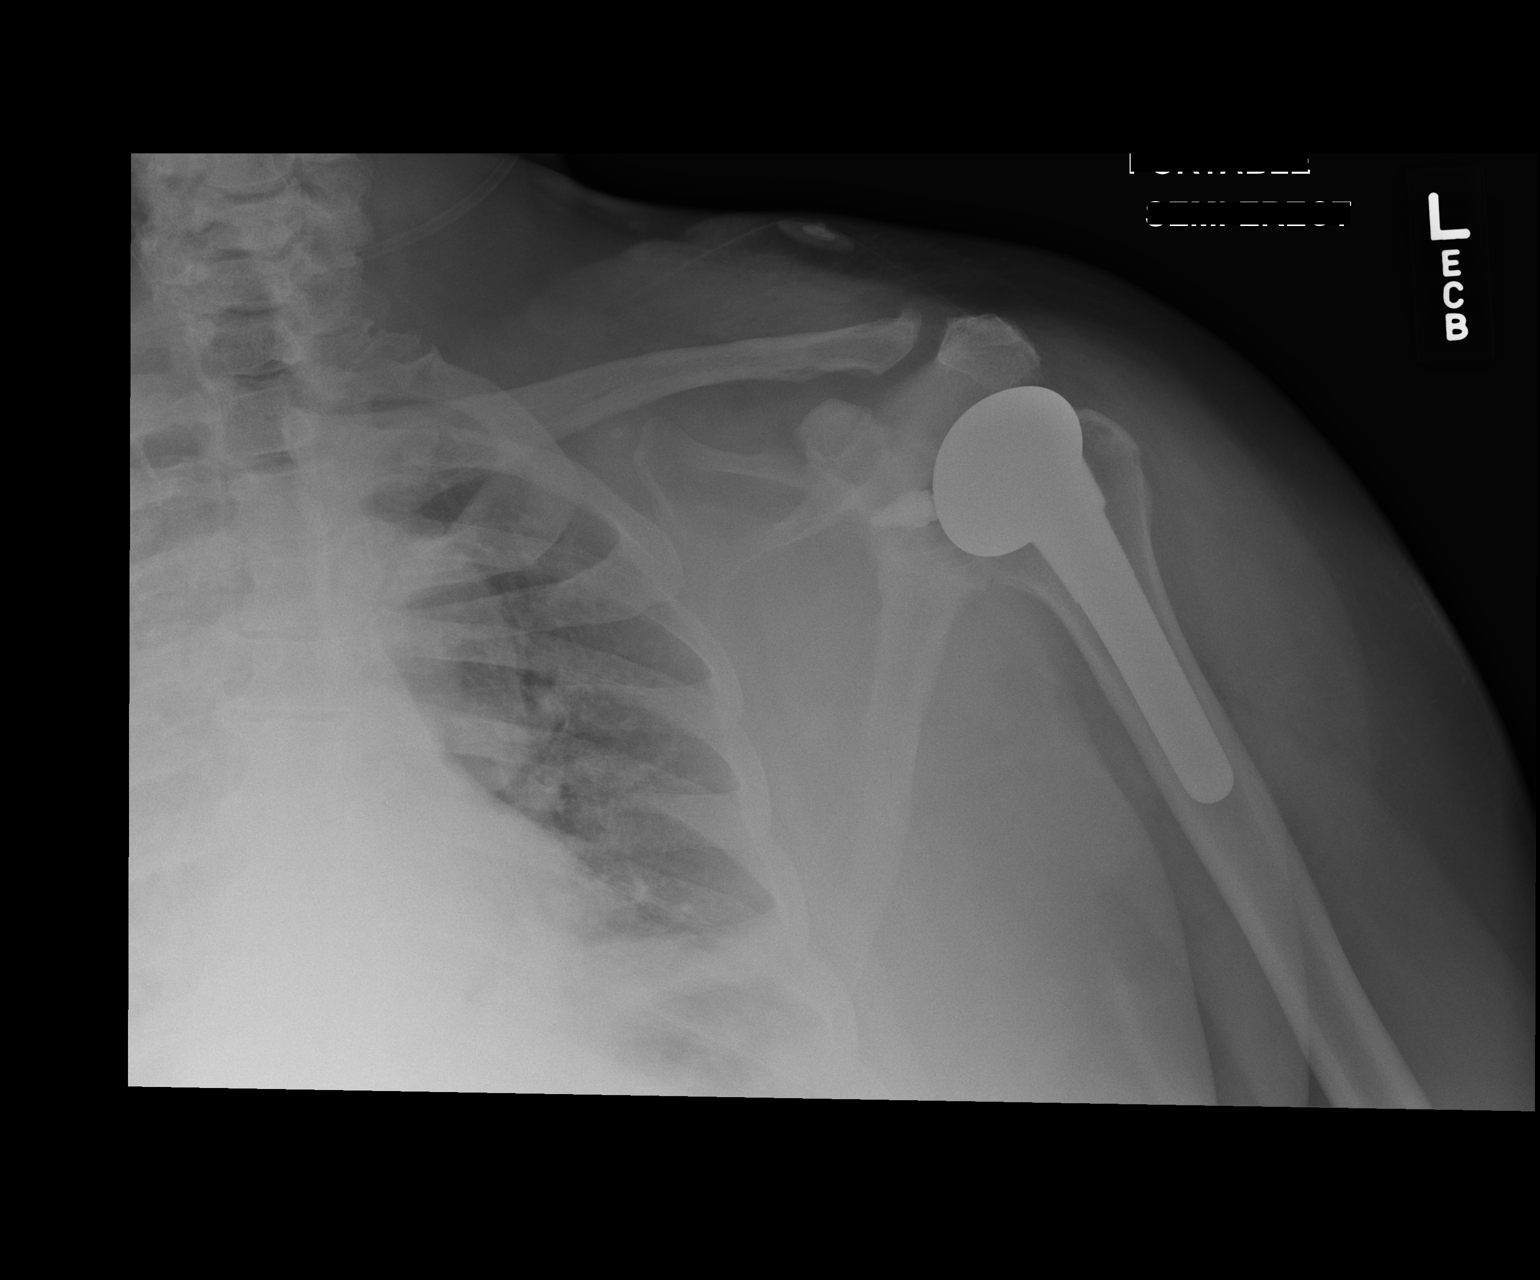

[AP (2 of 2)]
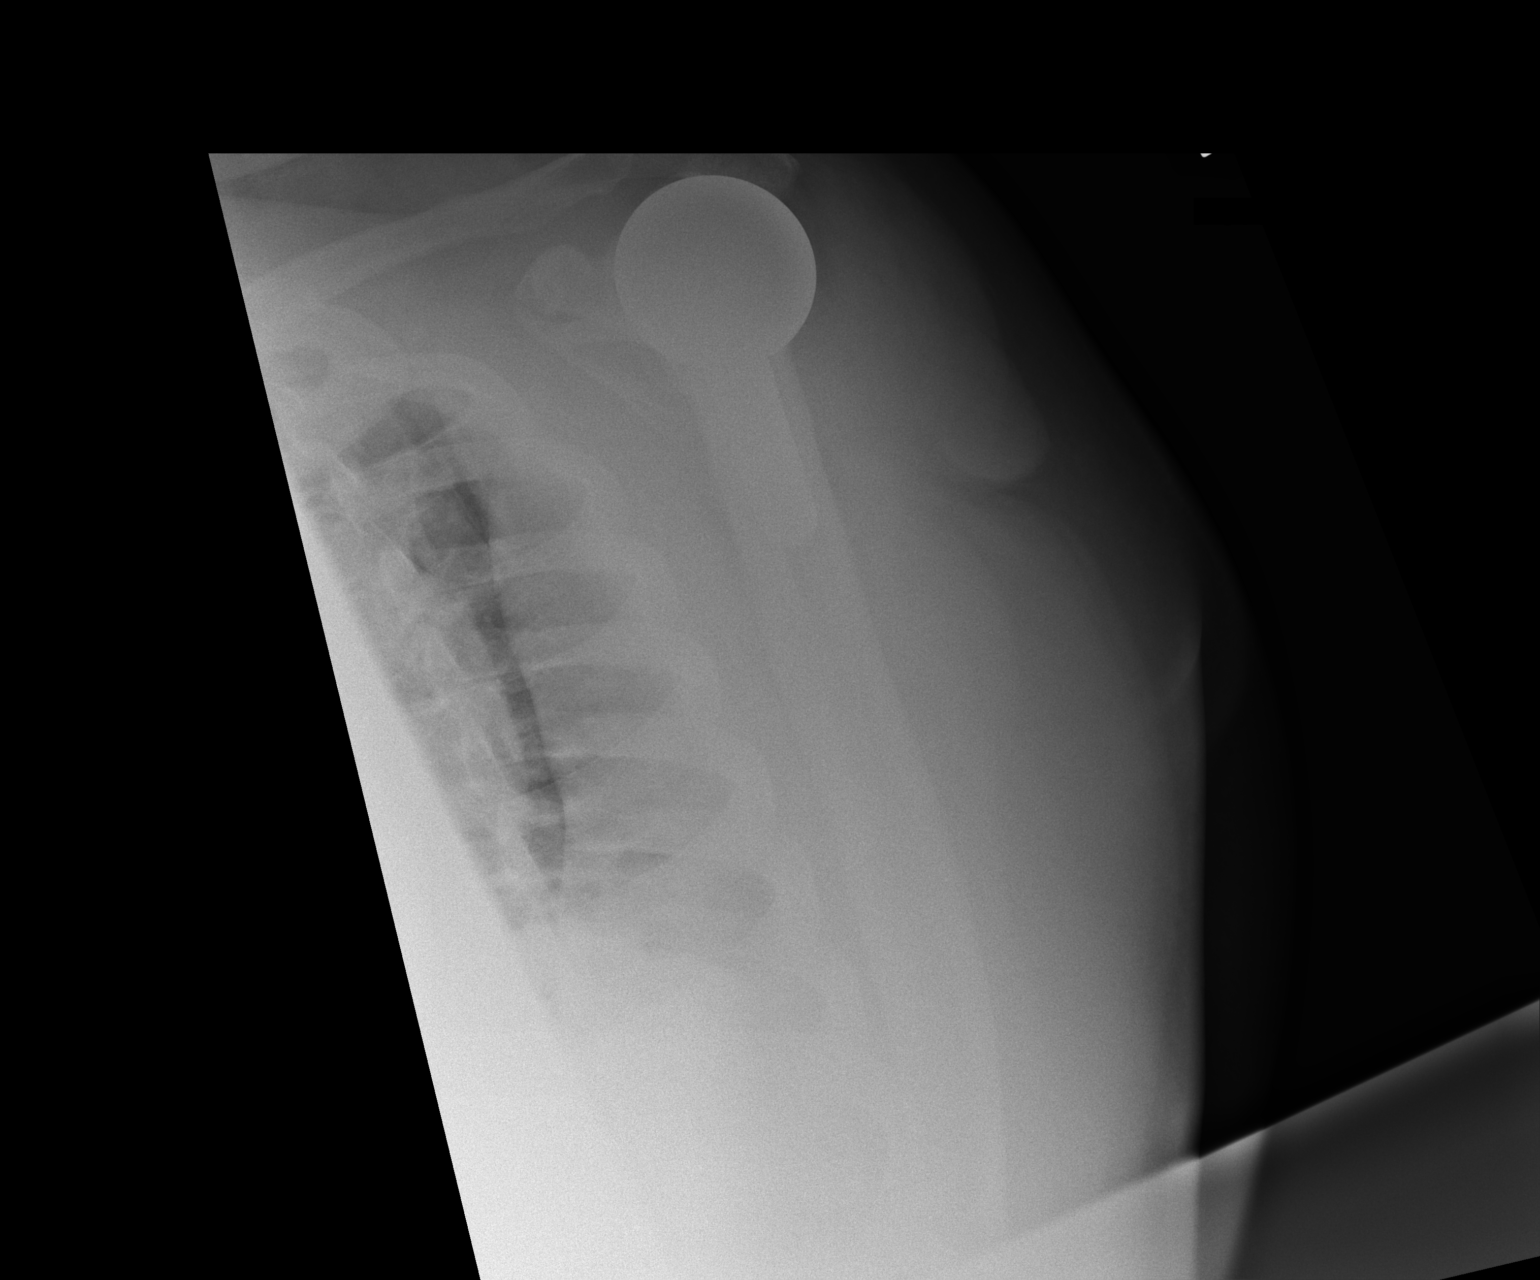

[2 of 2 positions shown; findings below may reference images not displayed]

FINDINGS: Interval placement of left shoulder arthroplasty. Components appear
well seated. No evidence of acute fracture or dislocation. No focal
bone lesion or bone destruction. Degenerative changes in the
acromioclavicular joint. Soft tissues are unremarkable.
IMPRESSION: Left shoulder arthroplasty appears well seated. No acute bony
abnormalities.

## 2017-04-09 ENCOUNTER — Other Ambulatory Visit: Payer: Self-pay | Admitting: Family

## 2017-04-09 DIAGNOSIS — I10 Essential (primary) hypertension: Secondary | ICD-10-CM

## 2017-04-24 ENCOUNTER — Encounter: Payer: Self-pay | Admitting: Family

## 2017-04-24 ENCOUNTER — Ambulatory Visit (INDEPENDENT_AMBULATORY_CARE_PROVIDER_SITE_OTHER): Payer: Medicare Other | Admitting: Family

## 2017-04-24 VITALS — BP 118/82 | HR 74 | Temp 97.9°F | Ht 70.0 in | Wt 304.0 lb

## 2017-04-24 DIAGNOSIS — H1013 Acute atopic conjunctivitis, bilateral: Secondary | ICD-10-CM | POA: Diagnosis not present

## 2017-04-24 DIAGNOSIS — I10 Essential (primary) hypertension: Secondary | ICD-10-CM | POA: Diagnosis not present

## 2017-04-24 DIAGNOSIS — E1165 Type 2 diabetes mellitus with hyperglycemia: Secondary | ICD-10-CM | POA: Diagnosis not present

## 2017-04-24 DIAGNOSIS — M1712 Unilateral primary osteoarthritis, left knee: Secondary | ICD-10-CM | POA: Diagnosis not present

## 2017-04-24 DIAGNOSIS — M19019 Primary osteoarthritis, unspecified shoulder: Secondary | ICD-10-CM

## 2017-04-24 DIAGNOSIS — E785 Hyperlipidemia, unspecified: Secondary | ICD-10-CM | POA: Diagnosis not present

## 2017-04-24 LAB — CMP14+EGFR
A/G RATIO: 1.6 (ref 1.2–2.2)
ALK PHOS: 71 IU/L (ref 39–117)
ALT: 22 IU/L (ref 0–44)
AST: 22 IU/L (ref 0–40)
Albumin: 4.2 g/dL (ref 3.6–4.8)
BILIRUBIN TOTAL: 0.9 mg/dL (ref 0.0–1.2)
BUN/Creatinine Ratio: 10 (ref 10–24)
BUN: 12 mg/dL (ref 8–27)
CHLORIDE: 101 mmol/L (ref 96–106)
CO2: 25 mmol/L (ref 20–29)
Calcium: 9.7 mg/dL (ref 8.6–10.2)
Creatinine, Ser: 1.26 mg/dL (ref 0.76–1.27)
GFR calc Af Amer: 70 mL/min/{1.73_m2} (ref >59)
GFR, EST NON AFRICAN AMERICAN: 60 mL/min/{1.73_m2} (ref >59)
GLOBULIN, TOTAL: 2.6 g/dL (ref 1.5–4.5)
Glucose: 155 mg/dL — ABNORMAL HIGH (ref 65–99)
POTASSIUM: 5.3 mmol/L — AB (ref 3.5–5.2)
SODIUM: 143 mmol/L (ref 134–144)
Total Protein: 6.8 g/dL (ref 6.0–8.5)

## 2017-04-24 LAB — LIPID PANEL
CHOL/HDL RATIO: 3.7 ratio (ref 0.0–5.0)
Cholesterol, Total: 145 mg/dL (ref 100–199)
HDL: 39 mg/dL — ABNORMAL LOW (ref >39)
LDL Calculated: 78 mg/dL (ref 0–99)
TRIGLYCERIDES: 141 mg/dL (ref 0–149)
VLDL Cholesterol Cal: 28 mg/dL (ref 5–40)

## 2017-04-24 LAB — BAYER DCA HB A1C WAIVED: HB A1C (BAYER DCA - WAIVED): 7.2 % — ABNORMAL HIGH (ref <7.0)

## 2017-04-24 MED ORDER — LOSARTAN POTASSIUM 100 MG PO TABS: 100.0000 mg | ORAL_TABLET | Freq: Every day | ORAL | 0 refills | Status: DC

## 2017-04-24 MED ORDER — OXYBUTYNIN CHLORIDE 5 MG PO TABS: 5.0000 mg | ORAL_TABLET | Freq: Two times a day (BID) | ORAL | 1 refills | Status: DC

## 2017-04-24 MED ORDER — PRAVASTATIN SODIUM 40 MG PO TABS: 40.0000 mg | ORAL_TABLET | Freq: Every day | ORAL | 1 refills | Status: DC

## 2017-04-24 MED ORDER — MELOXICAM 7.5 MG PO TABS
ORAL_TABLET | ORAL | 0 refills | Status: DC
Start: 2017-04-24 — End: 2017-07-19

## 2017-04-24 MED ORDER — OLOPATADINE HCL 0.2 % OP SOLN: 1.0000 [drp] | Freq: Every day | OPHTHALMIC | 0 refills | Status: DC

## 2017-04-24 MED ORDER — METFORMIN HCL 500 MG PO TABS: 500.0000 mg | ORAL_TABLET | Freq: Two times a day (BID) | ORAL | 0 refills | Status: DC

## 2017-04-24 NOTE — Progress Notes (Signed)
Subjective:    Patient ID: Adam Barrera, male    DOB: October 01, 1950, 66 y.o.   MRN: [id number]  PT presents to the office today chronic follow up.  Hypertension  This is a chronic problem. The current episode started more than 1 year ago. The problem has been resolved since onset. The problem is controlled. Pertinent negatives include no blurred vision, headaches, malaise/fatigue, peripheral edema or shortness of breath. Risk factors for coronary artery disease include dyslipidemia, obesity and sedentary lifestyle. The current treatment provides moderate improvement. There is no history of kidney disease, CAD/MI, CVA or heart failure.  Diabetes  He presents for his follow-up diabetic visit. He has type 2 diabetes mellitus. His disease course has been stable. There are no hypoglycemic associated symptoms. Pertinent negatives for hypoglycemia include no headaches. There are no diabetic associated symptoms. Pertinent negatives for diabetes include no blurred vision, no foot paresthesias, no foot ulcerations and no visual change. There are no hypoglycemic complications. Symptoms are stable. There are no diabetic complications. Pertinent negatives for diabetic complications include no CVA, heart disease, nephropathy or peripheral neuropathy. His weight is stable. His breakfast blood glucose range is generally 130-140 mg/dl. Eye exam is current.  Arthritis  Presents for follow-up visit. He complains of pain, stiffness and joint swelling. The symptoms have been stable. Affected locations include the right shoulder, left shoulder, right hip, left hip and left knee. His pain is at a severity of 2/10.  Hyperlipidemia  This is a chronic problem. The current episode started more than 1 year ago. The problem is controlled. Recent lipid tests were reviewed and are normal. Exacerbating diseases include obesity. Pertinent negatives include no shortness of breath. Current antihyperlipidemic treatment includes statins.  The current treatment provides mild improvement of lipids. Risk factors for coronary artery disease include diabetes mellitus, dyslipidemia, obesity, male sex, hypertension and post-menopausal.  Conjunctivitis   The current episode started more than 1 week ago. The onset was gradual. The problem occurs frequently. The problem has been unchanged. The problem is mild. Nothing relieves the symptoms. Associated symptoms include eye itching and eye redness. Pertinent negatives include no headaches.  OAB PT states he has "urinary leakage" at times, but the frequency has greatly improved since starting oxybutynin.    Review of Systems  Constitutional: Negative for malaise/fatigue.  Eyes: Positive for redness and itching. Negative for blurred vision.  Respiratory: Negative for shortness of breath.   Musculoskeletal: Positive for arthritis, joint swelling and stiffness.  Neurological: Negative for headaches.  All other systems reviewed and are negative.      Objective:   Physical Exam  Constitutional: He is oriented to person, place, and time. He appears well-developed and well-nourished. No distress.  HENT:  Head: Normocephalic.  Right Ear: External ear normal.  Left Ear: External ear normal.  Nose: Nose normal.  Mouth/Throat: Oropharynx is clear and moist.  Eyes: Pupils are equal, round, and reactive to light. Right eye exhibits no discharge. Left eye exhibits no discharge.  Neck: Normal range of motion. Neck supple. No thyromegaly present.  Cardiovascular: Normal rate, regular rhythm, normal heart sounds and intact distal pulses.   No murmur heard. Pulmonary/Chest: Effort normal and breath sounds normal. No respiratory distress. He has no wheezes.  Abdominal: Soft. Bowel sounds are normal. He exhibits no distension. There is no tenderness.  Musculoskeletal: Normal range of motion. He exhibits no edema or tenderness.  Neurological: He is alert and oriented to person, place, and time.    Skin: Skin  is warm and dry. No rash noted. No erythema.  Psychiatric: He has a normal mood and affect. His behavior is normal. Judgment and thought content normal.  Vitals reviewed.     BP 118/82   Pulse 74   Temp 97.9 F (36.6 C) (Oral)   Ht _0  (1.778 m)   Wt (!) 304 lb (137.9 kg)   BMI 43.62 kg/m      Assessment & Plan:  1. Essential hypertension - CMP14+EGFR  2. Type 2 diabetes mellitus with hyperglycemia, without long-term current use of insulin (HCC) - metFORMIN (GLUCOPHAGE) 500 MG tablet; Take 1 tablet (500 mg total) by mouth 2 (two) times daily with a meal.  Dispense: 180 tablet; Refill: 0 - Bayer DCA Hb A1c Waived - CMP14+EGFR - Microalbumin / creatinine urine ratio  3. Morbid obesity ([place])  - CMP14+EGFR  4. Primary osteoarthritis of left kne - meloxicam (MOBIC) 7.5 MG tablet; TAKE 2 TABLETS (15 MG TOTAL) BY MOUTH DAILY.  Dispense: 180 tablet; Refill: 0 - CMP14+EGFR  5. Shoulder arthritis - meloxicam (MOBIC) 7.5 MG tablet; TAKE 2 TABLETS (15 MG TOTAL) BY MOUTH DAILY.  Dispense: 180 tablet; Refill: 0 - CMP14+EGFR  6. Hyperlipidemia, unspecified hyperlipidemia type - pravastatin (PRAVACHOL) 40 MG tablet; Take 1 tablet (40 mg total) by mouth at bedtime.  Dispense: 90 tablet; Refill: 1 - CMP14+EGFR - Lipid panel  7. Essential hypertension, benign - losartan (COZAAR) 100 MG tablet; Take 1 tablet (100 mg total) by mouth at bedtime.  Dispense: 90 tablet; Refill: 0 - CMP14+EGFR  8. Allergic conjunctivitis of both eyes - Olopatadine HCl (PATADAY) 0.2 % SOLN; Apply 1 drop to eye daily.  Dispense: 5 mL; Refill: 0   Continue all meds Labs pending Health Maintenance reviewed Diet and exercise encouraged RTO 6 months   Evelina Dun, FNP

## 2017-04-24 NOTE — Patient Instructions (Signed)
Allergic Conjunctivitis, Adult      Allergic conjunctivitis is inflammation of the clear membrane that covers the white part of your eye and the inner surface of your eyelid (conjunctiva). The inflammation is caused by allergies. The blood vessels in the conjunctiva become inflamed and this causes the eyes to become red or pink. The eyes often feel itchy. Allergic conjunctivitis cannot be spread from one person to another person (is not contagious). What are the causes? This condition is caused by an allergic reaction. Common causes of an allergic reaction (allergens) include:  Outdoor allergens, such as: ? Pollen. ? Grass and weeds. ? Mold spores.  Indoor allergens, such as: ? Dust. ? Smoke. ? Mold. ? Pet dander. ? Animal hair.  What increases the risk? You may be more likely to develop this condition if you have a family history of allergies, such as:  Allergic rhinitis.  Bronchial asthma.  Atopic dermatitis.  What are the signs or symptoms? Symptoms of this condition include eyes that are:  Itchy.  Red.  Watery.  Puffy.  Your eyes may also:  Sting or burn.  Have clear drainage coming from them.  How is this diagnosed? This condition may be diagnosed by medical history and physical exam. If you have drainage from your eyes, it may be tested to rule out other causes of conjunctivitis. You may also need to see a health care provider who specializes in treating allergies (allergist) or eye conditions (ophthalmologist) for tests to confirm the diagnosis. You may have:  Skin tests to see which allergens are causing your symptoms. These tests involve pricking the skin with a tiny needle and exposing the skin to small amounts of potential allergens to see if your skin reacts.  Blood tests.  Tissue scrapings from your eyelid. These will be examined under a microscope.  How is this treated? Treatments for this condition may include:  Cold cloths (compresses) to  soothe itching and swelling.  Washing the face to remove allergens.  Eye drops. These may be prescription or over-the-counter. There are several different types. You may need to try different types to see which one works best for you. Your may need: ? Eye drops that block the allergic reaction (antihistamine). ? Eye drops that reduce swelling and irritation (anti-inflammatory). ? Steroid eye drops to lessen a severe reaction (vernal conjunctivitis).  Oral antihistamine medicines to reduce your allergic reaction. You may need these if eye drops do not help or are difficult to use.  Follow these instructions at home:  Avoid known allergens whenever possible.  Take or apply over-the-counter and prescription medicines only as told by your health care provider. These include any eye drops.  Apply a cool, clean washcloth to your eye for 10-20 minutes, 3-4 times a day.  Do not touch or rub your eyes.  Do not wear contact lenses until the inflammation is gone. Wear glasses instead.  Do not wear eye makeup until the inflammation is gone.  Keep all follow-up visits as told by your health care provider. This is important. Contact a health care provider if:  Your symptoms get worse or do not improve with treatment.  You have mild eye pain.  You have sensitivity to light.  You have spots or blisters on your eyes.  You have pus draining from your eye.  You have a fever. Get help right away if:  You have redness, swelling, or other symptoms in only one eye.  Your vision is blurred or you have   vision changes.  You have severe eye pain. This information is not intended to replace advice given to you by your health care provider. Make sure you discuss any questions you have with your health care provider. Document Released: 12/02/2002 Document Revised: 05/10/2016 Document Reviewed: 03/24/2016 Elsevier Interactive Patient Education  2018 [employer]  

## 2017-04-25 LAB — MICROALBUMIN / CREATININE URINE RATIO
CREATININE, UR: 140.2 mg/dL
MICROALB/CREAT RATIO: 6 mg/g{creat} (ref 0.0–30.0)
MICROALBUM., U, RANDOM: 8.4 ug/mL

## 2017-06-05 ENCOUNTER — Encounter: Payer: Self-pay | Admitting: *Deleted

## 2017-07-19 ENCOUNTER — Encounter: Payer: Self-pay | Admitting: *Deleted

## 2017-07-19 ENCOUNTER — Ambulatory Visit (INDEPENDENT_AMBULATORY_CARE_PROVIDER_SITE_OTHER): Payer: Medicare Other | Admitting: *Deleted

## 2017-07-19 VITALS — BP 127/85 | HR 75 | Ht 68.25 in | Wt 311.0 lb

## 2017-07-19 DIAGNOSIS — I10 Essential (primary) hypertension: Secondary | ICD-10-CM

## 2017-07-19 DIAGNOSIS — M1712 Unilateral primary osteoarthritis, left knee: Secondary | ICD-10-CM

## 2017-07-19 DIAGNOSIS — Z0000 Encounter for general adult medical examination without abnormal findings: Secondary | ICD-10-CM

## 2017-07-19 DIAGNOSIS — Z23 Encounter for immunization: Secondary | ICD-10-CM | POA: Diagnosis not present

## 2017-07-19 DIAGNOSIS — E1165 Type 2 diabetes mellitus with hyperglycemia: Secondary | ICD-10-CM

## 2017-07-19 MED ORDER — MELOXICAM 7.5 MG PO TABS: ORAL_TABLET | ORAL | 0 refills | Status: DC

## 2017-07-19 MED ORDER — METFORMIN HCL 500 MG PO TABS: 500.0000 mg | ORAL_TABLET | Freq: Two times a day (BID) | ORAL | 0 refills | Status: DC

## 2017-07-19 MED ORDER — LOSARTAN POTASSIUM 100 MG PO TABS: 100.0000 mg | ORAL_TABLET | Freq: Every day | ORAL | 0 refills | Status: DC

## 2017-07-19 NOTE — Patient Instructions (Signed)
  Adam Barrera , Thank you for taking time to come for your Medicare Wellness Visit. I appreciate your ongoing commitment to your health goals. Please review the following plan we discussed and let me know if I can assist you in the future.   These are the goals we discussed: Goals    . Exercise 150 minutes per week (moderate activity)    . Plan meals       This is a list of the screening recommended for you and due dates:  Health Maintenance  Topic Date Due  . Flu Shot  04/25/2017  . Eye exam for diabetics  07/27/2017  . Complete foot exam   10/23/2017  . Hemoglobin A1C  10/25/2017  . Tetanus Vaccine  04/12/2021  . Colon Cancer Screening  05/29/2021  .  Hepatitis C: One time screening is recommended by Center for Disease Control  (CDC) for  adults born from [birth year] through 1963.   Completed  . HIV Screening  Completed

## 2017-07-20 DIAGNOSIS — Z23 Encounter for immunization: Secondary | ICD-10-CM | POA: Diagnosis not present

## 2017-07-20 DIAGNOSIS — Z0000 Encounter for general adult medical examination without abnormal findings: Secondary | ICD-10-CM | POA: Diagnosis not present
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
## 2017-10-21 ENCOUNTER — Other Ambulatory Visit: Payer: Self-pay | Admitting: Family

## 2017-10-21 DIAGNOSIS — E1165 Type 2 diabetes mellitus with hyperglycemia: Secondary | ICD-10-CM

## 2017-10-21 DIAGNOSIS — I10 Essential (primary) hypertension: Secondary | ICD-10-CM

## 2017-10-22 NOTE — Telephone Encounter (Signed)
Last seen 04/24/17  [facility]

## 2017-10-25 ENCOUNTER — Ambulatory Visit (INDEPENDENT_AMBULATORY_CARE_PROVIDER_SITE_OTHER): Payer: Medicare Other | Admitting: Family

## 2017-10-25 ENCOUNTER — Encounter: Payer: Self-pay | Admitting: Family

## 2017-10-25 VITALS — BP 115/82 | HR 83 | Ht 68.25 in | Wt 304.2 lb

## 2017-10-25 DIAGNOSIS — E1169 Type 2 diabetes mellitus with other specified complication: Secondary | ICD-10-CM | POA: Diagnosis not present

## 2017-10-25 DIAGNOSIS — E1165 Type 2 diabetes mellitus with hyperglycemia: Secondary | ICD-10-CM

## 2017-10-25 DIAGNOSIS — H1013 Acute atopic conjunctivitis, bilateral: Secondary | ICD-10-CM

## 2017-10-25 DIAGNOSIS — E1159 Type 2 diabetes mellitus with other circulatory complications: Secondary | ICD-10-CM | POA: Diagnosis not present

## 2017-10-25 DIAGNOSIS — I10 Essential (primary) hypertension: Secondary | ICD-10-CM | POA: Diagnosis not present

## 2017-10-25 DIAGNOSIS — I152 Hypertension secondary to endocrine disorders: Secondary | ICD-10-CM

## 2017-10-25 DIAGNOSIS — Z87891 Personal history of nicotine dependence: Secondary | ICD-10-CM

## 2017-10-25 DIAGNOSIS — R062 Wheezing: Secondary | ICD-10-CM

## 2017-10-25 DIAGNOSIS — N3281 Overactive bladder: Secondary | ICD-10-CM

## 2017-10-25 DIAGNOSIS — E785 Hyperlipidemia, unspecified: Secondary | ICD-10-CM

## 2017-10-25 DIAGNOSIS — J069 Acute upper respiratory infection, unspecified: Secondary | ICD-10-CM

## 2017-10-25 LAB — BAYER DCA HB A1C WAIVED: HB A1C (BAYER DCA - WAIVED): 7 % — ABNORMAL HIGH (ref <7.0)

## 2017-10-25 MED ORDER — GLUCOSE BLOOD VI STRP: ORAL_STRIP | 4 refills | Status: DC

## 2017-10-25 MED ORDER — ONETOUCH DELICA LANCETS 33G MISC: 5 refills | Status: DC

## 2017-10-25 MED ORDER — OXYBUTYNIN CHLORIDE ER 10 MG PO TB24: 10.0000 mg | ORAL_TABLET | Freq: Every day | ORAL | 1 refills | Status: DC

## 2017-10-25 MED ORDER — OLOPATADINE HCL 0.2 % OP SOLN: 1.0000 [drp] | Freq: Every day | OPHTHALMIC | 0 refills | Status: DC

## 2017-10-25 MED ORDER — ALBUTEROL SULFATE HFA 108 (90 BASE) MCG/ACT IN AERS: 2.0000 | INHALATION_SPRAY | Freq: Four times a day (QID) | RESPIRATORY_TRACT | 0 refills | Status: DC | PRN

## 2017-10-25 MED ORDER — ASPIRIN EC 81 MG PO TBEC: 81.0000 mg | DELAYED_RELEASE_TABLET | Freq: Every day | ORAL | 1 refills | Status: DC

## 2017-10-25 NOTE — Addendum Note (Signed)
Addended by: Evelina Dun A on: 10/25/2017 10:21 AM   Modules accepted: Orders

## 2017-10-25 NOTE — Progress Notes (Signed)
Subjective:    Patient ID: Adam Barrera, male    DOB: 02-17-1951, 67 y.o.   MRN: [id number]  Pt presents to the office today for chronic follow up.  Diabetes  He presents for his follow-up diabetic visit. He has type 2 diabetes mellitus. His disease course has been stable. There are no hypoglycemic associated symptoms. There are no diabetic associated symptoms. Pertinent negatives for diabetes include no blurred vision, no foot paresthesias and no visual change. There are no hypoglycemic complications. Symptoms are stable. There are no diabetic complications. Pertinent negatives for diabetic complications include no CVA. Risk factors for coronary artery disease include diabetes mellitus, dyslipidemia, male sex, obesity, hypertension and sedentary lifestyle. He is following a generally unhealthy diet. His breakfast blood glucose range is generally 110-130 mg/dl.  Hyperlipidemia  This is a chronic problem. The current episode started more than 1 year ago. The problem is controlled. Recent lipid tests were reviewed and are normal. Exacerbating diseases include obesity. Pertinent negatives include no shortness of breath. Current antihyperlipidemic treatment includes statins. The current treatment provides moderate improvement of lipids.  Hypertension  This is a chronic problem. The current episode started more than 1 year ago. The problem has been resolved since onset. The problem is controlled. Associated symptoms include peripheral edema. Pertinent negatives include no blurred vision, malaise/fatigue or shortness of breath. Risk factors for coronary artery disease include diabetes mellitus, dyslipidemia, obesity, male gender and sedentary lifestyle. There is no history of kidney disease, CAD/MI, CVA or heart failure.  Arthritis  Presents for follow-up visit. He complains of pain and stiffness. The symptoms have been stable. Affected locations include the right knee, left knee, right shoulder and left  shoulder (back). His pain is at a severity of 3/10.  OAB PT states he continues to pee every 3 hours or will have leakage of urine. Currently taking Oxybutynin 5 mg BID.     Review of Systems  Constitutional: Negative for malaise/fatigue.  Eyes: Negative for blurred vision.  Respiratory: Negative for shortness of breath.   Musculoskeletal: Positive for arthritis and stiffness.  All other systems reviewed and are negative.      Objective:   Physical Exam  Constitutional: He is oriented to person, place, and time. He appears well-developed and well-nourished. No distress.  HENT:  Head: Normocephalic.  Right Ear: External ear normal.  Left Ear: External ear normal.  Nose: Nose normal.  Mouth/Throat: Oropharynx is clear and moist.  Eyes: Pupils are equal, round, and reactive to light. Right eye exhibits no discharge. Left eye exhibits no discharge.  Neck: Normal range of motion. Neck supple. No thyromegaly present.  Cardiovascular: Normal rate, regular rhythm, normal heart sounds and intact distal pulses.  No murmur heard. Pulmonary/Chest: Effort normal. No respiratory distress. He has no wheezes.  Abdominal: Soft. Bowel sounds are normal. He exhibits no distension. There is no tenderness.  Musculoskeletal: Normal range of motion. He exhibits no edema or tenderness.  Neurological: He is alert and oriented to person, place, and time.  Skin: Skin is warm and dry. No rash noted. No erythema.  Psychiatric: He has a normal mood and affect. His behavior is normal. Judgment and thought content normal.  Vitals reviewed.     BP 115/82   Pulse 83   Ht 5' 8.25" (1.734 m)   Wt (!) 304 lb 3.2 oz (138 kg)   BMI 45.92 kg/m      Assessment & Plan:  1. Allergic conjunctivitis of both eyes - Olopatadine  HCl (PATADAY) 0.2 % SOLN; Apply 1 drop to eye daily.  Dispense: 5 mL; Refill: 0 - CMP14+EGFR  2. Type 2 diabetes mellitus with hyperglycemia, without long-term current use of insulin  (HCC) - glucose blood test strip; One Touch verio test strips. Use to check BG once a day. Dx: type 2 DM E11.9  Dispense: 100 each; Refill: 4 - ONETOUCH DELICA LANCETS 81O MISC; Use to check BG once a day.  Dx: type 2 DM E11.9  Dispense: 50 each; Refill: 5 - CMP14+EGFR - Bayer DCA Hb A1c Waived  3. Hyperlipidemia associated with type 2 diabetes mellitus (HCC) - CMP14+EGFR - Lipid panel  4. Morbid obesity ([place]) - CMP14+EGFR  5. Hypertension associated with diabetes ([place]) - CMP14+EGFR  6. Former smoker - CMP14+EGFR  7. OAB (overactive bladder) We will change Oxybutynin 5 mg BID to oxybutynin XL 10 mg  - CMP14+EGFR - oxybutynin (DITROPAN XL) 10 MG 24 hr tablet; Take 1 tablet (10 mg total) by mouth at bedtime.  Dispense: 90 tablet; Refill: 1   Continue all meds Labs pending Health Maintenance reviewed Diet and exercise encouraged RTO 4 month   Evelina Dun, FNP

## 2017-10-25 NOTE — Patient Instructions (Signed)

## 2017-10-26 ENCOUNTER — Other Ambulatory Visit: Payer: Self-pay | Admitting: *Deleted

## 2017-10-26 LAB — CMP14+EGFR
ALK PHOS: 79 IU/L (ref 39–117)
ALT: 23 IU/L (ref 0–44)
AST: 21 IU/L (ref 0–40)
Albumin/Globulin Ratio: 1.4 (ref 1.2–2.2)
Albumin: 4.3 g/dL (ref 3.6–4.8)
BUN/Creatinine Ratio: 9 — ABNORMAL LOW (ref 10–24)
BUN: 11 mg/dL (ref 8–27)
Bilirubin Total: 0.7 mg/dL (ref 0.0–1.2)
CO2: 24 mmol/L (ref 20–29)
CREATININE: 1.25 mg/dL (ref 0.76–1.27)
Calcium: 10 mg/dL (ref 8.6–10.2)
Chloride: 100 mmol/L (ref 96–106)
GFR calc Af Amer: 70 mL/min/{1.73_m2} (ref >59)
GFR calc non Af Amer: 60 mL/min/{1.73_m2} (ref >59)
GLOBULIN, TOTAL: 3.1 g/dL (ref 1.5–4.5)
GLUCOSE: 143 mg/dL — AB (ref 65–99)
Potassium: 4.9 mmol/L (ref 3.5–5.2)
SODIUM: 142 mmol/L (ref 134–144)
Total Protein: 7.4 g/dL (ref 6.0–8.5)

## 2017-10-26 LAB — LIPID PANEL
CHOL/HDL RATIO: 3.5 ratio (ref 0.0–5.0)
CHOLESTEROL TOTAL: 134 mg/dL (ref 100–199)
HDL: 38 mg/dL — AB (ref >39)
LDL CALC: 79 mg/dL (ref 0–99)
Triglycerides: 84 mg/dL (ref 0–149)
VLDL CHOLESTEROL CAL: 17 mg/dL (ref 5–40)

## 2017-10-26 MED ORDER — BLOOD GLUCOSE MONITOR SYSTEM W/DEVICE KIT: PACK | 0 refills | Status: DC

## 2017-10-29 ENCOUNTER — Other Ambulatory Visit: Payer: Self-pay | Admitting: *Deleted

## 2017-10-29 DIAGNOSIS — E1165 Type 2 diabetes mellitus with hyperglycemia: Secondary | ICD-10-CM

## 2017-10-30 MED ORDER — GLUCOSE BLOOD VI STRP: ORAL_STRIP | 3 refills | Status: DC

## 2017-10-30 MED ORDER — ACCU-CHEK SOFT TOUCH LANCETS MISC: 3 refills | Status: DC

## 2017-11-06 ENCOUNTER — Other Ambulatory Visit: Payer: Self-pay | Admitting: Family

## 2017-11-06 DIAGNOSIS — E785 Hyperlipidemia, unspecified: Secondary | ICD-10-CM

## 2017-11-07 ENCOUNTER — Other Ambulatory Visit: Payer: Self-pay | Admitting: *Deleted

## 2017-11-07 MED ORDER — GLUCOSE BLOOD VI STRP: ORAL_STRIP | 3 refills | Status: DC

## 2017-11-07 MED ORDER — LANCETS MISC: 3 refills | Status: DC

## 2017-11-07 NOTE — Telephone Encounter (Signed)
Pts insurance will pay for Accu-Check guide Test strips & lancets sent to [facility]

## 2017-11-14 ENCOUNTER — Other Ambulatory Visit: Payer: Self-pay | Admitting: Family

## 2017-11-14 DIAGNOSIS — R062 Wheezing: Secondary | ICD-10-CM

## 2017-11-14 DIAGNOSIS — J069 Acute upper respiratory infection, unspecified: Secondary | ICD-10-CM

## 2017-11-19 ENCOUNTER — Other Ambulatory Visit: Payer: Self-pay | Admitting: *Deleted

## 2017-11-19 DIAGNOSIS — H1013 Acute atopic conjunctivitis, bilateral: Secondary | ICD-10-CM

## 2017-11-19 MED ORDER — OLOPATADINE HCL 0.2 % OP SOLN: 1.0000 [drp] | Freq: Every day | OPHTHALMIC | 0 refills | Status: DC

## 2017-12-30 ENCOUNTER — Other Ambulatory Visit: Payer: Self-pay | Admitting: Family

## 2017-12-30 DIAGNOSIS — M1712 Unilateral primary osteoarthritis, left knee: Secondary | ICD-10-CM

## 2018-02-26 ENCOUNTER — Ambulatory Visit (INDEPENDENT_AMBULATORY_CARE_PROVIDER_SITE_OTHER): Payer: Medicare Other | Admitting: Family

## 2018-02-26 ENCOUNTER — Encounter: Payer: Self-pay | Admitting: Family

## 2018-02-26 VITALS — BP 108/70 | HR 68 | Temp 97.6°F | Ht 68.25 in | Wt 304.0 lb

## 2018-02-26 DIAGNOSIS — E1165 Type 2 diabetes mellitus with hyperglycemia: Secondary | ICD-10-CM | POA: Diagnosis not present

## 2018-02-26 DIAGNOSIS — M1712 Unilateral primary osteoarthritis, left knee: Secondary | ICD-10-CM

## 2018-02-26 DIAGNOSIS — E785 Hyperlipidemia, unspecified: Secondary | ICD-10-CM

## 2018-02-26 DIAGNOSIS — E1169 Type 2 diabetes mellitus with other specified complication: Secondary | ICD-10-CM | POA: Diagnosis not present

## 2018-02-26 DIAGNOSIS — E1159 Type 2 diabetes mellitus with other circulatory complications: Secondary | ICD-10-CM | POA: Diagnosis not present

## 2018-02-26 DIAGNOSIS — Z23 Encounter for immunization: Secondary | ICD-10-CM

## 2018-02-26 DIAGNOSIS — I10 Essential (primary) hypertension: Secondary | ICD-10-CM

## 2018-02-26 DIAGNOSIS — N3281 Overactive bladder: Secondary | ICD-10-CM | POA: Diagnosis not present

## 2018-02-26 LAB — BAYER DCA HB A1C WAIVED: HB A1C (BAYER DCA - WAIVED): 6.7 % (ref <7.0)

## 2018-02-26 NOTE — Progress Notes (Signed)
Subjective:    Patient ID: Adam Barrera, male    DOB: [birth year], 67 y.o.   MRN: [id number]  Chief Complaint  Patient presents with  . Diabetes    six month recheck    Diabetes  He presents for his follow-up diabetic visit. He has type 2 diabetes mellitus. His disease course has been stable. There are no hypoglycemic associated symptoms. Pertinent negatives for hypoglycemia include no headaches or nervousness/anxiousness. There are no diabetic associated symptoms. Pertinent negatives for diabetes include no blurred vision, no foot paresthesias and no visual change. There are no hypoglycemic complications. Symptoms are stable. Pertinent negatives for diabetic complications include no CVA, heart disease or peripheral neuropathy. Risk factors for coronary artery disease include diabetes mellitus, dyslipidemia, male sex, hypertension and sedentary lifestyle. He is following a generally unhealthy diet. His overall blood glucose range is 140-180 mg/dl. Eye exam is current.  Hypertension  This is a chronic problem. The current episode started more than 1 year ago. The problem has been resolved since onset. The problem is controlled. Pertinent negatives include no blurred vision, headaches, peripheral edema or shortness of breath. Risk factors for coronary artery disease include dyslipidemia and obesity. The current treatment provides moderate improvement. There is no history of kidney disease, CAD/MI or CVA.  Hyperlipidemia  This is a chronic problem. The current episode started more than 1 year ago. The problem is controlled. Recent lipid tests were reviewed and are normal. Exacerbating diseases include obesity. Pertinent negatives include no shortness of breath. Current antihyperlipidemic treatment includes statins. The current treatment provides moderate improvement of lipids. Risk factors for coronary artery disease include dyslipidemia, diabetes mellitus, male sex and hypertension.  OAB Pt taking  oxybutynin that helps.     Review of Systems  Eyes: Negative for blurred vision.  Respiratory: Negative for shortness of breath.   Neurological: Negative for headaches.  Psychiatric/Behavioral: The patient is not nervous/anxious.   All other systems reviewed and are negative.      Objective:   Physical Exam  Constitutional: He is oriented to person, place, and time. He appears well-developed and well-nourished. No distress.  HENT:  Head: Normocephalic.  Right Ear: External ear normal.  Left Ear: External ear normal.  Mouth/Throat: Oropharynx is clear and moist.  Eyes: Pupils are equal, round, and reactive to light. Right eye exhibits no discharge. Left eye exhibits no discharge.  Neck: Normal range of motion. Neck supple. No thyromegaly present.  Cardiovascular: Normal rate, regular rhythm, normal heart sounds and intact distal pulses.  No murmur heard. Pulmonary/Chest: Effort normal and breath sounds normal. No respiratory distress. He has no wheezes.  Abdominal: Soft. Bowel sounds are normal. He exhibits no distension. There is no tenderness.  Musculoskeletal: Normal range of motion. He exhibits edema (trace BLE). He exhibits no tenderness.  Neurological: He is alert and oriented to person, place, and time. He has normal reflexes. No cranial nerve deficit.  Skin: Skin is warm and dry. No rash noted. No erythema.  Psychiatric: He has a normal mood and affect. His behavior is normal. Judgment and thought content normal.  Vitals reviewed.     BP 108/70   Pulse 68   Temp 97.6 F (36.4 C) (Oral)   Ht 5' 8.25" (1.734 m)   Wt (!) 304 lb (137.9 kg)   BMI 45.89 kg/m      Assessment & Plan:  Adam Barrera comes in today with chief complaint of Diabetes (six month recheck)   Diagnosis and orders  addressed:  1. Hypertension associated with diabetes ([place]) - CMP14+EGFR  2. Hyperlipidemia associated with type 2 diabetes mellitus (HCC) - CMP14+EGFR - Lipid panel  3. Type  2 diabetes mellitus with hyperglycemia, without long-term current use of insulin (HCC) - Bayer DCA Hb A1c Waived - CMP14+EGFR  4. Primary osteoarthritis of left knee  - CMP14+EGFR  5. Morbid obesity ([place]) - CMP14+EGFR  6. OAB (overactive bladder) - CMP14+EGFR   Labs pending Health Maintenance reviewed Diet and exercise encouraged  Follow up plan: 6 months   Evelina Dun, FNP

## 2018-02-26 NOTE — Addendum Note (Signed)
Addended by: Shelbie Ammons on: 02/26/2018 11:01 AM   Modules accepted: Orders

## 2018-02-26 NOTE — Patient Instructions (Signed)

## 2018-02-27 LAB — CMP14+EGFR
ALT: 19 IU/L (ref 0–44)
AST: 16 IU/L (ref 0–40)
Albumin/Globulin Ratio: 1.8 (ref 1.2–2.2)
Albumin: 4.2 g/dL (ref 3.6–4.8)
Alkaline Phosphatase: 65 IU/L (ref 39–117)
BUN/Creatinine Ratio: 8 — ABNORMAL LOW (ref 10–24)
BUN: 11 mg/dL (ref 8–27)
Bilirubin Total: 0.8 mg/dL (ref 0.0–1.2)
CALCIUM: 9.1 mg/dL (ref 8.6–10.2)
CO2: 25 mmol/L (ref 20–29)
CREATININE: 1.3 mg/dL — AB (ref 0.76–1.27)
Chloride: 103 mmol/L (ref 96–106)
GFR, EST AFRICAN AMERICAN: 67 mL/min/{1.73_m2} (ref >59)
GFR, EST NON AFRICAN AMERICAN: 58 mL/min/{1.73_m2} — AB (ref >59)
GLUCOSE: 145 mg/dL — AB (ref 65–99)
Globulin, Total: 2.4 g/dL (ref 1.5–4.5)
Potassium: 4.9 mmol/L (ref 3.5–5.2)
Sodium: 141 mmol/L (ref 134–144)
TOTAL PROTEIN: 6.6 g/dL (ref 6.0–8.5)

## 2018-02-27 LAB — LIPID PANEL
CHOL/HDL RATIO: 3.4 ratio (ref 0.0–5.0)
Cholesterol, Total: 120 mg/dL (ref 100–199)
HDL: 35 mg/dL — AB (ref >39)
LDL Calculated: 62 mg/dL (ref 0–99)
TRIGLYCERIDES: 113 mg/dL (ref 0–149)
VLDL Cholesterol Cal: 23 mg/dL (ref 5–40)

## 2018-03-30 ENCOUNTER — Other Ambulatory Visit: Payer: Self-pay | Admitting: Family

## 2018-03-30 DIAGNOSIS — M1712 Unilateral primary osteoarthritis, left knee: Secondary | ICD-10-CM

## 2018-04-23 ENCOUNTER — Other Ambulatory Visit: Payer: Self-pay | Admitting: Family

## 2018-04-23 DIAGNOSIS — N3281 Overactive bladder: Secondary | ICD-10-CM

## 2018-04-23 DIAGNOSIS — E1165 Type 2 diabetes mellitus with hyperglycemia: Secondary | ICD-10-CM

## 2018-04-25 ENCOUNTER — Other Ambulatory Visit: Payer: Self-pay | Admitting: Family

## 2018-04-25 DIAGNOSIS — I10 Essential (primary) hypertension: Secondary | ICD-10-CM

## 2018-06-01 ENCOUNTER — Other Ambulatory Visit: Payer: Self-pay | Admitting: Family

## 2018-06-01 DIAGNOSIS — E785 Hyperlipidemia, unspecified: Secondary | ICD-10-CM

## 2018-07-10 ENCOUNTER — Other Ambulatory Visit: Payer: Self-pay | Admitting: Family

## 2018-07-10 DIAGNOSIS — M1712 Unilateral primary osteoarthritis, left knee: Secondary | ICD-10-CM

## 2018-07-18 ENCOUNTER — Other Ambulatory Visit: Payer: Self-pay | Admitting: Family

## 2018-07-18 DIAGNOSIS — E1165 Type 2 diabetes mellitus with hyperglycemia: Secondary | ICD-10-CM

## 2018-07-18 DIAGNOSIS — I10 Essential (primary) hypertension: Secondary | ICD-10-CM

## 2018-07-23 ENCOUNTER — Encounter: Payer: Self-pay | Admitting: *Deleted

## 2018-07-23 ENCOUNTER — Ambulatory Visit (INDEPENDENT_AMBULATORY_CARE_PROVIDER_SITE_OTHER): Payer: Medicare Other | Admitting: *Deleted

## 2018-07-23 VITALS — BP 108/71 | HR 83 | Ht 68.0 in | Wt 303.0 lb

## 2018-07-23 DIAGNOSIS — Z0000 Encounter for general adult medical examination without abnormal findings: Secondary | ICD-10-CM

## 2018-07-23 DIAGNOSIS — Z23 Encounter for immunization: Secondary | ICD-10-CM | POA: Diagnosis not present

## 2018-07-23 NOTE — Patient Instructions (Addendum)
Adam Barrera , Thank you for taking time to come for your Medicare Wellness Visit. I appreciate your ongoing commitment to your health goals. Please review the following plan we discussed and let me know if I can assist you in the future.   These are the goals we discussed: Goals    . DIET - INCREASE WATER INTAKE    . Exercise 150 minutes per week (moderate activity)    . Plan meals       This is a list of the screening recommended for you and due dates:  Health Maintenance  Topic Date Due  . Eye exam for diabetics  07/27/2017  . Flu Shot  04/25/2018  . Hemoglobin A1C  08/28/2018  . Complete foot exam   10/25/2018  . Tetanus Vaccine  04/12/2021  . Colon Cancer Screening  05/29/2021  . Pneumonia vaccines (2 of 2 - PPSV23) 07/18/2021  .  Hepatitis C: One time screening is recommended by Center for Disease Control  (CDC) for  adults born from [birth year] through 1963.   Completed  . HIV Screening  Completed     Review Advanced Directives and bring back a signed notarized copy.  Setup appointment with audiologist next year [phone] Schedule routine eye exam with Dr Radford Pax. Have him send a copy to our office.   Diabetes Mellitus and Nutrition When you have diabetes (diabetes mellitus), it is very important to have healthy eating habits because your blood sugar (glucose) levels are greatly affected by what you eat and drink. Eating healthy foods in the appropriate amounts, at about the same times every day, can help you:  Control your blood glucose.  Lower your risk of heart disease.  Improve your blood pressure.  Reach or maintain a healthy weight.  Every person with diabetes is different, and each person has different needs for a meal plan. Your health care provider may recommend that you work with a diet and nutrition specialist (dietitian) to make a meal plan that is best for you. Your meal plan may vary depending on factors such as:  The calories you need.  The medicines you  take.  Your weight.  Your blood glucose, blood pressure, and cholesterol levels.  Your activity level.  Other health conditions you have, such as heart or kidney disease.  How do carbohydrates affect me? Carbohydrates affect your blood glucose level more than any other type of food. Eating carbohydrates naturally increases the amount of glucose in your blood. Carbohydrate counting is a method for keeping track of how many carbohydrates you eat. Counting carbohydrates is important to keep your blood glucose at a healthy level, especially if you use insulin or take certain oral diabetes medicines. It is important to know how many carbohydrates you can safely have in each meal. This is different for every person. Your dietitian can help you calculate how many carbohydrates you should have at each meal and for snack. Foods that contain carbohydrates include:  Bread, cereal, rice, pasta, and crackers.  Potatoes and corn.  Peas, beans, and lentils.  Milk and yogurt.  Fruit and juice.  Desserts, such as cakes, cookies, ice cream, and candy.  How does alcohol affect me? Alcohol can cause a sudden decrease in blood glucose (hypoglycemia), especially if you use insulin or take certain oral diabetes medicines. Hypoglycemia can be a life-threatening condition. Symptoms of hypoglycemia (sleepiness, dizziness, and confusion) are similar to symptoms of having too much alcohol. If your health care provider says that alcohol is safe  for you, follow these guidelines:  Limit alcohol intake to no more than 1 drink per day for nonpregnant women and 2 drinks per day for men. One drink equals 12 oz of beer, 5 oz of wine, or 1 oz of hard liquor.  Do not drink on an empty stomach.  Keep yourself hydrated with water, diet soda, or unsweetened iced tea.  Keep in mind that regular soda, juice, and other mixers may contain a lot of sugar and must be counted as carbohydrates.  What are tips for following  this plan? Reading food labels  Start by checking the serving size on the label. The amount of calories, carbohydrates, fats, and other nutrients listed on the label are based on one serving of the food. Many foods contain more than one serving per package.  Check the total grams (g) of carbohydrates in one serving. You can calculate the number of servings of carbohydrates in one serving by dividing the total carbohydrates by 15. For example, if a food has 30 g of total carbohydrates, it would be equal to 2 servings of carbohydrates.  Check the number of grams (g) of saturated and trans fats in one serving. Choose foods that have low or no amount of these fats.  Check the number of milligrams (mg) of sodium in one serving. Most people should limit total sodium intake to less than 2,300 mg per day.  Always check the nutrition information of foods labeled as "low-fat" or "nonfat". These foods may be higher in added sugar or refined carbohydrates and should be avoided.  Talk to your dietitian to identify your daily goals for nutrients listed on the label. Shopping  Avoid buying canned, premade, or processed foods. These foods tend to be high in fat, sodium, and added sugar.  Shop around the outside edge of the grocery store. This includes fresh fruits and vegetables, bulk grains, fresh meats, and fresh dairy. Cooking  Use low-heat cooking methods, such as baking, instead of high-heat cooking methods like deep frying.  Cook using healthy oils, such as olive, canola, or sunflower oil.  Avoid cooking with butter, cream, or high-fat meats. Meal planning  Eat meals and snacks regularly, preferably at the same times every day. Avoid going long periods of time without eating.  Eat foods high in fiber, such as fresh fruits, vegetables, beans, and whole grains. Talk to your dietitian about how many servings of carbohydrates you can eat at each meal.  Eat 4-6 ounces of lean protein each day, such  as lean meat, chicken, fish, eggs, or tofu. 1 ounce is equal to 1 ounce of meat, chicken, or fish, 1 egg, or 1/4 cup of tofu.  Eat some foods each day that contain healthy fats, such as avocado, nuts, seeds, and fish. Lifestyle   Check your blood glucose regularly.  Exercise at least 30 minutes 5 or more days each week, or as told by your health care provider.  Take medicines as told by your health care provider.  Do not use any products that contain nicotine or tobacco, such as cigarettes and e-cigarettes. If you need help quitting, ask your health care provider.  Work with a Social worker or diabetes educator to identify strategies to manage stress and any emotional and social challenges. What are some questions to ask my health care provider?  Do I need to meet with a diabetes educator?  Do I need to meet with a dietitian?  What number can I call if I have questions?  When are the best times to check my blood glucose? Where to find more information:  American Diabetes Association: diabetes.org/food-and-fitness/food  Academy of Nutrition and Dietetics: PokerClues.dk  [employer] of Diabetes and Digestive and Kidney Diseases (NIH): ContactWire.be Summary  A healthy meal plan will help you control your blood glucose and maintain a healthy lifestyle.  Working with a diet and nutrition specialist (dietitian) can help you make a meal plan that is best for you.  Keep in mind that carbohydrates and alcohol have immediate effects on your blood glucose levels. It is important to count carbohydrates and to use alcohol carefully. This information is not intended to replace advice given to you by your health care provider. Make sure you discuss any questions you have with your health care provider. Document Released: 06/08/2005 Document Revised: 10/16/2016  Document Reviewed: 10/16/2016 Elsevier Interactive Patient Education  [employer].
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
## 2018-07-24 ENCOUNTER — Encounter: Payer: Self-pay | Admitting: *Deleted

## 2018-07-24 DIAGNOSIS — Z23 Encounter for immunization: Secondary | ICD-10-CM

## 2018-07-24 DIAGNOSIS — Z0000 Encounter for general adult medical examination without abnormal findings: Secondary | ICD-10-CM | POA: Diagnosis not present

## 2018-08-29 ENCOUNTER — Ambulatory Visit (INDEPENDENT_AMBULATORY_CARE_PROVIDER_SITE_OTHER): Payer: Medicare Other | Admitting: Family

## 2018-08-29 ENCOUNTER — Encounter: Payer: Self-pay | Admitting: Family

## 2018-08-29 VITALS — BP 121/74 | HR 83 | Temp 97.5°F | Ht 68.0 in | Wt 299.8 lb

## 2018-08-29 DIAGNOSIS — E1159 Type 2 diabetes mellitus with other circulatory complications: Secondary | ICD-10-CM | POA: Diagnosis not present

## 2018-08-29 DIAGNOSIS — E1169 Type 2 diabetes mellitus with other specified complication: Secondary | ICD-10-CM | POA: Diagnosis not present

## 2018-08-29 DIAGNOSIS — R351 Nocturia: Secondary | ICD-10-CM | POA: Diagnosis not present

## 2018-08-29 DIAGNOSIS — E1165 Type 2 diabetes mellitus with hyperglycemia: Secondary | ICD-10-CM | POA: Diagnosis not present

## 2018-08-29 DIAGNOSIS — Z125 Encounter for screening for malignant neoplasm of prostate: Secondary | ICD-10-CM | POA: Diagnosis not present

## 2018-08-29 DIAGNOSIS — M1712 Unilateral primary osteoarthritis, left knee: Secondary | ICD-10-CM

## 2018-08-29 DIAGNOSIS — I10 Essential (primary) hypertension: Secondary | ICD-10-CM

## 2018-08-29 DIAGNOSIS — E785 Hyperlipidemia, unspecified: Secondary | ICD-10-CM

## 2018-08-29 NOTE — Patient Instructions (Signed)
Diabetes Mellitus and Nutrition When you have diabetes (diabetes mellitus), it is very important to have healthy eating habits because your blood sugar (glucose) levels are greatly affected by what you eat and drink. Eating healthy foods in the appropriate amounts, at about the same times every day, can help you:  Control your blood glucose.  Lower your risk of heart disease.  Improve your blood pressure.  Reach or maintain a healthy weight.  Every person with diabetes is different, and each person has different needs for a meal plan. Your health care provider may recommend that you work with a diet and nutrition specialist (dietitian) to make a meal plan that is best for you. Your meal plan may vary depending on factors such as:  The calories you need.  The medicines you take.  Your weight.  Your blood glucose, blood pressure, and cholesterol levels.  Your activity level.  Other health conditions you have, such as heart or kidney disease.  How do carbohydrates affect me? Carbohydrates affect your blood glucose level more than any other type of food. Eating carbohydrates naturally increases the amount of glucose in your blood. Carbohydrate counting is a method for keeping track of how many carbohydrates you eat. Counting carbohydrates is important to keep your blood glucose at a healthy level, especially if you use insulin or take certain oral diabetes medicines. It is important to know how many carbohydrates you can safely have in each meal. This is different for every person. Your dietitian can help you calculate how many carbohydrates you should have at each meal and for snack. Foods that contain carbohydrates include:  Bread, cereal, rice, pasta, and crackers.  Potatoes and corn.  Peas, beans, and lentils.  Milk and yogurt.  Fruit and juice.  Desserts, such as cakes, cookies, ice cream, and candy.  How does alcohol affect me? Alcohol can cause a sudden decrease in blood  glucose (hypoglycemia), especially if you use insulin or take certain oral diabetes medicines. Hypoglycemia can be a life-threatening condition. Symptoms of hypoglycemia (sleepiness, dizziness, and confusion) are similar to symptoms of having too much alcohol. If your health care provider says that alcohol is safe for you, follow these guidelines:  Limit alcohol intake to no more than 1 drink per day for nonpregnant women and 2 drinks per day for men. One drink equals 12 oz of beer, 5 oz of wine, or 1 oz of hard liquor.  Do not drink on an empty stomach.  Keep yourself hydrated with water, diet soda, or unsweetened iced tea.  Keep in mind that regular soda, juice, and other mixers may contain a lot of sugar and must be counted as carbohydrates.  What are tips for following this plan? Reading food labels  Start by checking the serving size on the label. The amount of calories, carbohydrates, fats, and other nutrients listed on the label are based on one serving of the food. Many foods contain more than one serving per package.  Check the total grams (g) of carbohydrates in one serving. You can calculate the number of servings of carbohydrates in one serving by dividing the total carbohydrates by 15. For example, if a food has 30 g of total carbohydrates, it would be equal to 2 servings of carbohydrates.  Check the number of grams (g) of saturated and trans fats in one serving. Choose foods that have low or no amount of these fats.  Check the number of milligrams (mg) of sodium in one serving. Most people   should limit total sodium intake to less than 2,300 mg per day.  Always check the nutrition information of foods labeled as "low-fat" or "nonfat". These foods may be higher in added sugar or refined carbohydrates and should be avoided.  Talk to your dietitian to identify your daily goals for nutrients listed on the label. Shopping  Avoid buying canned, premade, or processed foods. These  foods tend to be high in fat, sodium, and added sugar.  Shop around the outside edge of the grocery store. This includes fresh fruits and vegetables, bulk grains, fresh meats, and fresh dairy. Cooking  Use low-heat cooking methods, such as baking, instead of high-heat cooking methods like deep frying.  Cook using healthy oils, such as olive, canola, or sunflower oil.  Avoid cooking with butter, cream, or high-fat meats. Meal planning  Eat meals and snacks regularly, preferably at the same times every day. Avoid going long periods of time without eating.  Eat foods high in fiber, such as fresh fruits, vegetables, beans, and whole grains. Talk to your dietitian about how many servings of carbohydrates you can eat at each meal.  Eat 4-6 ounces of lean protein each day, such as lean meat, chicken, fish, eggs, or tofu. 1 ounce is equal to 1 ounce of meat, chicken, or fish, 1 egg, or 1/4 cup of tofu.  Eat some foods each day that contain healthy fats, such as avocado, nuts, seeds, and fish. Lifestyle   Check your blood glucose regularly.  Exercise at least 30 minutes 5 or more days each week, or as told by your health care provider.  Take medicines as told by your health care provider.  Do not use any products that contain nicotine or tobacco, such as cigarettes and e-cigarettes. If you need help quitting, ask your health care provider.  Work with a counselor or diabetes educator to identify strategies to manage stress and any emotional and social challenges. What are some questions to ask my health care provider?  Do I need to meet with a diabetes educator?  Do I need to meet with a dietitian?  What number can I call if I have questions?  When are the best times to check my blood glucose? Where to find more information:  American Diabetes Association: diabetes.org/food-and-fitness/food  Academy of Nutrition and Dietetics:  www.eatright.org/resources/health/diseases-and-conditions/diabetes  National Institute of Diabetes and Digestive and Kidney Diseases (NIH): www.niddk.nih.gov/health-information/diabetes/overview/diet-eating-physical-activity Summary  A healthy meal plan will help you control your blood glucose and maintain a healthy lifestyle.  Working with a diet and nutrition specialist (dietitian) can help you make a meal plan that is best for you.  Keep in mind that carbohydrates and alcohol have immediate effects on your blood glucose levels. It is important to count carbohydrates and to use alcohol carefully. This information is not intended to replace advice given to you by your health care provider. Make sure you discuss any questions you have with your health care provider. Document Released: 06/08/2005 Document Revised: 10/16/2016 Document Reviewed: 10/16/2016 Elsevier Interactive Patient Education  2018 Elsevier Inc.  

## 2018-08-29 NOTE — Progress Notes (Signed)
Subjective:    Patient ID: Adam Barrera, male    DOB: [birth year], 67 y.o.   MRN: [id number]  Chief Complaint  Patient presents with  . Medical Management of Chronic Issues    six month recheck   Pt presents to the office today for chronic follow up.  Diabetes  He presents for his follow-up diabetic visit. He has type 2 diabetes mellitus. His disease course has been stable. There are no hypoglycemic associated symptoms. Pertinent negatives for diabetes include no blurred vision and no foot paresthesias. Symptoms are stable. Pertinent negatives for diabetic complications include no CVA, heart disease, nephropathy or peripheral neuropathy. Risk factors for coronary artery disease include diabetes mellitus, dyslipidemia, obesity and male sex. He is following a generally unhealthy diet. His overall blood glucose range is 140-180 mg/dl. Eye exam is not current.  Hypertension  This is a chronic problem. The current episode started more than 1 year ago. The problem has been resolved since onset. The problem is controlled. Pertinent negatives include no blurred vision, peripheral edema or shortness of breath. Risk factors for coronary artery disease include dyslipidemia, diabetes mellitus, male gender, obesity and sedentary lifestyle. The current treatment provides moderate improvement. Hypertensive end-organ damage includes heart failure. There is no history of CAD/MI or CVA.  Hyperlipidemia  This is a chronic problem. The current episode started more than 1 year ago. Recent lipid tests were reviewed and are normal. Exacerbating diseases include obesity. Pertinent negatives include no shortness of breath. Current antihyperlipidemic treatment includes statins. The current treatment provides moderate improvement of lipids. Risk factors for coronary artery disease include diabetes mellitus, dyslipidemia, male sex, hypertension and a sedentary lifestyle.  Arthritis  Presents for follow-up visit. He complains  of pain and stiffness. The symptoms have been stable. Affected locations include the right knee and left knee (back). His pain is at a severity of 6/10.      Review of Systems  Eyes: Negative for blurred vision.  Respiratory: Negative for shortness of breath.   Musculoskeletal: Positive for arthritis and stiffness.  All other systems reviewed and are negative.  Family History  Problem Relation Age of Onset  . Prostate cancer Unknown        family history   . Prostate cancer Father   . Colon cancer Father   . Cancer Father   . Hypertension Father   . Diabetes Mother   . Stroke Mother   . Alzheimer's disease Mother   . Hypertension Mother   . Stomach cancer Brother 67  . Cancer Brother   . Colon cancer Brother 62   Social History   Socioeconomic History  . Marital status: Married    Spouse name: Not on file  . Number of children: 2  . Years of education: 28  . Highest education level: Associate degree: occupational, Hotel manager, or vocational program  Occupational History  . Occupation: Retired from [employer]  . Financial resource strain: Not hard at all  . Food insecurity:    Worry: Never true    Inability: Never true  . Transportation needs:    Medical: No    Non-medical: No  Tobacco Use  . Smoking status: Former Smoker    Packs/day: 1.00    Years: 30.00    Pack years: 30.00    Types: Cigarettes  . Smokeless tobacco: Never Used  Substance and Sexual Activity  . Alcohol use: No  . Drug use: No  . Sexual activity: Yes  Lifestyle  .  Physical activity:    Days per week: 3 days    Minutes per session: 30 min  . Stress: Only a little  Relationships  . Social connections:    Talks on phone: More than three times a week    Gets together: More than three times a week    Attends religious service: More than 4 times per year    Active member of club or organization: Yes    Attends meetings of clubs or organizations: More than 4 times per year     Relationship status: Married  Other Topics Concern  . Not on file  Social History Narrative  . Not on file        Objective:   Physical Exam  Constitutional: He is oriented to person, place, and time. He appears well-developed and well-nourished. No distress.  HENT:  Head: Normocephalic.  Right Ear: External ear normal.  Left Ear: External ear normal.  Mouth/Throat: Oropharynx is clear and moist.  Eyes: Pupils are equal, round, and reactive to light. Right eye exhibits no discharge. Left eye exhibits no discharge.  Neck: Normal range of motion. Neck supple. No thyromegaly present.  Cardiovascular: Normal rate, regular rhythm, normal heart sounds and intact distal pulses.  No murmur heard. Pulmonary/Chest: Effort normal and breath sounds normal. No respiratory distress. He has no wheezes.  Abdominal: Soft. Bowel sounds are normal. He exhibits no distension. There is no tenderness.  Musculoskeletal: Normal range of motion. He exhibits no edema or tenderness.  Neurological: He is alert and oriented to person, place, and time. He has normal reflexes. No cranial nerve deficit.  Skin: Skin is warm and dry. No rash noted. No erythema.  Psychiatric: He has a normal mood and affect. His behavior is normal. Judgment and thought content normal.  Vitals reviewed.   BP 121/74   Pulse 83   Temp (!) 97.5 F (36.4 C) (Oral)   Ht '5\' 8"'  (1.727 m)   Wt 299 lb 12.8 oz (136 kg)   BMI 45.58 kg/m      Assessment & Plan:  Fausto Sampedro Bergeson comes in today with chief complaint of Medical Management of Chronic Issues (six month recheck)   Diagnosis and orders addressed:  1. Hypertension associated with diabetes ([facility]) - CMP14+EGFR - CBC with Differential/Platelet  2. Hyperlipidemia associated with type 2 diabetes mellitus ([facility]) - CMP14+EGFR - Lipid panel - CBC with Differential/Platelet  3. Type 2 diabetes mellitus with hyperglycemia, without long-term current use of insulin ([facility]) -  CMP14+EGFR - Microalbumin / creatinine urine ratio - CBC with Differential/Platelet  4. Primary osteoarthritis of left knee - CMP14+EGFR - CBC with Differential/Platelet  5. Morbid obesity ([facility]) - CMP14+EGFR - CBC with Differential/Platelet  6. Prostate cancer screening - CMP14+EGFR - PSA, total and free - CBC with Differential/Platelet  7. Nocturia - CMP14+EGFR - PSA, total and free - CBC with Differential/Platelet   Labs pending Health Maintenance reviewed Diet and exercise encouraged  Follow up plan: 6 months   Evelina Dun, FNP

## 2018-08-30 LAB — CMP14+EGFR
ALBUMIN: 4.2 g/dL (ref 3.6–4.8)
ALT: 21 IU/L (ref 0–44)
AST: 20 IU/L (ref 0–40)
Albumin/Globulin Ratio: 1.6 (ref 1.2–2.2)
Alkaline Phosphatase: 72 IU/L (ref 39–117)
BUN / CREAT RATIO: 8 — AB (ref 10–24)
BUN: 11 mg/dL (ref 8–27)
Bilirubin Total: 0.9 mg/dL (ref 0.0–1.2)
CALCIUM: 9.5 mg/dL (ref 8.6–10.2)
CO2: 25 mmol/L (ref 20–29)
Chloride: 101 mmol/L (ref 96–106)
Creatinine, Ser: 1.3 mg/dL — ABNORMAL HIGH (ref 0.76–1.27)
GFR calc Af Amer: 66 mL/min/{1.73_m2} (ref >59)
GFR, EST NON AFRICAN AMERICAN: 57 mL/min/{1.73_m2} — AB (ref >59)
GLOBULIN, TOTAL: 2.7 g/dL (ref 1.5–4.5)
Glucose: 178 mg/dL — ABNORMAL HIGH (ref 65–99)
Potassium: 4.8 mmol/L (ref 3.5–5.2)
SODIUM: 141 mmol/L (ref 134–144)
Total Protein: 6.9 g/dL (ref 6.0–8.5)

## 2018-08-30 LAB — CBC WITH DIFFERENTIAL/PLATELET
BASOS ABS: 0 10*3/uL (ref 0.0–0.2)
Basos: 0 %
EOS (ABSOLUTE): 0.4 10*3/uL (ref 0.0–0.4)
EOS: 6 %
HEMOGLOBIN: 14.6 g/dL (ref 13.0–17.7)
Hematocrit: 41.9 % (ref 37.5–51.0)
IMMATURE GRANS (ABS): 0 10*3/uL (ref 0.0–0.1)
Immature Granulocytes: 0 %
LYMPHS: 25 %
Lymphocytes Absolute: 1.7 10*3/uL (ref 0.7–3.1)
MCH: 30.7 pg (ref 26.6–33.0)
MCHC: 34.8 g/dL (ref 31.5–35.7)
MCV: 88 fL (ref 79–97)
MONOCYTES: 6 %
Monocytes Absolute: 0.4 10*3/uL (ref 0.1–0.9)
NEUTROS ABS: 4.2 10*3/uL (ref 1.4–7.0)
Neutrophils: 63 %
Platelets: 185 10*3/uL (ref 150–450)
RBC: 4.75 x10E6/uL (ref 4.14–5.80)
RDW: 13.1 % (ref 12.3–15.4)
WBC: 6.7 10*3/uL (ref 3.4–10.8)

## 2018-08-30 LAB — LIPID PANEL
CHOL/HDL RATIO: 3.7 ratio (ref 0.0–5.0)
CHOLESTEROL TOTAL: 142 mg/dL (ref 100–199)
HDL: 38 mg/dL — AB (ref >39)
LDL CALC: 80 mg/dL (ref 0–99)
TRIGLYCERIDES: 122 mg/dL (ref 0–149)
VLDL Cholesterol Cal: 24 mg/dL (ref 5–40)

## 2018-08-30 LAB — MICROALBUMIN / CREATININE URINE RATIO
Creatinine, Urine: 157.2 mg/dL
Microalb/Creat Ratio: 8.4 mg/g creat (ref 0.0–30.0)
Microalbumin, Urine: 13.2 ug/mL

## 2018-08-30 LAB — PSA, TOTAL AND FREE
PSA, Free Pct: 41.4 %
PSA, Free: 0.29 ng/mL
Prostate Specific Ag, Serum: 0.7 ng/mL (ref 0.0–4.0)

## 2018-09-02 ENCOUNTER — Other Ambulatory Visit: Payer: Self-pay | Admitting: Family

## 2018-09-02 DIAGNOSIS — N183 Chronic kidney disease, stage 3 unspecified: Secondary | ICD-10-CM | POA: Insufficient documentation

## 2018-09-02 DIAGNOSIS — N189 Chronic kidney disease, unspecified: Secondary | ICD-10-CM | POA: Insufficient documentation

## 2018-09-05 ENCOUNTER — Other Ambulatory Visit: Payer: Self-pay | Admitting: Family

## 2018-09-05 LAB — HGB A1C W/O EAG: HEMOGLOBIN A1C: 7.9 % — AB (ref 4.8–5.6)

## 2018-09-05 LAB — SPECIMEN STATUS REPORT

## 2018-09-05 MED ORDER — METFORMIN HCL 1000 MG PO TABS: 1000.0000 mg | ORAL_TABLET | Freq: Two times a day (BID) | ORAL | 3 refills | Status: DC

## 2018-09-07 ENCOUNTER — Other Ambulatory Visit: Payer: Self-pay | Admitting: Family

## 2018-09-07 DIAGNOSIS — E785 Hyperlipidemia, unspecified: Secondary | ICD-10-CM

## 2018-09-20 LAB — HM DIABETES EYE EXAM

## 2018-10-10 ENCOUNTER — Other Ambulatory Visit: Payer: Self-pay | Admitting: Family

## 2018-10-10 DIAGNOSIS — M1712 Unilateral primary osteoarthritis, left knee: Secondary | ICD-10-CM

## 2018-10-10 DIAGNOSIS — E1165 Type 2 diabetes mellitus with hyperglycemia: Secondary | ICD-10-CM

## 2018-10-10 DIAGNOSIS — I10 Essential (primary) hypertension: Secondary | ICD-10-CM

## 2018-10-11 ENCOUNTER — Other Ambulatory Visit: Payer: Self-pay | Admitting: Family

## 2018-10-11 DIAGNOSIS — N3281 Overactive bladder: Secondary | ICD-10-CM

## 2018-10-18 ENCOUNTER — Other Ambulatory Visit: Payer: Self-pay | Admitting: Family

## 2018-11-18 ENCOUNTER — Other Ambulatory Visit: Payer: Self-pay | Admitting: Family

## 2019-03-03 ENCOUNTER — Other Ambulatory Visit: Payer: Self-pay

## 2019-03-04 ENCOUNTER — Encounter: Payer: Self-pay | Admitting: Family

## 2019-03-04 ENCOUNTER — Ambulatory Visit (INDEPENDENT_AMBULATORY_CARE_PROVIDER_SITE_OTHER): Payer: Medicare Other | Admitting: Family

## 2019-03-04 DIAGNOSIS — E785 Hyperlipidemia, unspecified: Secondary | ICD-10-CM | POA: Diagnosis not present

## 2019-03-04 DIAGNOSIS — I10 Essential (primary) hypertension: Secondary | ICD-10-CM

## 2019-03-04 DIAGNOSIS — R062 Wheezing: Secondary | ICD-10-CM | POA: Diagnosis not present

## 2019-03-04 DIAGNOSIS — M1712 Unilateral primary osteoarthritis, left knee: Secondary | ICD-10-CM | POA: Diagnosis not present

## 2019-03-04 DIAGNOSIS — E1165 Type 2 diabetes mellitus with hyperglycemia: Secondary | ICD-10-CM | POA: Diagnosis not present

## 2019-03-04 DIAGNOSIS — N3281 Overactive bladder: Secondary | ICD-10-CM | POA: Diagnosis not present

## 2019-03-04 DIAGNOSIS — J449 Chronic obstructive pulmonary disease, unspecified: Secondary | ICD-10-CM | POA: Diagnosis not present

## 2019-03-04 MED ORDER — OXYBUTYNIN CHLORIDE ER 10 MG PO TB24: ORAL_TABLET | ORAL | 1 refills | Status: DC

## 2019-03-04 MED ORDER — PRAVASTATIN SODIUM 40 MG PO TABS: ORAL_TABLET | ORAL | 1 refills | Status: DC

## 2019-03-04 MED ORDER — FLUTICASONE PROPIONATE 50 MCG/ACT NA SUSP: 2.0000 | Freq: Every day | NASAL | 6 refills | Status: DC

## 2019-03-04 MED ORDER — TIOTROPIUM BROMIDE MONOHYDRATE 18 MCG IN CAPS: 18.0000 ug | ORAL_CAPSULE | Freq: Every day | RESPIRATORY_TRACT | 12 refills | Status: DC

## 2019-03-04 MED ORDER — LOSARTAN POTASSIUM 100 MG PO TABS: ORAL_TABLET | ORAL | 1 refills | Status: DC

## 2019-03-04 MED ORDER — MELOXICAM 7.5 MG PO TABS: 7.5000 mg | ORAL_TABLET | Freq: Every day | ORAL | 1 refills | Status: DC

## 2019-03-04 MED ORDER — ALBUTEROL SULFATE HFA 108 (90 BASE) MCG/ACT IN AERS: INHALATION_SPRAY | RESPIRATORY_TRACT | 5 refills | Status: DC

## 2019-03-04 MED ORDER — METFORMIN HCL 500 MG PO TABS: 500.0000 mg | ORAL_TABLET | Freq: Two times a day (BID) | ORAL | 0 refills | Status: DC

## 2019-03-04 NOTE — Progress Notes (Signed)
Virtual Visit via telephone Note  I connected with Adam Barrera on 03/04/19 at 8:30 Am by telephone and verified that I am speaking with the correct person using two identifiers. Adam Barrera is currently located at home and no one is currently with her during visit. The provider, Evelina Dun, FNP is located in their office at time of visit.  I discussed the limitations, risks, security and privacy concerns of performing an evaluation and management service by telephone and the availability of in person appointments. I also discussed with the patient that there may be a patient responsible charge related to this service. The patient expressed understanding and agreed to proceed.   History and Present Illness:  Pt calls the office today for chronic follow up. Diabetes  He presents for his follow-up diabetic visit. He has type 2 diabetes mellitus. There are no hypoglycemic associated symptoms. Pertinent negatives for diabetes include no blurred vision, no foot paresthesias and no visual change. Symptoms are stable. Pertinent negatives for diabetic complications include no CVA, heart disease or peripheral neuropathy. Risk factors for coronary artery disease include dyslipidemia, diabetes mellitus, male sex, hypertension and sedentary lifestyle. He is following a generally unhealthy diet. His overall blood glucose range is 110-130 mg/dl. Eye exam is current.  Hypertension  This is a chronic problem. The current episode started more than 1 year ago. The problem has been resolved (100/78) since onset. The problem is controlled. Pertinent negatives include no blurred vision or peripheral edema. Risk factors for coronary artery disease include dyslipidemia, diabetes mellitus, obesity and sedentary lifestyle. The current treatment provides moderate improvement. There is no history of CAD/MI or CVA. There is no history of hyperparathyroidism.  Hyperlipidemia  This is a chronic problem. The current  episode started more than 1 year ago. The problem is controlled. Recent lipid tests were reviewed and are normal. Exacerbating diseases include obesity. Current antihyperlipidemic treatment includes statins. The current treatment provides moderate improvement of lipids. Risk factors for coronary artery disease include dyslipidemia, diabetes mellitus, male sex, hypertension and a sedentary lifestyle.  Arthritis  Presents for follow-up visit. He complains of pain and stiffness. The symptoms have been stable. Affected locations include the right knee and left knee (back). His pain is at a severity of 5/10.  Cough  This is a chronic problem. The current episode started more than 1 year ago. The problem has been waxing and waning. The cough is productive of sputum. Associated symptoms include wheezing. Pertinent negatives include no ear congestion, ear pain, nasal congestion or postnasal drip. He has tried rest for the symptoms.  Urinary Frequency   This is a chronic problem. The current episode started more than 1 year ago. The problem has been waxing and waning. The patient is experiencing no pain. Associated symptoms include frequency.      Review of Systems  HENT: Negative for ear pain and postnasal drip.   Eyes: Negative for blurred vision.  Respiratory: Positive for cough and wheezing.   Genitourinary: Positive for frequency.  Musculoskeletal: Positive for arthritis and stiffness.  All other systems reviewed and are negative.    Observations/Objective: No SOB or distress   Assessment and Plan: [name] comes in today with chief complaint of No chief complaint on file.   Diagnosis and orders addressed:  1. Type 2 diabetes mellitus with hyperglycemia, without long-term current use of insulin ([facility]CC) - metFORMIN (GLUCOPHAGE) 500 MG tablet; Take 1 tablet (500 mg total) by mouth 2 (two) times daily  with a meal.  Dispense: 180 tablet; Refill: 0 - Bayer DCA Hb A1c Waived; Future -  CMP14+EGFR; Future - CBC with Differential/Platelet; Future - Microalbumin / creatinine urine ratio; Future  2. Wheezing - albuterol (VENTOLIN HFA) 108 (90 Base) MCG/ACT inhaler; TAKE 2 PUFFS BY MOUTH EVERY 6 HOURS AS NEEDED FOR WHEEZE OR SHORTNESS OF BREATH  Dispense: 8.5 Inhaler; Refill: 5 - CMP14+EGFR; Future - CBC with Differential/Platelet; Future  3. Essential hypertension, benign - losartan (COZAAR) 100 MG tablet; TAKE 1 TABLET BY MOUTH EVERYDAY AT BEDTIME  Dispense: 90 tablet; Refill: 1 - CMP14+EGFR; Future - CBC with Differential/Platelet; Future  4. Primary osteoarthritis of left knee - meloxicam (MOBIC) 7.5 MG tablet; Take 1 tablet (7.5 mg total) by mouth daily.  Dispense: 180 tablet; Refill: 1 - CMP14+EGFR; Future - CBC with Differential/Platelet; Future  5. OAB (overactive bladder) - oxybutynin (DITROPAN-XL) 10 MG 24 hr tablet; TAKE 1 TABLET BY MOUTH EVERYDAY AT BEDTIME  Dispense: 90 tablet; Refill: 1 - CMP14+EGFR; Future - CBC with Differential/Platelet; Future  6. Hyperlipidemia, unspecified hyperlipidemia type - pravastatin (PRAVACHOL) 40 MG tablet; TAKE 1 TABLET BY MOUTH EVERYDAY AT BEDTIME  Dispense: 90 tablet; Refill: 1 - CMP14+EGFR; Future - CBC with Differential/Platelet; Future  7. Chronic obstructive pulmonary disease, unspecified COPD type (HCC) - albuterol (VENTOLIN HFA) 108 (90 Base) MCG/ACT inhaler; TAKE 2 PUFFS BY MOUTH EVERY 6 HOURS AS NEEDED FOR WHEEZE OR SHORTNESS OF BREATH  Dispense: 8.5 Inhaler; Refill: 5 - tiotropium (SPIRIVA HANDIHALER) 18 MCG inhalation capsule; Place 1 capsule (18 mcg total) into inhaler and inhale daily.  Dispense: 30 capsule; Refill: 12 - CMP14+EGFR; Future - CBC with Differential/Platelet; Future   Labs pending Health Maintenance reviewed Diet and exercise encouraged  Follow up plan: 4 months      I discussed the assessment and treatment plan with the patient. The patient was provided an opportunity to ask  questions and all were answered. The patient agreed with the plan and demonstrated an understanding of the instructions.   The patient was advised to call back or seek an in-person evaluation if the symptoms worsen or if the condition fails to improve as anticipated.  The above assessment and management plan was discussed with the patient. The patient verbalized understanding of and has agreed to the management plan. Patient is aware to call the clinic if symptoms persist or worsen. Patient is aware when to return to the clinic for a follow-up visit. Patient educated on when it is appropriate to go to the emergency department.   Time call ended:  8:52AM  I provided 22 minutes of non-face-to-face time during this encounter.    Evelina Dun, FNP

## 2019-03-06 ENCOUNTER — Other Ambulatory Visit: Payer: Medicare Other

## 2019-03-06 ENCOUNTER — Other Ambulatory Visit: Payer: Self-pay

## 2019-03-06 DIAGNOSIS — E1165 Type 2 diabetes mellitus with hyperglycemia: Secondary | ICD-10-CM

## 2019-03-06 DIAGNOSIS — N3281 Overactive bladder: Secondary | ICD-10-CM

## 2019-03-06 DIAGNOSIS — J449 Chronic obstructive pulmonary disease, unspecified: Secondary | ICD-10-CM | POA: Diagnosis not present

## 2019-03-06 DIAGNOSIS — I10 Essential (primary) hypertension: Secondary | ICD-10-CM

## 2019-03-06 DIAGNOSIS — R062 Wheezing: Secondary | ICD-10-CM

## 2019-03-06 DIAGNOSIS — M1712 Unilateral primary osteoarthritis, left knee: Secondary | ICD-10-CM | POA: Diagnosis not present

## 2019-03-06 DIAGNOSIS — E785 Hyperlipidemia, unspecified: Secondary | ICD-10-CM

## 2019-03-06 LAB — BAYER DCA HB A1C WAIVED: HB A1C (BAYER DCA - WAIVED): 7.2 % — ABNORMAL HIGH (ref <7.0)

## 2019-03-07 LAB — CBC WITH DIFFERENTIAL/PLATELET
Basophils Absolute: 0 10*3/uL (ref 0.0–0.2)
Basos: 1 %
EOS (ABSOLUTE): 0.4 10*3/uL (ref 0.0–0.4)
Eos: 6 %
Hematocrit: 40.1 % (ref 37.5–51.0)
Hemoglobin: 13.8 g/dL (ref 13.0–17.7)
Immature Grans (Abs): 0 10*3/uL (ref 0.0–0.1)
Immature Granulocytes: 0 %
Lymphocytes Absolute: 1.4 10*3/uL (ref 0.7–3.1)
Lymphs: 23 %
MCH: 30.5 pg (ref 26.6–33.0)
MCHC: 34.4 g/dL (ref 31.5–35.7)
MCV: 89 fL (ref 79–97)
Monocytes Absolute: 0.4 10*3/uL (ref 0.1–0.9)
Monocytes: 6 %
Neutrophils Absolute: 3.9 10*3/uL (ref 1.4–7.0)
Neutrophils: 64 %
Platelets: 176 10*3/uL (ref 150–450)
RBC: 4.52 x10E6/uL (ref 4.14–5.80)
RDW: 13.1 % (ref 11.6–15.4)
WBC: 6.1 10*3/uL (ref 3.4–10.8)

## 2019-03-07 LAB — CMP14+EGFR
ALT: 16 IU/L (ref 0–44)
AST: 15 IU/L (ref 0–40)
Albumin/Globulin Ratio: 1.7 (ref 1.2–2.2)
Albumin: 4 g/dL (ref 3.8–4.8)
Alkaline Phosphatase: 60 IU/L (ref 39–117)
BUN/Creatinine Ratio: 9 — ABNORMAL LOW (ref 10–24)
BUN: 11 mg/dL (ref 8–27)
Bilirubin Total: 0.7 mg/dL (ref 0.0–1.2)
CO2: 24 mmol/L (ref 20–29)
Calcium: 9.2 mg/dL (ref 8.6–10.2)
Chloride: 102 mmol/L (ref 96–106)
Creatinine, Ser: 1.29 mg/dL — ABNORMAL HIGH (ref 0.76–1.27)
GFR calc Af Amer: 67 mL/min/{1.73_m2} (ref >59)
GFR calc non Af Amer: 58 mL/min/{1.73_m2} — ABNORMAL LOW (ref >59)
Globulin, Total: 2.4 g/dL (ref 1.5–4.5)
Glucose: 149 mg/dL — ABNORMAL HIGH (ref 65–99)
Potassium: 5 mmol/L (ref 3.5–5.2)
Sodium: 142 mmol/L (ref 134–144)
Total Protein: 6.4 g/dL (ref 6.0–8.5)

## 2019-03-07 LAB — MICROALBUMIN / CREATININE URINE RATIO
Creatinine, Urine: 126.5 mg/dL
Microalb/Creat Ratio: 11 mg/g creat (ref 0–29)
Microalbumin, Urine: 14 ug/mL

## 2019-04-11 ENCOUNTER — Other Ambulatory Visit: Payer: Self-pay | Admitting: Family

## 2019-04-11 DIAGNOSIS — M1712 Unilateral primary osteoarthritis, left knee: Secondary | ICD-10-CM

## 2019-04-13 ENCOUNTER — Other Ambulatory Visit: Payer: Self-pay | Admitting: Family

## 2019-04-15 ENCOUNTER — Telehealth: Payer: Self-pay | Admitting: *Deleted

## 2019-04-15 ENCOUNTER — Encounter: Payer: Self-pay | Admitting: Physical Medicine and Rehabilitation

## 2019-04-15 ENCOUNTER — Other Ambulatory Visit: Payer: Self-pay

## 2019-04-15 ENCOUNTER — Ambulatory Visit (INDEPENDENT_AMBULATORY_CARE_PROVIDER_SITE_OTHER): Payer: Medicare Other | Admitting: Physical Medicine and Rehabilitation

## 2019-04-15 VITALS — BP 124/90 | HR 95 | Ht 70.0 in | Wt 300.0 lb

## 2019-04-15 DIAGNOSIS — M25551 Pain in right hip: Secondary | ICD-10-CM | POA: Diagnosis not present

## 2019-04-15 DIAGNOSIS — G8929 Other chronic pain: Secondary | ICD-10-CM

## 2019-04-15 DIAGNOSIS — M545 Low back pain: Secondary | ICD-10-CM

## 2019-04-15 DIAGNOSIS — M47816 Spondylosis without myelopathy or radiculopathy, lumbar region: Secondary | ICD-10-CM | POA: Diagnosis not present

## 2019-04-15 NOTE — Telephone Encounter (Signed)
Notification or Prior Authorization is not required for the requested services  This [employer] plan does not currently require a prior authorization for [phone]  Decision ID #:[id number]

## 2019-04-15 NOTE — Progress Notes (Signed)
 .  Numeric Pain Rating Scale and Functional Assessment Average Pain 7 Pain Right Now 4 My pain is constant and dull Pain is worse with: inactivity Pain improves with: therapy/exercise   In the last MONTH (on 0-10 scale) has pain interfered with the following?  1. General activity like being  able to carry out your everyday physical activities such as walking, climbing stairs, carrying groceries, or moving a chair?  Rating(8)  2. Relation with others like being able to carry out your usual social activities and roles such as  activities at home, at work and in your community. Rating(8)  3. Enjoyment of life such that you have  been bothered by emotional problems such as feeling anxious, depressed or irritable?  Rating(3)
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
## 2019-05-06 ENCOUNTER — Other Ambulatory Visit: Payer: Self-pay

## 2019-05-06 ENCOUNTER — Ambulatory Visit: Payer: Self-pay

## 2019-05-06 ENCOUNTER — Ambulatory Visit (INDEPENDENT_AMBULATORY_CARE_PROVIDER_SITE_OTHER): Payer: Medicare Other | Admitting: Physical Medicine and Rehabilitation

## 2019-05-06 ENCOUNTER — Encounter: Payer: Self-pay | Admitting: Physical Medicine and Rehabilitation

## 2019-05-06 VITALS — BP 125/80 | HR 70

## 2019-05-06 DIAGNOSIS — M47816 Spondylosis without myelopathy or radiculopathy, lumbar region: Secondary | ICD-10-CM | POA: Diagnosis not present

## 2019-05-06 MED ORDER — METHYLPREDNISOLONE ACETATE 80 MG/ML IJ SUSP
80.0000 mg | Freq: Once | INTRAMUSCULAR | Status: AC
  Administered 2019-05-06: 80 mg

## 2019-05-06 NOTE — Progress Notes (Signed)
 .  Numeric Pain Rating Scale and Functional Assessment Average Pain 5   In the last MONTH (on 0-10 scale) has pain interfered with the following?  1. General activity like being  able to carry out your everyday physical activities such as walking, climbing stairs, carrying groceries, or moving a chair?  Rating(4)   +Driver, -BT, -Dye Allergies.  

## 2019-05-21 ENCOUNTER — Other Ambulatory Visit: Payer: Self-pay

## 2019-05-21 ENCOUNTER — Ambulatory Visit (INDEPENDENT_AMBULATORY_CARE_PROVIDER_SITE_OTHER): Payer: Medicare Other

## 2019-05-21 ENCOUNTER — Encounter: Payer: Self-pay | Admitting: Orthopaedic Surgery

## 2019-05-21 ENCOUNTER — Ambulatory Visit (INDEPENDENT_AMBULATORY_CARE_PROVIDER_SITE_OTHER): Payer: Medicare Other | Admitting: Orthopaedic Surgery

## 2019-05-21 VITALS — BP 111/67 | HR 93 | Ht 69.0 in | Wt 300.0 lb

## 2019-05-21 DIAGNOSIS — M1711 Unilateral primary osteoarthritis, right knee: Secondary | ICD-10-CM | POA: Diagnosis not present

## 2019-05-21 DIAGNOSIS — G8929 Other chronic pain: Secondary | ICD-10-CM | POA: Diagnosis not present

## 2019-05-21 DIAGNOSIS — M25561 Pain in right knee: Secondary | ICD-10-CM

## 2019-05-21 DIAGNOSIS — M179 Osteoarthritis of knee, unspecified: Secondary | ICD-10-CM | POA: Insufficient documentation

## 2019-05-21 MED ORDER — BUPIVACAINE HCL 0.5 % IJ SOLN
2.0000 mL | INTRAMUSCULAR | Status: AC | PRN
  Administered 2019-05-21: 2 mL via INTRA_ARTICULAR

## 2019-05-21 MED ORDER — LIDOCAINE HCL 1 % IJ SOLN
2.0000 mL | INTRAMUSCULAR | Status: AC | PRN
  Administered 2019-05-21: 15:00:00 2 mL

## 2019-05-21 MED ORDER — METHYLPREDNISOLONE ACETATE 40 MG/ML IJ SUSP
80.0000 mg | INTRAMUSCULAR | Status: AC | PRN
  Administered 2019-05-21: 80 mg via INTRA_ARTICULAR
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
## 2019-05-26 ENCOUNTER — Other Ambulatory Visit: Payer: Self-pay | Admitting: Family

## 2019-05-26 DIAGNOSIS — M1712 Unilateral primary osteoarthritis, left knee: Secondary | ICD-10-CM

## 2019-06-04 ENCOUNTER — Other Ambulatory Visit: Payer: Self-pay | Admitting: Family

## 2019-06-04 DIAGNOSIS — M1712 Unilateral primary osteoarthritis, left knee: Secondary | ICD-10-CM

## 2019-06-16 NOTE — Procedures (Signed)
Lumbar Facet Joint Nerve Denervation  Patient: Adam Barrera      Date of Birth: 05/12/1951 MRN: [id number] PCP: Sharion Balloon, FNP      Visit Date: 05/06/2019   Universal Protocol:    Date/Time: 09/21/205:43 AM  Consent Given By: the patient  Position: PRONE  Additional Comments: Vital signs were monitored before and after the procedure. Patient was prepped and draped in the usual sterile fashion. The correct patient, procedure, and site was verified.   Injection Procedure Details:  Procedure Site One Meds Administered:  Meds ordered this encounter  Medications  . methylPREDNISolone acetate (DEPO-MEDROL) injection 80 mg     Laterality: Bilateral  Location/Site:  L4-L5  Needle size: 18 G  Needle type: Radiofrequency cannula  Needle Placement: Along juncture of superior articular process and transverse pocess  Findings:  -Comments:  Procedure Details: For each desired target nerve, the corresponding transverse process (sacral ala for the L5 dorsal rami) was identified and the fluoroscope was positioned to square off the endplates of the corresponding vertebral body to achieve a true AP midline view.  The beam was then obliqued 15 to 20 degrees and caudally tilted 15 to 20 degrees to line up a trajectory along the target nerves. The skin over the target of the junction of superior articulating process and transverse process (sacral ala for the L5 dorsal rami) was infiltrated with 1ml of 1% Lidocaine without Epinephrine.  The 18 gauge 69mm active tip outer cannula was advanced in trajectory view to the target.  This procedure was repeated for each target nerve.  Then, for all levels, the outer cannula placement was fine-tuned and the position was then confirmed with bi-planar imaging.    Test stimulation was done both at sensory and motor levels to ensure there was no radicular stimulation. The target tissues were then infiltrated with 1 ml of 1% Lidocaine without  Epinephrine. Subsequently, a percutaneous neurotomy was carried out for 90 seconds at 80 degrees Celsius.  After the completion of the lesion, 1 ml of injectate was delivered. It was then repeated for each facet joint nerve mentioned above. Appropriate radiographs were obtained to verify the probe placement during the neurotomy.   Additional Comments:  The patient tolerated the procedure well Dressing: 2 x 2 sterile gauze and Band-Aid    Post-procedure details: Patient was observed during the procedure. Post-procedure instructions were reviewed.  Patient left the clinic in stable condition.
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
## 2019-07-29 ENCOUNTER — Ambulatory Visit (INDEPENDENT_AMBULATORY_CARE_PROVIDER_SITE_OTHER): Payer: PPO | Admitting: *Deleted

## 2019-07-29 ENCOUNTER — Ambulatory Visit: Payer: Self-pay | Admitting: Family

## 2019-07-29 DIAGNOSIS — Z0000 Encounter for general adult medical examination without abnormal findings: Secondary | ICD-10-CM | POA: Diagnosis not present

## 2019-07-29 NOTE — Patient Instructions (Signed)
Preventive Care 66 Years and Older, Male Preventive care refers to lifestyle choices and visits with your health care provider that can promote health and wellness. This includes:  A yearly physical exam. This is also called an annual well check.  Regular dental and eye exams.  Immunizations.  Screening for certain conditions.  Healthy lifestyle choices, such as diet and exercise. What can I expect for my preventive care visit? Physical exam Your health care provider will check:  Height and weight. These may be used to calculate body mass index (BMI), which is a measurement that tells if you are at a healthy weight.  Heart rate and blood pressure.  Your skin for abnormal spots. Counseling Your health care provider may ask you questions about:  Alcohol, tobacco, and drug use.  Emotional well-being.  Home and relationship well-being.  Sexual activity.  Eating habits.  History of falls.  Memory and ability to understand (cognition).  Work and work Statistician. What immunizations do I need?  Influenza (flu) vaccine  This is recommended every year. Tetanus, diphtheria, and pertussis (Tdap) vaccine  You may need a Td booster every 10 years. Varicella (chickenpox) vaccine  You may need this vaccine if you have not already been vaccinated. Zoster (shingles) vaccine  You may need this after age 50. Pneumococcal conjugate (PCV13) vaccine  One dose is recommended after age 24. Pneumococcal polysaccharide (PPSV23) vaccine  One dose is recommended after age 33. Measles, mumps, and rubella (MMR) vaccine  You may need at least one dose of MMR if you were born in 1957 or later. You may also need a second dose. Meningococcal conjugate (MenACWY) vaccine  You may need this if you have certain conditions. Hepatitis A vaccine  You may need this if you have certain conditions or if you travel or work in places where you may be exposed to hepatitis A. Hepatitis B vaccine   You may need this if you have certain conditions or if you travel or work in places where you may be exposed to hepatitis B. Haemophilus influenzae type b (Hib) vaccine  You may need this if you have certain conditions. You may receive vaccines as individual doses or as more than one vaccine together in one shot (combination vaccines). Talk with your health care provider about the risks and benefits of combination vaccines. What tests do I need? Blood tests  Lipid and cholesterol levels. These may be checked every 5 years, or more frequently depending on your overall health.  Hepatitis C test.  Hepatitis B test. Screening  Lung cancer screening. You may have this screening every year starting at age 66 if you have a 30-pack-year history of smoking and currently smoke or have quit within the past 15 years.  Colorectal cancer screening. All adults should have this screening starting at age 66 and continuing until age 66. Your health care provider may recommend screening at age 66 if you are at increased risk. You will have tests every 1-10 years, depending on your results and the type of screening test.  Prostate cancer screening. Recommendations will vary depending on your family history and other risks.  Diabetes screening. This is done by checking your blood sugar (glucose) after you have not eaten for a while (fasting). You may have this done every 1-3 years.  Abdominal aortic aneurysm (AAA) screening. You may need this if you are a current or former smoker.  Sexually transmitted disease (STD) testing. Follow these instructions at home: Eating and drinking  Eat  a diet that includes fresh fruits and vegetables, whole grains, lean protein, and low-fat dairy products. Limit your intake of foods with high amounts of sugar, saturated fats, and salt.  Take vitamin and mineral supplements as recommended by your health care provider.  Do not drink alcohol if your health care provider  tells you not to drink.  If you drink alcohol: ? Limit how much you have to 0-2 drinks a day. ? Be aware of how much alcohol is in your drink. In the [place] one drink equals one 12 oz bottle of beer (355 mL), one 5 oz glass of wine (148 mL), or one 1 oz glass of hard liquor (44 mL). Lifestyle  Take daily care of your teeth and gums.  Stay active. Exercise for at least 30 minutes on 5 or more days each week.  Do not use any products that contain nicotine or tobacco, such as cigarettes, e-cigarettes, and chewing tobacco. If you need help quitting, ask your health care provider.  If you are sexually active, practice safe sex. Use a condom or other form of protection to prevent STIs (sexually transmitted infections).  Talk with your health care provider about taking a low-dose aspirin or statin. What's next?  Visit your health care provider once a year for a well check visit.  Ask your health care provider how often you should have your eyes and teeth checked.  Stay up to date on all vaccines. This information is not intended to replace advice given to you by your health care provider. Make sure you discuss any questions you have with your health care provider. Document Released: 10/08/2015 Document Revised: 09/05/2018 Document Reviewed: 09/05/2018 Elsevier Patient Education  2020 [employer]  
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
## 2019-07-31 ENCOUNTER — Ambulatory Visit: Payer: Medicare Other | Admitting: Family

## 2019-08-04 ENCOUNTER — Other Ambulatory Visit: Payer: Self-pay

## 2019-08-05 ENCOUNTER — Ambulatory Visit (INDEPENDENT_AMBULATORY_CARE_PROVIDER_SITE_OTHER): Payer: PPO | Admitting: Family

## 2019-08-05 ENCOUNTER — Encounter: Payer: Self-pay | Admitting: Family

## 2019-08-05 VITALS — BP 119/74 | HR 85 | Temp 97.7°F | Ht 69.0 in | Wt 301.6 lb

## 2019-08-05 DIAGNOSIS — R918 Other nonspecific abnormal finding of lung field: Secondary | ICD-10-CM

## 2019-08-05 DIAGNOSIS — N183 Chronic kidney disease, stage 3 unspecified: Secondary | ICD-10-CM

## 2019-08-05 DIAGNOSIS — Z87891 Personal history of nicotine dependence: Secondary | ICD-10-CM | POA: Diagnosis not present

## 2019-08-05 DIAGNOSIS — I10 Essential (primary) hypertension: Secondary | ICD-10-CM | POA: Diagnosis not present

## 2019-08-05 DIAGNOSIS — Z23 Encounter for immunization: Secondary | ICD-10-CM

## 2019-08-05 DIAGNOSIS — Z77090 Contact with and (suspected) exposure to asbestos: Secondary | ICD-10-CM | POA: Diagnosis not present

## 2019-08-05 DIAGNOSIS — J61 Pneumoconiosis due to asbestos and other mineral fibers: Secondary | ICD-10-CM | POA: Diagnosis not present

## 2019-08-05 DIAGNOSIS — M1712 Unilateral primary osteoarthritis, left knee: Secondary | ICD-10-CM | POA: Diagnosis not present

## 2019-08-05 DIAGNOSIS — J42 Unspecified chronic bronchitis: Secondary | ICD-10-CM

## 2019-08-05 DIAGNOSIS — E1169 Type 2 diabetes mellitus with other specified complication: Secondary | ICD-10-CM

## 2019-08-05 DIAGNOSIS — Z96612 Presence of left artificial shoulder joint: Secondary | ICD-10-CM

## 2019-08-05 DIAGNOSIS — E1159 Type 2 diabetes mellitus with other circulatory complications: Secondary | ICD-10-CM

## 2019-08-05 DIAGNOSIS — E785 Hyperlipidemia, unspecified: Secondary | ICD-10-CM | POA: Diagnosis not present

## 2019-08-05 DIAGNOSIS — E1165 Type 2 diabetes mellitus with hyperglycemia: Secondary | ICD-10-CM | POA: Diagnosis not present

## 2019-08-05 DIAGNOSIS — I152 Hypertension secondary to endocrine disorders: Secondary | ICD-10-CM

## 2019-08-05 LAB — BAYER DCA HB A1C WAIVED: HB A1C (BAYER DCA - WAIVED): 6.9 % (ref <7.0)

## 2019-08-05 MED ORDER — FUROSEMIDE 20 MG PO TABS: 20.0000 mg | ORAL_TABLET | Freq: Every day | ORAL | 3 refills | Status: DC

## 2019-08-05 MED ORDER — BUDESONIDE-FORMOTEROL FUMARATE 80-4.5 MCG/ACT IN AERO: 2.0000 | INHALATION_SPRAY | Freq: Two times a day (BID) | RESPIRATORY_TRACT | 3 refills | Status: DC

## 2019-08-05 NOTE — Patient Instructions (Signed)
Chronic Obstructive Pulmonary Disease °Chronic obstructive pulmonary disease (COPD) is a long-term (chronic) lung problem. When you have COPD, it is hard for air to get in and out of your lungs. Usually the condition gets worse over time, and your lungs will never return to normal. There are things you can do to keep yourself as healthy as possible. °· Your doctor may treat your condition with: °? Medicines. °? Oxygen. °? Lung surgery. °· Your doctor may also recommend: °? Rehabilitation. This includes steps to make your body work better. It may involve a team of specialists. °? Quitting smoking, if you smoke. °? Exercise and changes to your diet. °? Comfort measures (palliative care). °Follow these instructions at home: °Medicines °· Take over-the-counter and prescription medicines only as told by your doctor. °· Talk to your doctor before taking any cough or allergy medicines. You may need to avoid medicines that cause your lungs to be dry. °Lifestyle °· If you smoke, stop. Smoking makes the problem worse. If you need help quitting, ask your doctor. °· Avoid being around things that make your breathing worse. This may include smoke, chemicals, and fumes. °· Stay active, but remember to rest as well. °· Learn and use tips on how to relax. °· Make sure you get enough sleep. Most adults need at least 7 hours of sleep every night. °· Eat healthy foods. Eat smaller meals more often. Rest before meals. °Controlled breathing °Learn and use tips on how to control your breathing as told by your doctor. Try: °· Breathing in (inhaling) through your nose for 1 second. Then, pucker your lips and breath out (exhale) through your lips for 2 seconds. °· Putting one hand on your belly (abdomen). Breathe in slowly through your nose for 1 second. Your hand on your belly should move out. Pucker your lips and breathe out slowly through your lips. Your hand on your belly should move in as you breathe out. ° °Controlled coughing °Learn  and use controlled coughing to clear mucus from your lungs. Follow these steps: °1. Lean your head a little forward. °2. Breathe in deeply. °3. Try to hold your breath for 3 seconds. °4. Keep your mouth slightly open while coughing 2 times. °5. Spit any mucus out into a tissue. °6. Rest and do the steps again 1 or 2 times as needed. °General instructions °· Make sure you get all the shots (vaccines) that your doctor recommends. Ask your doctor about a flu shot and a pneumonia shot. °· Use oxygen therapy and pulmonary rehabilitation if told by your doctor. If you need home oxygen therapy, ask your doctor if you should buy a tool to measure your oxygen level (oximeter). °· Make a COPD action plan with your doctor. This helps you to know what to do if you feel worse than usual. °· Manage any other conditions you have as told by your doctor. °· Avoid going outside when it is very hot, cold, or humid. °· Avoid people who have a sickness you can catch (contagious). °· Keep all follow-up visits as told by your doctor. This is important. °Contact a doctor if: °· You cough up more mucus than usual. °· There is a change in the color or thickness of the mucus. °· It is harder to breathe than usual. °· Your breathing is faster than usual. °· You have trouble sleeping. °· You need to use your medicines more often than usual. °· You have trouble doing your normal activities such as getting dressed   or walking around the house. °Get help right away if: °· You have shortness of breath while resting. °· You have shortness of breath that stops you from: °? Being able to talk. °? Doing normal activities. °· Your chest hurts for longer than 5 minutes. °· Your skin color is more blue than usual. °· Your pulse oximeter shows that you have low oxygen for longer than 5 minutes. °· You have a fever. °· You feel too tired to breathe normally. °Summary °· Chronic obstructive pulmonary disease (COPD) is a long-term lung problem. °· The way your  lungs work will never return to normal. Usually the condition gets worse over time. There are things you can do to keep yourself as healthy as possible. °· Take over-the-counter and prescription medicines only as told by your doctor. °· If you smoke, stop. Smoking makes the problem worse. °This information is not intended to replace advice given to you by your health care provider. Make sure you discuss any questions you have with your health care provider. °Document Released: 02/28/2008 Document Revised: 08/24/2017 Document Reviewed: 10/16/2016 °Elsevier Patient Education © 2020 [employer] ° °

## 2019-08-05 NOTE — Progress Notes (Signed)
Subjective:    Patient ID: Adam Barrera, male    DOB: 01/09/1951, 68 y.o.   MRN: [id number]  Chief Complaint  Patient presents with  . Medical Management of Chronic Issues   Pt presents to the office today for chronic follow up.  Diabetes He presents for his follow-up diabetic visit. He has type 2 diabetes mellitus. His disease course has been stable. Pertinent negatives for hypoglycemia include no confusion or hunger. Pertinent negatives for diabetes include no blurred vision, no foot paresthesias and no visual change. There are no hypoglycemic complications. Symptoms are stable. Diabetic complications include nephropathy. Pertinent negatives for diabetic complications include no heart disease. Risk factors for coronary artery disease include dyslipidemia, diabetes mellitus, male sex, hypertension and sedentary lifestyle. He is following a generally unhealthy diet. His overall blood glucose range is 140-180 mg/dl. Eye exam is current.  Hyperlipidemia This is a chronic problem. The current episode started more than 1 year ago. The problem is controlled. Recent lipid tests were reviewed and are normal. Current antihyperlipidemic treatment includes statins. The current treatment provides moderate improvement of lipids. Risk factors for coronary artery disease include dyslipidemia, diabetes mellitus, male sex, hypertension and a sedentary lifestyle.  Hypertension This is a chronic problem. The current episode started more than 1 year ago. The problem has been resolved since onset. The problem is controlled. Associated symptoms include peripheral edema. Pertinent negatives include no blurred vision, malaise/fatigue or orthopnea. Risk factors for coronary artery disease include dyslipidemia, diabetes mellitus, obesity and sedentary lifestyle. The current treatment provides moderate improvement. There is no history of heart failure.  Arthritis Presents for follow-up visit. He complains of pain and  stiffness. The symptoms have been stable. Affected locations include the right knee and left knee (back). His pain is at a severity of 5/10.  COPD/Asbestos Exposure Pt takes spiriva and albuterol as needed. States his breathing stable.    Review of Systems  Constitutional: Negative for malaise/fatigue.  Eyes: Negative for blurred vision.  Cardiovascular: Negative for orthopnea.  Musculoskeletal: Positive for arthritis and stiffness.  Psychiatric/Behavioral: Negative for confusion.       Objective:   Physical Exam Vitals signs reviewed.  Constitutional:      General: He is not in acute distress.    Appearance: He is well-developed. He is obese.  HENT:     Head: Normocephalic.     Right Ear: External ear normal.     Left Ear: External ear normal.  Eyes:     General:        Right eye: No discharge.        Left eye: No discharge.     Pupils: Pupils are equal, round, and reactive to light.  Neck:     Musculoskeletal: Normal range of motion and neck supple.     Thyroid: No thyromegaly.  Cardiovascular:     Rate and Rhythm: Normal rate and regular rhythm.     Heart sounds: Normal heart sounds. No murmur.  Pulmonary:     Effort: Pulmonary effort is normal. No respiratory distress.     Breath sounds: Wheezing present.  Abdominal:     General: Bowel sounds are normal. There is no distension.     Palpations: Abdomen is soft.     Tenderness: There is no abdominal tenderness.  Musculoskeletal: Normal range of motion.        General: No tenderness.  Skin:    General: Skin is warm and dry.     Findings: No erythema  or rash.  Neurological:     Mental Status: He is alert and oriented to person, place, and time.     Cranial Nerves: No cranial nerve deficit.     Deep Tendon Reflexes: Reflexes are normal and symmetric.  Psychiatric:        Behavior: Behavior normal.        Thought Content: Thought content normal.        Judgment: Judgment normal.       Diabetic Foot Exam -  Simple   Simple Foot Form Diabetic Foot exam was performed with the following findings: Yes 08/05/2019  9:14 AM  Visual Inspection No deformities, no ulcerations, no other skin breakdown bilaterally: Yes Sensation Testing Intact to touch and monofilament testing bilaterally: Yes Pulse Check Posterior Tibialis and Dorsalis pulse intact bilaterally: Yes Comments     BP 119/74   Pulse 85   Temp 97.7 F (36.5 C) (Temporal)   Ht 5' 9" (1.753 m)   Wt (!) 301 lb 9.6 oz (136.8 kg)   SpO2 96%   BMI 44.54 kg/m      Assessment & Plan:  Adam Barrera comes in today with chief complaint of Medical Management of Chronic Issues   Diagnosis and orders addressed:  1. Hypertension associated with diabetes (HCC) - CMP14+EGFR - CBC with Differential/Platelet  2. Hyperlipidemia associated with type 2 diabetes mellitus (HCC) - CMP14+EGFR - CBC with Differential/Platelet  3. Type 2 diabetes mellitus with hyperglycemia, without long-term current use of insulin (HCC) - CMP14+EGFR - CBC with Differential/Platelet - hgba1c  4. Stage 3 chronic kidney disease, unspecified whether stage 3a or 3b CK - CMP14+EGFR - CBC with Differential/Platelet  5. Morbid obesity (HCC) - CMP14+EGFR - CBC with Differential/Platelet  6. Former smoker - CT Chest Wo Contrast; Future - CMP14+EGFR - CBC with Differential/Platelet  7. History of left shoulder replacement - CMP14+EGFR - CBC with Differential/Platelet  8. Primary osteoarthritis of left knee - CMP14+EGFR - CBC with Differential/Platelet  9. Asbestos exposure s evidence asbestosis - CT Chest Wo Contrast; Future - CMP14+EGFR - CBC with Differential/Platelet  10. Multiple pulmonary nodules - CT Chest Wo Contrast; Future - CMP14+EGFR - CBC with Differential/Platelet  11. Chronic bronchitis, unspecified chronic bronchitis type (HCC) - CT Chest Wo Contrast; Future - CMP14+EGFR - CBC with Differential/Platelet - budesonide-formoterol  (SYMBICORT) 80-4.5 MCG/ACT inhaler; Inhale 2 puffs into the lungs 2 (two) times daily.  Dispense: 1 Inhaler; Refill: 3  12. Asbestosis (HCC - CMP14+EGFR - CBC with Differential/Platelet  13. Need for immunization against influenza - Flu Vaccine QUAD High Dose(Fluad)  Will also add lasix today Labs pending Health Maintenance reviewed Diet and exercise encouraged  Follow up plan: 3 months   Christy Hawks, FNP  

## 2019-08-06 LAB — CBC WITH DIFFERENTIAL/PLATELET
Basophils Absolute: 0 10*3/uL (ref 0.0–0.2)
Basos: 1 %
EOS (ABSOLUTE): 0.4 10*3/uL (ref 0.0–0.4)
Eos: 6 %
Hematocrit: 40.4 % (ref 37.5–51.0)
Hemoglobin: 13.6 g/dL (ref 13.0–17.7)
Immature Grans (Abs): 0 10*3/uL (ref 0.0–0.1)
Immature Granulocytes: 0 %
Lymphocytes Absolute: 1.5 10*3/uL (ref 0.7–3.1)
Lymphs: 21 %
MCH: 30.4 pg (ref 26.6–33.0)
MCHC: 33.7 g/dL (ref 31.5–35.7)
MCV: 90 fL (ref 79–97)
Monocytes Absolute: 0.4 10*3/uL (ref 0.1–0.9)
Monocytes: 5 %
Neutrophils Absolute: 4.7 10*3/uL (ref 1.4–7.0)
Neutrophils: 67 %
Platelets: 193 10*3/uL (ref 150–450)
RBC: 4.47 x10E6/uL (ref 4.14–5.80)
RDW: 13.1 % (ref 11.6–15.4)
WBC: 7 10*3/uL (ref 3.4–10.8)

## 2019-08-06 LAB — CMP14+EGFR
ALT: 26 IU/L (ref 0–44)
AST: 22 IU/L (ref 0–40)
Albumin/Globulin Ratio: 1.6 (ref 1.2–2.2)
Albumin: 4.2 g/dL (ref 3.8–4.8)
Alkaline Phosphatase: 67 IU/L (ref 39–117)
BUN/Creatinine Ratio: 9 — ABNORMAL LOW (ref 10–24)
BUN: 11 mg/dL (ref 8–27)
Bilirubin Total: 0.9 mg/dL (ref 0.0–1.2)
CO2: 25 mmol/L (ref 20–29)
Calcium: 9.7 mg/dL (ref 8.6–10.2)
Chloride: 100 mmol/L (ref 96–106)
Creatinine, Ser: 1.22 mg/dL (ref 0.76–1.27)
GFR calc Af Amer: 71 mL/min/{1.73_m2} (ref >59)
GFR calc non Af Amer: 61 mL/min/{1.73_m2} (ref >59)
Globulin, Total: 2.6 g/dL (ref 1.5–4.5)
Glucose: 153 mg/dL — ABNORMAL HIGH (ref 65–99)
Potassium: 4.8 mmol/L (ref 3.5–5.2)
Sodium: 140 mmol/L (ref 134–144)
Total Protein: 6.8 g/dL (ref 6.0–8.5)

## 2019-08-12 ENCOUNTER — Telehealth: Payer: Self-pay | Admitting: Family

## 2019-08-12 NOTE — Telephone Encounter (Signed)
Aware of lab results  

## 2019-08-22 ENCOUNTER — Ambulatory Visit (HOSPITAL_COMMUNITY)
Admission: RE | Admit: 2019-08-22 | Discharge: 2019-08-22 | Disposition: A | Discharge status: Home / Self Care | Payer: PPO | Source: Ambulatory Visit | Attending: Family | Admitting: Family

## 2019-08-22 ENCOUNTER — Other Ambulatory Visit: Payer: Self-pay

## 2019-08-22 ENCOUNTER — Other Ambulatory Visit: Payer: Self-pay | Admitting: Family

## 2019-08-22 DIAGNOSIS — Z77090 Contact with and (suspected) exposure to asbestos: Secondary | ICD-10-CM

## 2019-08-22 DIAGNOSIS — Z87891 Personal history of nicotine dependence: Secondary | ICD-10-CM

## 2019-08-22 DIAGNOSIS — J42 Unspecified chronic bronchitis: Secondary | ICD-10-CM

## 2019-08-22 DIAGNOSIS — R918 Other nonspecific abnormal finding of lung field: Secondary | ICD-10-CM | POA: Insufficient documentation

## 2019-08-26 ENCOUNTER — Other Ambulatory Visit: Payer: Self-pay | Admitting: Family

## 2019-08-26 DIAGNOSIS — K760 Fatty (change of) liver, not elsewhere classified: Secondary | ICD-10-CM | POA: Insufficient documentation

## 2019-08-26 DIAGNOSIS — I700 Atherosclerosis of aorta: Secondary | ICD-10-CM | POA: Insufficient documentation

## 2019-09-12 ENCOUNTER — Other Ambulatory Visit: Payer: Self-pay | Admitting: Family

## 2019-09-12 DIAGNOSIS — E785 Hyperlipidemia, unspecified: Secondary | ICD-10-CM

## 2019-09-20 ENCOUNTER — Other Ambulatory Visit: Payer: Self-pay | Admitting: Family

## 2019-10-02 ENCOUNTER — Other Ambulatory Visit: Payer: Self-pay

## 2019-10-02 ENCOUNTER — Telehealth: Payer: Self-pay | Admitting: Physical Medicine and Rehabilitation

## 2019-10-03 ENCOUNTER — Encounter: Payer: Self-pay | Admitting: Family

## 2019-10-03 ENCOUNTER — Ambulatory Visit (INDEPENDENT_AMBULATORY_CARE_PROVIDER_SITE_OTHER): Payer: PPO | Admitting: Family

## 2019-10-03 VITALS — BP 133/87 | HR 85 | Temp 98.9°F | Ht 69.0 in | Wt 304.0 lb

## 2019-10-03 DIAGNOSIS — I10 Essential (primary) hypertension: Secondary | ICD-10-CM | POA: Diagnosis not present

## 2019-10-03 DIAGNOSIS — N183 Chronic kidney disease, stage 3 unspecified: Secondary | ICD-10-CM | POA: Diagnosis not present

## 2019-10-03 DIAGNOSIS — M1712 Unilateral primary osteoarthritis, left knee: Secondary | ICD-10-CM

## 2019-10-03 DIAGNOSIS — I739 Peripheral vascular disease, unspecified: Secondary | ICD-10-CM | POA: Diagnosis not present

## 2019-10-03 DIAGNOSIS — E1165 Type 2 diabetes mellitus with hyperglycemia: Secondary | ICD-10-CM | POA: Diagnosis not present

## 2019-10-03 DIAGNOSIS — Z96612 Presence of left artificial shoulder joint: Secondary | ICD-10-CM

## 2019-10-03 DIAGNOSIS — I700 Atherosclerosis of aorta: Secondary | ICD-10-CM

## 2019-10-03 DIAGNOSIS — L03116 Cellulitis of left lower limb: Secondary | ICD-10-CM

## 2019-10-03 DIAGNOSIS — E785 Hyperlipidemia, unspecified: Secondary | ICD-10-CM | POA: Diagnosis not present

## 2019-10-03 DIAGNOSIS — E1169 Type 2 diabetes mellitus with other specified complication: Secondary | ICD-10-CM | POA: Diagnosis not present

## 2019-10-03 DIAGNOSIS — I152 Hypertension secondary to endocrine disorders: Secondary | ICD-10-CM

## 2019-10-03 DIAGNOSIS — E1159 Type 2 diabetes mellitus with other circulatory complications: Secondary | ICD-10-CM | POA: Diagnosis not present

## 2019-10-03 LAB — BAYER DCA HB A1C WAIVED: HB A1C (BAYER DCA - WAIVED): 7.2 % — ABNORMAL HIGH (ref <7.0)

## 2019-10-03 MED ORDER — CEPHALEXIN 500 MG PO CAPS: 500.0000 mg | ORAL_CAPSULE | Freq: Three times a day (TID) | ORAL | 0 refills | Status: DC

## 2019-10-03 NOTE — Patient Instructions (Signed)

## 2019-10-03 NOTE — Telephone Encounter (Signed)
04/2019 needs 6 months before insurance coverage, usually last longer - I would Eval with possible L5-S1 interlam epidural

## 2019-10-03 NOTE — Progress Notes (Signed)
Subjective:    Patient ID: Adam Barrera, male    DOB: 1951-03-05, 69 y.o.   MRN: [id number]  Chief Complaint  Patient presents with  . Medical Management of Chronic Issues  . Diabetes    Pt presents to the office today for chronic follow up.   He is complaining of discoloration of left lower leg. He states he has noticed this discoloration over the last 4 months. He reports intermittent achy pain  of 5 out 10 and the area is itchy. Denies any fever, heat, bites, or injury. He feels like over the last month he has noticed increase pain and tingling. He is worried because he is a diabetic.   He takes lasix 20 mg daily. Denies any weight changes.  Diabetes He presents for his follow-up diabetic visit. He has type 2 diabetes mellitus. His disease course has been stable. There are no hypoglycemic associated symptoms. Pertinent negatives for diabetes include no blurred vision and no foot paresthesias. Symptoms are stable. Risk factors for coronary artery disease include dyslipidemia, male sex, hypertension and sedentary lifestyle. He is following a generally unhealthy diet. His overall blood glucose range is 130-140 mg/dl. Eye exam is not current.  Hypertension This is a chronic problem. The current episode started more than 1 year ago. The problem has been resolved since onset. The problem is controlled. Pertinent negatives include no blurred vision. Risk factors for coronary artery disease include dyslipidemia, diabetes mellitus, obesity, male gender and sedentary lifestyle. The current treatment provides moderate improvement. There is no history of kidney disease or heart failure.  Hyperlipidemia This is a chronic problem. The current episode started more than 1 year ago. The problem is controlled. Recent lipid tests were reviewed and are normal. Exacerbating diseases include obesity. Current antihyperlipidemic treatment includes statins. The current treatment provides moderate improvement of  lipids.  Arthritis Presents for follow-up visit. He complains of pain. The symptoms have been stable. Affected locations include the right knee and left knee. His pain is at a severity of 5/10.      Review of Systems  Eyes: Negative for blurred vision.  Musculoskeletal: Positive for arthritis.  All other systems reviewed and are negative.      Objective:   Physical Exam Vitals reviewed.  Constitutional:      General: He is not in acute distress.    Appearance: He is well-developed. He is obese.  HENT:     Head: Normocephalic.     Right Ear: Tympanic membrane normal.     Left Ear: Tympanic membrane normal.  Eyes:     General:        Right eye: No discharge.        Left eye: No discharge.     Pupils: Pupils are equal, round, and reactive to light.  Neck:     Thyroid: No thyromegaly.  Cardiovascular:     Rate and Rhythm: Normal rate and regular rhythm.     Heart sounds: Normal heart sounds. No murmur.  Pulmonary:     Effort: Pulmonary effort is normal. No respiratory distress.     Breath sounds: Normal breath sounds. No wheezing.  Abdominal:     General: Bowel sounds are normal. There is no distension.     Palpations: Abdomen is soft.     Tenderness: There is no abdominal tenderness.  Musculoskeletal:        General: No tenderness. Normal range of motion.     Cervical back: Normal range of motion and  neck supple.     Left lower leg: Edema (trace) present.     Comments: Left lower leg mildly erythemas, warm, and mild tenderness.   Skin:    General: Skin is warm and dry.     Findings: No erythema or rash.  Neurological:     Mental Status: He is alert and oriented to person, place, and time.     Cranial Nerves: No cranial nerve deficit.     Deep Tendon Reflexes: Reflexes are normal and symmetric.  Psychiatric:        Behavior: Behavior normal.        Thought Content: Thought content normal.        Judgment: Judgment normal.       BP 133/87   Pulse 85   Temp  98.9 F (37.2 C) (Temporal)   Ht '5\' 9"'  (1.753 m)   Wt (!) 304 lb (137.9 kg)   SpO2 96%   BMI 44.89 kg/m      Assessment & Plan:  Adam Barrera comes in today with chief complaint of Medical Management of Chronic Issues and Diabetes   Diagnosis and orders addressed:  1. Hypertension associated with diabetes ([place]) - CMP14+EGFR - CBC with Differential/Platelet  2. Aortic atherosclerosis (HCC) - CMP14+EGFR - CBC with Differential/Platelet  3. Hyperlipidemia associated with type 2 diabetes mellitus (HCC) - CMP14+EGFR - CBC with Differential/Platelet  4. Type 2 diabetes mellitus with hyperglycemia, without long-term current use of insulin (HCC) - hgba1c - CMP14+EGFR - CBC with Differential/Platelet  5. Primary osteoarthritis of left knee - CMP14+EGFR - CBC with Differential/Platelet  6. Stage 3 chronic kidney disease, unspecified whether stage 3a or 3b CKD - CMP14+EGFR - CBC with Differential/Platelet  7. Morbid obesity ([place]) - CMP14+EGFR - CBC with Differential/Platelet  8. History of left shoulder replacement - CMP14+EGFR - CBC with Differential/Platelet  9. Cellulitis of left lower extremity Start wearing compression hose daily Keep legs elevated when possible Continue lasix daily Encouraged weight loss and healthy diet RTO if symptoms worsen or do not improve  - CMP14+EGFR - CBC with Differential/Platelet - cephALEXin (KEFLEX) 500 MG capsule; Take 1 capsule (500 mg total) by mouth 3 (three) times daily.  Dispense: 21 capsule; Refill: 0  10. PAD (peripheral artery disease) ([place]) - CMP14+EGFR - CBC with Differential/Platelet   Labs pending Health Maintenance reviewed Diet and exercise encouraged  Follow up plan: 3 months   Evelina Dun, FNP

## 2019-10-03 NOTE — Telephone Encounter (Signed)
Scheduled for 1/21 at 1000 with driver and no blood thinners.

## 2019-10-04 LAB — CMP14+EGFR
ALT: 23 IU/L (ref 0–44)
AST: 20 IU/L (ref 0–40)
Albumin/Globulin Ratio: 2 (ref 1.2–2.2)
Albumin: 4.5 g/dL (ref 3.8–4.8)
Alkaline Phosphatase: 69 IU/L (ref 39–117)
BUN/Creatinine Ratio: 9 — ABNORMAL LOW (ref 10–24)
BUN: 11 mg/dL (ref 8–27)
Bilirubin Total: 0.8 mg/dL (ref 0.0–1.2)
CO2: 27 mmol/L (ref 20–29)
Calcium: 9.3 mg/dL (ref 8.6–10.2)
Chloride: 102 mmol/L (ref 96–106)
Creatinine, Ser: 1.27 mg/dL (ref 0.76–1.27)
GFR calc Af Amer: 68 mL/min/{1.73_m2} (ref >59)
GFR calc non Af Amer: 58 mL/min/{1.73_m2} — ABNORMAL LOW (ref >59)
Globulin, Total: 2.2 g/dL (ref 1.5–4.5)
Glucose: 163 mg/dL — ABNORMAL HIGH (ref 65–99)
Potassium: 4.7 mmol/L (ref 3.5–5.2)
Sodium: 142 mmol/L (ref 134–144)
Total Protein: 6.7 g/dL (ref 6.0–8.5)

## 2019-10-04 LAB — CBC WITH DIFFERENTIAL/PLATELET
Basophils Absolute: 0 10*3/uL (ref 0.0–0.2)
Basos: 1 %
EOS (ABSOLUTE): 0.3 10*3/uL (ref 0.0–0.4)
Eos: 5 %
Hematocrit: 40.6 % (ref 37.5–51.0)
Hemoglobin: 13.9 g/dL (ref 13.0–17.7)
Immature Grans (Abs): 0 10*3/uL (ref 0.0–0.1)
Immature Granulocytes: 0 %
Lymphocytes Absolute: 1.5 10*3/uL (ref 0.7–3.1)
Lymphs: 24 %
MCH: 31 pg (ref 26.6–33.0)
MCHC: 34.2 g/dL (ref 31.5–35.7)
MCV: 91 fL (ref 79–97)
Monocytes Absolute: 0.4 10*3/uL (ref 0.1–0.9)
Monocytes: 6 %
Neutrophils Absolute: 3.9 10*3/uL (ref 1.4–7.0)
Neutrophils: 64 %
Platelets: 188 10*3/uL (ref 150–450)
RBC: 4.48 x10E6/uL (ref 4.14–5.80)
RDW: 13 % (ref 11.6–15.4)
WBC: 6.1 10*3/uL (ref 3.4–10.8)

## 2019-10-12 ENCOUNTER — Other Ambulatory Visit: Payer: Self-pay | Admitting: Family

## 2019-10-12 DIAGNOSIS — N3281 Overactive bladder: Secondary | ICD-10-CM

## 2019-10-16 ENCOUNTER — Ambulatory Visit (INDEPENDENT_AMBULATORY_CARE_PROVIDER_SITE_OTHER): Payer: PPO | Admitting: Physical Medicine and Rehabilitation

## 2019-10-16 ENCOUNTER — Encounter: Payer: Self-pay | Admitting: Physical Medicine and Rehabilitation

## 2019-10-16 ENCOUNTER — Ambulatory Visit: Payer: Self-pay

## 2019-10-16 ENCOUNTER — Other Ambulatory Visit: Payer: Self-pay

## 2019-10-16 VITALS — BP 118/81 | HR 81 | Ht 70.0 in | Wt 300.0 lb

## 2019-10-16 DIAGNOSIS — M5416 Radiculopathy, lumbar region: Secondary | ICD-10-CM | POA: Diagnosis not present

## 2019-10-16 DIAGNOSIS — M5116 Intervertebral disc disorders with radiculopathy, lumbar region: Secondary | ICD-10-CM

## 2019-10-16 MED ORDER — METHYLPREDNISOLONE ACETATE 80 MG/ML IJ SUSP
40.0000 mg | Freq: Once | INTRAMUSCULAR | Status: AC
  Administered 2019-10-16: 10:00:00 40 mg

## 2019-10-16 NOTE — Progress Notes (Signed)
Pt states pain in the lower back mostly on the right side that radiates into the right buttocks and the back of the right thigh. Pt states RFA August 2020 helped and pain returned in the past couple of months. Pt states standing makes pain worse, sitting helps with pain.   .Numeric Pain Rating Scale and Functional Assessment Average Pain 6 Pain Right Now 5 My pain is intermittent and aching Pain is worse with: walking and standing Pain improves with: rest   In the last MONTH (on 0-10 scale) has pain interfered with the following?  1. General activity like being  able to carry out your everyday physical activities such as walking, climbing stairs, carrying groceries, or moving a chair?  Rating(7)  2. Relation with others like being able to carry out your usual social activities and roles such as  activities at home, at work and in your community. Rating(7)  3. Enjoyment of life such that you have  been bothered by emotional problems such as feeling anxious, depressed or irritable?  Rating(3)

## 2019-10-22 ENCOUNTER — Other Ambulatory Visit: Payer: Self-pay | Admitting: Family

## 2019-10-27 ENCOUNTER — Other Ambulatory Visit: Payer: Self-pay | Admitting: Family

## 2019-10-27 DIAGNOSIS — N3281 Overactive bladder: Secondary | ICD-10-CM

## 2019-10-27 DIAGNOSIS — M1712 Unilateral primary osteoarthritis, left knee: Secondary | ICD-10-CM

## 2019-10-27 DIAGNOSIS — I10 Essential (primary) hypertension: Secondary | ICD-10-CM

## 2019-11-24 ENCOUNTER — Other Ambulatory Visit: Payer: Self-pay | Admitting: Family

## 2019-11-24 DIAGNOSIS — J42 Unspecified chronic bronchitis: Secondary | ICD-10-CM

## 2019-12-01 DIAGNOSIS — H524 Presbyopia: Secondary | ICD-10-CM | POA: Diagnosis not present

## 2019-12-01 LAB — HM DIABETES EYE EXAM

## 2019-12-04 DIAGNOSIS — Z1159 Encounter for screening for other viral diseases: Secondary | ICD-10-CM | POA: Diagnosis not present

## 2019-12-08 ENCOUNTER — Telehealth: Payer: Self-pay | Admitting: Family

## 2019-12-08 NOTE — Chronic Care Management (AMB) (Signed)
  Chronic Care Management   Note  12/08/2019 Name: Adam Barrera MRN: [id number] DOB: 03-25-1951  [name] is a 69 y.o. year old male who is a primary care patient of Sharion Balloon, FNP. I reached out to [facility] by phone today in response to a referral sent by [name]'s health plan.     [name] was given information about Chronic Care Management services today including:  1. CCM service includes personalized support from designated clinical staff supervised by his physician, including individualized plan of care and coordination with other care providers 2. 24/7 contact phone numbers for assistance for urgent and routine care needs. 3. Service will only be billed when office clinical staff spend 20 minutes or more in a month to coordinate care. 4. Only one practitioner may furnish and bill the service in a calendar month. 5. The patient may stop CCM services at any time (effective at the end of the month) by phone call to the office staff. 6. The patient will be responsible for cost sharing (co-pay) of up to 20% of the service fee (after annual deductible is met).  Patient agreed to services and verbal consent obtained.   Follow up plan: Telephone appointment with care management team member scheduled for:03/25/2020  Noreene Larsson, [place], [place],  [address] Direct Dial: [phone] [email or web address] Website: [email or web address]

## 2019-12-09 DIAGNOSIS — Z800 Family history of malignant neoplasm of digestive organs: Secondary | ICD-10-CM | POA: Diagnosis not present

## 2019-12-09 DIAGNOSIS — D120 Benign neoplasm of cecum: Secondary | ICD-10-CM | POA: Diagnosis not present

## 2019-12-09 DIAGNOSIS — Z86010 Personal history of colonic polyps: Secondary | ICD-10-CM | POA: Diagnosis not present

## 2019-12-12 DIAGNOSIS — D120 Benign neoplasm of cecum: Secondary | ICD-10-CM | POA: Diagnosis not present

## 2019-12-12 DIAGNOSIS — Z1159 Encounter for screening for other viral diseases: Secondary | ICD-10-CM | POA: Diagnosis not present

## 2019-12-27 ENCOUNTER — Other Ambulatory Visit: Payer: Self-pay | Admitting: Family

## 2019-12-27 DIAGNOSIS — M1712 Unilateral primary osteoarthritis, left knee: Secondary | ICD-10-CM

## 2019-12-27 DIAGNOSIS — E785 Hyperlipidemia, unspecified: Secondary | ICD-10-CM

## 2020-01-01 ENCOUNTER — Encounter: Payer: Self-pay | Admitting: Family

## 2020-01-01 ENCOUNTER — Ambulatory Visit (INDEPENDENT_AMBULATORY_CARE_PROVIDER_SITE_OTHER): Payer: PPO | Admitting: Family

## 2020-01-01 ENCOUNTER — Other Ambulatory Visit: Payer: Self-pay

## 2020-01-01 VITALS — BP 108/73 | HR 79 | Temp 97.7°F | Ht 70.0 in | Wt 295.8 lb

## 2020-01-01 DIAGNOSIS — E785 Hyperlipidemia, unspecified: Secondary | ICD-10-CM | POA: Diagnosis not present

## 2020-01-01 DIAGNOSIS — J42 Unspecified chronic bronchitis: Secondary | ICD-10-CM | POA: Diagnosis not present

## 2020-01-01 DIAGNOSIS — I10 Essential (primary) hypertension: Secondary | ICD-10-CM | POA: Diagnosis not present

## 2020-01-01 DIAGNOSIS — N183 Chronic kidney disease, stage 3 unspecified: Secondary | ICD-10-CM

## 2020-01-01 DIAGNOSIS — E1159 Type 2 diabetes mellitus with other circulatory complications: Secondary | ICD-10-CM

## 2020-01-01 DIAGNOSIS — J449 Chronic obstructive pulmonary disease, unspecified: Secondary | ICD-10-CM | POA: Diagnosis not present

## 2020-01-01 DIAGNOSIS — E1169 Type 2 diabetes mellitus with other specified complication: Secondary | ICD-10-CM | POA: Diagnosis not present

## 2020-01-01 DIAGNOSIS — Z0000 Encounter for general adult medical examination without abnormal findings: Secondary | ICD-10-CM

## 2020-01-01 DIAGNOSIS — E1165 Type 2 diabetes mellitus with hyperglycemia: Secondary | ICD-10-CM

## 2020-01-01 DIAGNOSIS — M1712 Unilateral primary osteoarthritis, left knee: Secondary | ICD-10-CM

## 2020-01-01 DIAGNOSIS — Z0001 Encounter for general adult medical examination with abnormal findings: Secondary | ICD-10-CM | POA: Diagnosis not present

## 2020-01-01 DIAGNOSIS — I700 Atherosclerosis of aorta: Secondary | ICD-10-CM

## 2020-01-01 DIAGNOSIS — I739 Peripheral vascular disease, unspecified: Secondary | ICD-10-CM

## 2020-01-01 DIAGNOSIS — I152 Hypertension secondary to endocrine disorders: Secondary | ICD-10-CM

## 2020-01-01 DIAGNOSIS — N3281 Overactive bladder: Secondary | ICD-10-CM

## 2020-01-01 DIAGNOSIS — R062 Wheezing: Secondary | ICD-10-CM | POA: Diagnosis not present

## 2020-01-01 LAB — BAYER DCA HB A1C WAIVED: HB A1C (BAYER DCA - WAIVED): 7.1 % — ABNORMAL HIGH (ref <7.0)

## 2020-01-01 MED ORDER — BUDESONIDE-FORMOTEROL FUMARATE 80-4.5 MCG/ACT IN AERO: INHALATION_SPRAY | RESPIRATORY_TRACT | 3 refills | Status: DC

## 2020-01-01 MED ORDER — SPIRIVA HANDIHALER 18 MCG IN CAPS: 18.0000 ug | ORAL_CAPSULE | Freq: Every day | RESPIRATORY_TRACT | 12 refills | Status: DC

## 2020-01-01 MED ORDER — GLUCOSE BLOOD VI STRP: ORAL_STRIP | 3 refills | Status: DC

## 2020-01-01 MED ORDER — ALBUTEROL SULFATE HFA 108 (90 BASE) MCG/ACT IN AERS: INHALATION_SPRAY | RESPIRATORY_TRACT | 2 refills | Status: DC

## 2020-01-01 MED ORDER — METFORMIN HCL 1000 MG PO TABS: 1000.0000 mg | ORAL_TABLET | Freq: Two times a day (BID) | ORAL | 3 refills | Status: DC

## 2020-01-01 MED ORDER — LOSARTAN POTASSIUM 100 MG PO TABS: 100.0000 mg | ORAL_TABLET | Freq: Every day | ORAL | 1 refills | Status: DC

## 2020-01-01 MED ORDER — MELOXICAM 7.5 MG PO TABS: 15.0000 mg | ORAL_TABLET | Freq: Every day | ORAL | 11 refills | Status: DC

## 2020-01-01 MED ORDER — BLOOD GLUCOSE MONITOR SYSTEM W/DEVICE KIT: PACK | 2 refills | Status: AC

## 2020-01-01 MED ORDER — LANCETS MISC: 3 refills | Status: DC

## 2020-01-01 MED ORDER — FLUTICASONE PROPIONATE 50 MCG/ACT NA SUSP: 2.0000 | Freq: Every day | NASAL | 6 refills | Status: DC

## 2020-01-01 MED ORDER — PRAVASTATIN SODIUM 40 MG PO TABS: 40.0000 mg | ORAL_TABLET | Freq: Every day | ORAL | 3 refills | Status: DC

## 2020-01-01 MED ORDER — OXYBUTYNIN CHLORIDE ER 10 MG PO TB24: 10.0000 mg | ORAL_TABLET | Freq: Every day | ORAL | 3 refills | Status: DC

## 2020-01-01 NOTE — Progress Notes (Signed)
Subjective:    Patient ID: Adam Barrera, male    DOB: Jan 22, 1951, 69 y.o.   MRN: [id number]  Chief Complaint  Patient presents with  . Medical Management of Chronic Issues    burning sensation in ankle    Pt presents to the office today for CPE.  He continues to have left ankle and leg swelling with a "tiningling and burning" pain that comes and goes. He has compression hose that he wore for 2 weeks, but did not see any improvement.  Diabetes He presents for his follow-up diabetic visit. He has type 2 diabetes mellitus. His disease course has been stable. There are no hypoglycemic associated symptoms. Pertinent negatives for diabetes include no blurred vision and no foot paresthesias. There are no hypoglycemic complications. Symptoms are stable. Diabetic complications include heart disease and nephropathy. Risk factors for coronary artery disease include diabetes mellitus, hypertension, male sex, dyslipidemia and family history. He is following a generally unhealthy diet. His overall blood glucose range is 140-180 mg/dl.  Hyperlipidemia This is a chronic problem. The current episode started more than 1 year ago. The problem is controlled. Recent lipid tests were reviewed and are normal. Exacerbating diseases include obesity. Pertinent negatives include no shortness of breath. Current antihyperlipidemic treatment includes statins. The current treatment provides moderate improvement of lipids.  Hypertension This is a chronic problem. The current episode started more than 1 year ago. The problem has been resolved since onset. The problem is controlled. Associated symptoms include malaise/fatigue and peripheral edema. Pertinent negatives include no blurred vision or shortness of breath. Risk factors for coronary artery disease include dyslipidemia, diabetes mellitus, obesity, male gender and sedentary lifestyle. The current treatment provides moderate improvement.  Arthritis Presents for follow-up  visit. He complains of pain. The symptoms have been stable. Affected locations include the right knee and left knee.      Review of Systems  Constitutional: Positive for malaise/fatigue.  Eyes: Negative for blurred vision.  Respiratory: Negative for shortness of breath.   Musculoskeletal: Positive for arthritis.  All other systems reviewed and are negative.      Family History  Problem Relation Age of Onset  . Prostate cancer Other        family history   . Prostate cancer Father   . Colon cancer Father   . Cancer Father   . Hypertension Father   . Diabetes Mother   . Stroke Mother   . Alzheimer's disease Mother   . Hypertension Mother   . Stomach cancer Brother 45  . Cancer Brother   . Colon cancer Brother 57   Social History   Socioeconomic History  . Marital status: Married    Spouse name: Adam Barrera  . Number of children: 2  . Years of education: 81  . Highest education level: Associate degree: occupational, Hotel manager, or vocational program  Occupational History  . Occupation: Retired from [employer]  . Smoking status: Former Smoker    Packs/day: 1.00    Years: 30.00    Pack years: 30.00    Types: Cigarettes    Quit date: 07/29/2015    Years since quitting: 4.4  . Smokeless tobacco: Never Used  Substance and Sexual Activity  . Alcohol use: No  . Drug use: No  . Sexual activity: Yes    Birth control/protection: None  Other Topics Concern  . Not on file  Social History Narrative  . Not on file   Social Determinants of Health  Financial Resource Strain:   . Difficulty of Paying Living Expenses:   Food Insecurity:   . Worried About Charity fundraiser in the Last Year:   . Arboriculturist in the Last Year:   Transportation Needs:   . Film/video editor (Medical):   Marland Kitchen Lack of Transportation (Non-Medical):   Physical Activity:   . Days of Exercise per Week:   . Minutes of Exercise per Session:   Stress: No Stress Concern Present  .  Feeling of Stress : Not at all  Social Connections:   . Frequency of Communication with Friends and Family:   . Frequency of Social Gatherings with Friends and Family:   . Attends Religious Services:   . Active Member of Clubs or Organizations:   . Attends Archivist Meetings:   Marland Kitchen Marital Status:     Objective:   Physical Exam Vitals reviewed.  Constitutional:      General: He is not in acute distress.    Appearance: He is well-developed. He is obese.  HENT:     Head: Normocephalic.     Right Ear: Tympanic membrane normal.     Left Ear: Tympanic membrane normal.  Eyes:     General:        Right eye: No discharge.        Left eye: No discharge.     Pupils: Pupils are equal, round, and reactive to light.  Neck:     Thyroid: No thyromegaly.  Cardiovascular:     Rate and Rhythm: Normal rate and regular rhythm.     Heart sounds: Normal heart sounds. No murmur.  Pulmonary:     Effort: Pulmonary effort is normal. No respiratory distress.     Breath sounds: Normal breath sounds. No wheezing.  Abdominal:     General: Bowel sounds are normal. There is no distension.     Palpations: Abdomen is soft.     Tenderness: There is no abdominal tenderness.  Musculoskeletal:        General: No tenderness. Normal range of motion.     Cervical back: Normal range of motion and neck supple.     Left lower leg: Edema present.     Comments: Left lower leg swelling and discoloration of skin   Skin:    General: Skin is warm and dry.     Findings: No erythema or rash.  Neurological:     Mental Status: He is alert and oriented to person, place, and time.     Cranial Nerves: No cranial nerve deficit.     Deep Tendon Reflexes: Reflexes are normal and symmetric.  Psychiatric:        Behavior: Behavior normal.        Thought Content: Thought content normal.        Judgment: Judgment normal.     BP 108/73   Pulse 79   Temp 97.7 F (36.5 C) (Temporal)   Ht '5\' 10"'  (1.778 m)   Wt  295 lb 12.8 oz (134.2 kg)   SpO2 95%   BMI 42.44 kg/m      Assessment & Plan:  Adam Barrera comes in today with chief complaint of Medical Management of Chronic Issues (burning sensation in ankle )   Diagnosis and orders addressed:  1. Chronic bronchitis, unspecified chronic bronchitis type (HCC) - budesonide-formoterol (SYMBICORT) 80-4.5 MCG/ACT inhaler; TAKE 2 PUFFS BY MOUTH TWICE A DAY  Dispense: 10.2 Inhaler; Refill: 3 - CMP14+EGFR - CBC with  Differential/Platelet  2. Chronic obstructive pulmonary disease, unspecified COPD type ([place]) - tiotropium (SPIRIVA HANDIHALER) 18 MCG inhalation capsule; Place 1 capsule (18 mcg total) into inhaler and inhale daily.  Dispense: 30 capsule; Refill: 12 - albuterol (VENTOLIN HFA) 108 (90 Base) MCG/ACT inhaler; TAKE 2 PUFFS BY MOUTH EVERY 6 HOURS AS NEEDED FOR WHEEZE OR SHORTNESS OF BREATH  Dispense: 18 g; Refill: 2 - CMP14+EGFR - CBC with Differential/Platelet  3. Wheezing - albuterol (VENTOLIN HFA) 108 (90 Base) MCG/ACT inhaler; TAKE 2 PUFFS BY MOUTH EVERY 6 HOURS AS NEEDED FOR WHEEZE OR SHORTNESS OF BREATH  Dispense: 18 g; Refill: 2 - CMP14+EGFR - CBC with Differential/Platelet  4. Hyperlipidemia, unspecified hyperlipidemia type - pravastatin (PRAVACHOL) 40 MG tablet; Take 1 tablet (40 mg total) by mouth daily.  Dispense: 90 tablet; Refill: 3 - CMP14+EGFR - CBC with Differential/Platelet - Lipid panel  5. Essential hypertension, benign - losartan (COZAAR) 100 MG tablet; Take 1 tablet (100 mg total) by mouth daily.  Dispense: 90 tablet; Refill: 1 - CMP14+EGFR - CBC with Differential/Platelet  6. OAB (overactive bladder) - oxybutynin (DITROPAN-XL) 10 MG 24 hr tablet; Take 1 tablet (10 mg total) by mouth at bedtime.  Dispense: 90 tablet; Refill: 3 - CMP14+EGFR - CBC with Differential/Platelet  7. Primary osteoarthritis of left knee - meloxicam (MOBIC) 7.5 MG tablet; Take 2 tablets (15 mg total) by mouth daily.  Dispense: 60 tablet;  Refill: 11 - CMP14+EGFR - CBC with Differential/Platelet  8. Aortic atherosclerosis (HCC) - CMP14+EGFR - CBC with Differential/Platelet  9. Hypertension associated with diabetes ([place]) - CMP14+EGFR - CBC with Differential/Platelet  10. Type 2 diabetes mellitus with hyperglycemia, without long-term current use of insulin (HCC) - Bayer DCA Hb A1c Waived - CMP14+EGFR - CBC with Differential/Platelet  11. Hyperlipidemia associated with type 2 diabetes mellitus (HCC) - CMP14+EGFR - CBC with Differential/Platelet  12. Stage 3 chronic kidney disease, unspecified whether stage 3a or 3b CKD - CMP14+EGFR - CBC with Differential/Platelet  13. Morbid obesity ([place]) - CMP14+EGFR - CBC with Differential/Platelet  14. PAD (peripheral artery disease) ([place]) - Ambulatory referral to Vascular Surgery - CMP14+EGFR - CBC with Differential/Platelet  15. Annual physical exam - Bayer DCA Hb A1c Waived - CMP14+EGFR - CBC with Differential/Platelet - Lipid panel - PSA, total and free - TSH   Labs pending Health Maintenance reviewed Diet and exercise encouraged  Follow up plan: 4 months    Evelina Dun, FNP

## 2020-01-01 NOTE — Patient Instructions (Signed)

## 2020-01-02 ENCOUNTER — Other Ambulatory Visit: Payer: Self-pay | Admitting: Family

## 2020-01-02 LAB — CBC WITH DIFFERENTIAL/PLATELET
Basophils Absolute: 0 10*3/uL (ref 0.0–0.2)
Basos: 0 %
EOS (ABSOLUTE): 0.5 10*3/uL — ABNORMAL HIGH (ref 0.0–0.4)
Eos: 8 %
Hematocrit: 40.1 % (ref 37.5–51.0)
Hemoglobin: 13.4 g/dL (ref 13.0–17.7)
Immature Grans (Abs): 0 10*3/uL (ref 0.0–0.1)
Immature Granulocytes: 0 %
Lymphocytes Absolute: 1.7 10*3/uL (ref 0.7–3.1)
Lymphs: 25 %
MCH: 30.2 pg (ref 26.6–33.0)
MCHC: 33.4 g/dL (ref 31.5–35.7)
MCV: 90 fL (ref 79–97)
Monocytes Absolute: 0.4 10*3/uL (ref 0.1–0.9)
Monocytes: 6 %
Neutrophils Absolute: 4.1 10*3/uL (ref 1.4–7.0)
Neutrophils: 61 %
Platelets: 188 10*3/uL (ref 150–450)
RBC: 4.44 x10E6/uL (ref 4.14–5.80)
RDW: 13.5 % (ref 11.6–15.4)
WBC: 6.7 10*3/uL (ref 3.4–10.8)

## 2020-01-02 LAB — TSH: TSH: 2.07 u[IU]/mL (ref 0.450–4.500)

## 2020-01-02 LAB — CMP14+EGFR
ALT: 18 IU/L (ref 0–44)
AST: 20 IU/L (ref 0–40)
Albumin/Globulin Ratio: 1.7 (ref 1.2–2.2)
Albumin: 4.3 g/dL (ref 3.8–4.8)
Alkaline Phosphatase: 59 IU/L (ref 39–117)
BUN/Creatinine Ratio: 9 — ABNORMAL LOW (ref 10–24)
BUN: 13 mg/dL (ref 8–27)
Bilirubin Total: 1.1 mg/dL (ref 0.0–1.2)
CO2: 24 mmol/L (ref 20–29)
Calcium: 9.6 mg/dL (ref 8.6–10.2)
Chloride: 101 mmol/L (ref 96–106)
Creatinine, Ser: 1.45 mg/dL — ABNORMAL HIGH (ref 0.76–1.27)
GFR calc Af Amer: 58 mL/min/{1.73_m2} — ABNORMAL LOW (ref >59)
GFR calc non Af Amer: 50 mL/min/{1.73_m2} — ABNORMAL LOW (ref >59)
Globulin, Total: 2.5 g/dL (ref 1.5–4.5)
Glucose: 169 mg/dL — ABNORMAL HIGH (ref 65–99)
Potassium: 4.8 mmol/L (ref 3.5–5.2)
Sodium: 140 mmol/L (ref 134–144)
Total Protein: 6.8 g/dL (ref 6.0–8.5)

## 2020-01-02 LAB — PSA, TOTAL AND FREE
PSA, Free Pct: 34.4 %
PSA, Free: 0.31 ng/mL
Prostate Specific Ag, Serum: 0.9 ng/mL (ref 0.0–4.0)

## 2020-01-02 LAB — LIPID PANEL
Chol/HDL Ratio: 4 ratio (ref 0.0–5.0)
Cholesterol, Total: 133 mg/dL (ref 100–199)
HDL: 33 mg/dL — ABNORMAL LOW (ref >39)
LDL Chol Calc (NIH): 78 mg/dL (ref 0–99)
Triglycerides: 123 mg/dL (ref 0–149)
VLDL Cholesterol Cal: 22 mg/dL (ref 5–40)

## 2020-02-03 NOTE — Procedures (Signed)
Lumbar Epidural Steroid Injection - Interlaminar Approach with Fluoroscopic Guidance  Patient: Adam Barrera      Date of Birth: March 15, 1951 MRN: [id number] PCP: Sharion Balloon, FNP      Visit Date: 10/16/2019   Universal Protocol:     Consent Given By: the patient  Position: PRONE  Additional Comments: Vital signs were monitored before and after the procedure. Patient was prepped and draped in the usual sterile fashion. The correct patient, procedure, and site was verified.   Injection Procedure Details:  Procedure Site One Meds Administered:  Meds ordered this encounter  Medications  . methylPREDNISolone acetate (DEPO-MEDROL) injection 40 mg     Laterality: Right  Location/Site:  L5-S1  Needle size: 20 G  Needle type: Tuohy  Needle Placement: Paramedian epidural  Findings:   -Comments: Excellent flow of contrast into the epidural space.  Procedure Details: Using a paramedian approach from the side mentioned above, the region overlying the inferior lamina was localized under fluoroscopic visualization and the soft tissues overlying this structure were infiltrated with 4 ml. of 1% Lidocaine without Epinephrine. The Tuohy needle was inserted into the epidural space using a paramedian approach.   The epidural space was localized using loss of resistance along with lateral and bi-planar fluoroscopic views.  After negative aspirate for air, blood, and CSF, a 2 ml. volume of Isovue-250 was injected into the epidural space and the flow of contrast was observed. Radiographs were obtained for documentation purposes.    The injectate was administered into the level noted above.   Additional Comments:  The patient tolerated the procedure well Dressing: 2 x 2 sterile gauze and Band-Aid    Post-procedure details: Patient was observed during the procedure. Post-procedure instructions were reviewed.  Patient left the clinic in stable condition.

## 2020-02-03 NOTE — Progress Notes (Signed)
Adam Barrera - 69 y.o. male MRN [id number]  Date of birth: [birth year]  Office Visit Note: Visit Date: 10/16/2019 PCP: Sharion Balloon, FNP Referred by: Sharion Balloon, FNP  Subjective: Chief Complaint  Patient presents with  . Lower Back - Pain  . Right Thigh - Pain   HPI:  Adam Barrera is a 69 y.o. male who comes in today For evaluation and management of chronic low back pain with some referral into the right hip and thigh.  His history with [place] is that he has had radiofrequency ablation of the lumbar spine on a couple of occasions with good relief.  We repeated the ablation at L4-5 in August 2020.  He did get good relief but now is having this right buttock type pain.  Last MRI was in 2016.  His case is complicated by diabetes and cardiovascular disease and obesity.  At this point I think the best approach is epidural injection do to the severity of his symptoms.  He reports this is an intermittent aching pain with walking and standing and it just seems like it is probably going to be stenosis.  He did not have that in 2016 however.  He has no focal weakness or red flag complaints.  Depending on results with injection we will look at updating MRI of the lumbar spine.  ROS Otherwise per HPI.  Assessment & Plan: Visit Diagnoses:  1. Lumbar radiculopathy   2. Radiculopathy due to lumbar intervertebral disc disorder     Plan: No additional findings.   Meds & Orders:  Meds ordered this encounter  Medications  . methylPREDNISolone acetate (DEPO-MEDROL) injection 40 mg    Orders Placed This Encounter  Procedures  . XR C-ARM NO REPORT  . Epidural Steroid injection    Follow-up: Return for Consider new lumbar spine MRI.   Procedures: No procedures performed  Lumbar Epidural Steroid Injection - Interlaminar Approach with Fluoroscopic Guidance  Patient: Adam Barrera      Date of Birth: 1950/09/27 MRN: [id number] PCP: Sharion Balloon, FNP      Visit Date: 10/16/2019     Universal Protocol:     Consent Given By: the patient  Position: PRONE  Additional Comments: Vital signs were monitored before and after the procedure. Patient was prepped and draped in the usual sterile fashion. The correct patient, procedure, and site was verified.   Injection Procedure Details:  Procedure Site One Meds Administered:  Meds ordered this encounter  Medications  . methylPREDNISolone acetate (DEPO-MEDROL) injection 40 mg     Laterality: Right  Location/Site:  L5-S1  Needle size: 20 G  Needle type: Tuohy  Needle Placement: Paramedian epidural  Findings:   -Comments: Excellent flow of contrast into the epidural space.  Procedure Details: Using a paramedian approach from the side mentioned above, the region overlying the inferior lamina was localized under fluoroscopic visualization and the soft tissues overlying this structure were infiltrated with 4 ml. of 1% Lidocaine without Epinephrine. The Tuohy needle was inserted into the epidural space using a paramedian approach.   The epidural space was localized using loss of resistance along with lateral and bi-planar fluoroscopic views.  After negative aspirate for air, blood, and CSF, a 2 ml. volume of Isovue-250 was injected into the epidural space and the flow of contrast was observed. Radiographs were obtained for documentation purposes.    The injectate was administered into the level noted above.   Additional Comments:  The patient tolerated the  procedure well Dressing: 2 x 2 sterile gauze and Band-Aid    Post-procedure details: Patient was observed during the procedure. Post-procedure instructions were reviewed.  Patient left the clinic in stable condition.    Clinical History: MRI LUMBAR SPINE WITHOUT CONTRAST July/2012   Technique: Multiplanar and multiecho pulse sequences of the lumbar  spine were obtained without intravenous contrast.    Comparison: None.    Findings: Five lumbar  type vertebral bodies are assumed. There is  a 2 mm in degenerative anterolisthesis of L4 on L5. The alignment  is otherwise normal. The lumbar pedicles are relatively short on a  congenital basis. There is no evidence of acute fracture or pars  defect.    The conus medullaris extends to the L1-L2 level and appears normal.  There are no paraspinal abnormalities.    There are no significant disc space findings from T11-T12 through  L2-L3.    L3-L4: Mild disc bulge with mild facet and ligamentous  hypertrophy. No spinal stenosis or nerve root encroachment.    L4-L5: There is annular disc bulging with a shallow left  paracentral disc protrusion. Moderate facet degenerative changes  are present bilaterally accounting for the slight anterolisthesis.  These factors contribute to mild central, left greater than right  lateral recess and right greater than left foraminal stenosis.    L5-S1: There is disc bulging with mild bilateral facet  hypertrophy. No significant resulting spinal stenosis or nerve  root encroachment results.    IMPRESSION:    1. Mild multifactorial spinal stenosis at L4-L5 with lateral  recess and foraminal stenosis bilaterally.  2. Mild disc bulging and facet disease at L3-L4 and L5-S1 without  resulting spinal stenosis or nerve root encroachment.     Objective:  VS:  HT:5\' 10"  (177.8 cm)   WT:300 lb (136.1 kg)  BMI:43.05    BP:118/81  HR:81bpm  TEMP: ( )  RESP:  Physical Exam Constitutional:      General: He is not in acute distress.    Appearance: Normal appearance. He is not ill-appearing.  HENT:     Head: Normocephalic and atraumatic.     Right Ear: External ear normal.     Left Ear: External ear normal.  Eyes:     Extraocular Movements: Extraocular movements intact.  Cardiovascular:     Rate and Rhythm: Normal rate.     Pulses: Normal pulses.  Abdominal:     General: There is no distension.     Palpations: Abdomen is soft.    Musculoskeletal:        General: No tenderness or signs of injury.     Right lower leg: No edema.     Left lower leg: No edema.     Comments: Patient has good distal strength without clonus.  Skin:    Findings: No erythema or rash.  Neurological:     General: No focal deficit present.     Mental Status: He is alert and oriented to person, place, and time.     Sensory: No sensory deficit.     Motor: No weakness or abnormal muscle tone.     Coordination: Coordination normal.  Psychiatric:        Mood and Affect: Mood normal.        Behavior: Behavior normal.      Imaging: No results found.

## 2020-02-09 ENCOUNTER — Other Ambulatory Visit: Payer: Self-pay | Admitting: *Deleted

## 2020-02-09 DIAGNOSIS — I739 Peripheral vascular disease, unspecified: Secondary | ICD-10-CM

## 2020-02-11 ENCOUNTER — Telehealth (HOSPITAL_COMMUNITY): Payer: Self-pay

## 2020-02-11 NOTE — Telephone Encounter (Signed)

## 2020-02-12 ENCOUNTER — Other Ambulatory Visit: Payer: Self-pay

## 2020-02-12 ENCOUNTER — Ambulatory Visit (HOSPITAL_COMMUNITY)
Admission: RE | Admit: 2020-02-12 | Discharge: 2020-02-12 | Disposition: A | Discharge status: Home / Self Care | Payer: PPO | Source: Ambulatory Visit | Attending: Vascular Surgery | Admitting: Vascular Surgery

## 2020-02-12 ENCOUNTER — Ambulatory Visit (INDEPENDENT_AMBULATORY_CARE_PROVIDER_SITE_OTHER): Payer: PPO | Admitting: Vascular Surgery

## 2020-02-12 ENCOUNTER — Encounter: Payer: Self-pay | Admitting: Vascular Surgery

## 2020-02-12 VITALS — BP 117/76 | HR 71 | Temp 97.9°F | Resp 20 | Ht 70.0 in | Wt 299.0 lb

## 2020-02-12 DIAGNOSIS — I739 Peripheral vascular disease, unspecified: Secondary | ICD-10-CM | POA: Diagnosis not present

## 2020-02-12 DIAGNOSIS — I87302 Chronic venous hypertension (idiopathic) without complications of left lower extremity: Secondary | ICD-10-CM | POA: Diagnosis not present
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
## 2020-02-14 ENCOUNTER — Other Ambulatory Visit: Payer: Self-pay | Admitting: Family

## 2020-03-25 ENCOUNTER — Ambulatory Visit: Payer: PPO | Admitting: *Deleted

## 2020-03-25 DIAGNOSIS — E1169 Type 2 diabetes mellitus with other specified complication: Secondary | ICD-10-CM

## 2020-03-25 DIAGNOSIS — E1165 Type 2 diabetes mellitus with hyperglycemia: Secondary | ICD-10-CM

## 2020-03-25 NOTE — Chronic Care Management (AMB) (Signed)
  Chronic Care Management   Note  03/25/2020 Name: Adam Barrera MRN: [id number] DOB: September 06, 1951  [name] was referred to the embedded CCM program by his healthplan for assistance with care management and care coordination related to HTN, DM, HLD, OA, asbestosis.   I spoke with [name] by telephone today. He is able to perform all ADLs and IADLs independently and he does not have any CCM resource or education needs at this time. He appreciated the outreach but would like to be removed from the program. He will reach out in the future if the need arises.    Follow up plan: CCM enrollment status changed to "previously enrolled" as per patient request on 03/25/20 to discontinue enrollment. Case closed to case management services in primary care home.   Chong Sicilian, BSN, RN-BC Embedded Chronic Care Manager [facility] Direct Dial: [phone]

## 2020-04-06 ENCOUNTER — Ambulatory Visit (INDEPENDENT_AMBULATORY_CARE_PROVIDER_SITE_OTHER): Payer: PPO | Admitting: Family

## 2020-04-06 ENCOUNTER — Other Ambulatory Visit: Payer: Self-pay

## 2020-04-06 ENCOUNTER — Encounter: Payer: Self-pay | Admitting: Family

## 2020-04-06 VITALS — BP 119/79 | HR 83 | Temp 98.6°F | Ht 70.0 in | Wt 295.0 lb

## 2020-04-06 DIAGNOSIS — N3281 Overactive bladder: Secondary | ICD-10-CM

## 2020-04-06 DIAGNOSIS — K760 Fatty (change of) liver, not elsewhere classified: Secondary | ICD-10-CM | POA: Diagnosis not present

## 2020-04-06 DIAGNOSIS — N183 Chronic kidney disease, stage 3 unspecified: Secondary | ICD-10-CM | POA: Diagnosis not present

## 2020-04-06 DIAGNOSIS — E785 Hyperlipidemia, unspecified: Secondary | ICD-10-CM | POA: Diagnosis not present

## 2020-04-06 DIAGNOSIS — E1165 Type 2 diabetes mellitus with hyperglycemia: Secondary | ICD-10-CM

## 2020-04-06 DIAGNOSIS — I700 Atherosclerosis of aorta: Secondary | ICD-10-CM | POA: Diagnosis not present

## 2020-04-06 DIAGNOSIS — Z87891 Personal history of nicotine dependence: Secondary | ICD-10-CM | POA: Diagnosis not present

## 2020-04-06 DIAGNOSIS — I152 Hypertension secondary to endocrine disorders: Secondary | ICD-10-CM

## 2020-04-06 DIAGNOSIS — E1159 Type 2 diabetes mellitus with other circulatory complications: Secondary | ICD-10-CM

## 2020-04-06 DIAGNOSIS — E1169 Type 2 diabetes mellitus with other specified complication: Secondary | ICD-10-CM

## 2020-04-06 DIAGNOSIS — I10 Essential (primary) hypertension: Secondary | ICD-10-CM | POA: Diagnosis not present

## 2020-04-06 LAB — BAYER DCA HB A1C WAIVED: HB A1C (BAYER DCA - WAIVED): 7.2 % — ABNORMAL HIGH (ref <7.0)

## 2020-04-06 NOTE — Progress Notes (Signed)
Subjective:    Patient ID: Adam Barrera, male    DOB: [birth year], 69 y.o.   MRN: [id number]  Chief Complaint  Patient presents with  . Medical Management of Chronic Issues  . Hypertension  . Diabetes   Pt presents to the office today for chronic follow up.   Hypertension This is a chronic problem. The current episode started more than 1 year ago. The problem has been resolved since onset. The problem is controlled. Associated symptoms include peripheral edema. Pertinent negatives include no blurred vision, malaise/fatigue or shortness of breath. Risk factors for coronary artery disease include diabetes mellitus, dyslipidemia, obesity, male gender and sedentary lifestyle. The current treatment provides moderate improvement. There is no history of CVA or heart failure.  Diabetes He presents for his follow-up diabetic visit. He has type 2 diabetes mellitus. His disease course has been stable. There are no hypoglycemic associated symptoms. There are no diabetic associated symptoms. Pertinent negatives for diabetes include no blurred vision and no foot paresthesias. Symptoms are stable. Pertinent negatives for diabetic complications include no CVA or heart disease. Risk factors for coronary artery disease include dyslipidemia, diabetes mellitus, male sex, hypertension and sedentary lifestyle. He is following a generally unhealthy diet. His overall blood glucose range is 130-140 mg/dl. An ACE inhibitor/angiotensin II receptor blocker is being taken. Eye exam is current.  Hyperlipidemia This is a chronic problem. The current episode started more than 1 year ago. The problem is controlled. Recent lipid tests were reviewed and are normal. Exacerbating diseases include obesity. Pertinent negatives include no shortness of breath. Current antihyperlipidemic treatment includes statins. The current treatment provides moderate improvement of lipids. Risk factors for coronary artery disease include dyslipidemia,  diabetes mellitus, male sex, hypertension and a sedentary lifestyle.  Arthritis Presents for follow-up visit. He complains of pain and stiffness. The symptoms have been stable. Affected location: back. His pain is at a severity of 8/10.  Urinary Frequency  This is a chronic problem. The current episode started more than 1 year ago. The problem has been waxing and waning. Associated symptoms include frequency. Treatments tried: oxybutynin       Review of Systems  Constitutional: Negative for malaise/fatigue.  Eyes: Negative for blurred vision.  Respiratory: Negative for shortness of breath.   Genitourinary: Positive for frequency.  Musculoskeletal: Positive for arthritis and stiffness.  All other systems reviewed and are negative.      Objective:   Physical Exam Vitals reviewed.  Constitutional:      General: He is not in acute distress.    Appearance: He is well-developed.  HENT:     Head: Normocephalic.     Right Ear: Tympanic membrane normal.     Left Ear: Tympanic membrane normal.  Eyes:     General:        Right eye: No discharge.        Left eye: No discharge.     Pupils: Pupils are equal, round, and reactive to light.  Neck:     Thyroid: No thyromegaly.  Cardiovascular:     Rate and Rhythm: Normal rate and regular rhythm.     Heart sounds: Normal heart sounds. No murmur heard.   Pulmonary:     Effort: Pulmonary effort is normal. No respiratory distress.     Breath sounds: Normal breath sounds. No wheezing.  Abdominal:     General: Bowel sounds are normal. There is no distension.     Palpations: Abdomen is soft.     Tenderness:  There is no abdominal tenderness.  Musculoskeletal:        General: No tenderness. Normal range of motion.     Cervical back: Normal range of motion and neck supple.     Left lower leg: Edema ( trace) present.  Skin:    General: Skin is warm and dry.     Findings: No erythema or rash.  Neurological:     Mental Status: He is alert and  oriented to person, place, and time.     Cranial Nerves: No cranial nerve deficit.     Deep Tendon Reflexes: Reflexes are normal and symmetric.  Psychiatric:        Behavior: Behavior normal.        Thought Content: Thought content normal.        Judgment: Judgment normal.       BP 119/79   Pulse 83   Temp 98.6 F (37 C)   Ht '5\' 10"'  (1.778 m)   Wt 295 lb (133.8 kg)   SpO2 96%   BMI 42.33 kg/m      Assessment & Plan:  Adam Barrera comes in today with chief complaint of Medical Management of Chronic Issues, Hypertension, and Diabetes   Diagnosis and orders addressed:  1. Type 2 diabetes mellitus with hyperglycemia, without long-term current use of insulin (HCC) - Bayer DCA Hb A1c Waived - BMP8+EGFR  2. Aortic atherosclerosis (HCC) - BMP8+EGFR  3. Hypertension associated with diabetes ([place]) - BMP8+EGFR  4. Hyperlipidemia associated with type 2 diabetes mellitus (HCC) - BMP8+EGFR - Hepatic function panel  5. Stage 3 chronic kidney disease, unspecified whether stage 3a or 3b CKD - BMP8+EGFR  6. Former smoker - BMP8+EGFR  7. Morbid obesity ([place]) - BMP8+EGFR  8. Hepatic steatosis - BMP8+EGFR - Hepatic function panel  9. OAB (overactive bladder) - BMP8+EGFR   Labs pending Health Maintenance reviewed Diet and exercise encouraged  Follow up plan: 6 months    Evelina Dun, FNP

## 2020-04-06 NOTE — Patient Instructions (Signed)

## 2020-04-07 LAB — BMP8+EGFR
BUN/Creatinine Ratio: 10 (ref 10–24)
BUN: 13 mg/dL (ref 8–27)
CO2: 22 mmol/L (ref 20–29)
Calcium: 9.3 mg/dL (ref 8.6–10.2)
Chloride: 104 mmol/L (ref 96–106)
Creatinine, Ser: 1.31 mg/dL — ABNORMAL HIGH (ref 0.76–1.27)
GFR calc Af Amer: 65 mL/min/{1.73_m2} (ref >59)
GFR calc non Af Amer: 56 mL/min/{1.73_m2} — ABNORMAL LOW (ref >59)
Glucose: 160 mg/dL — ABNORMAL HIGH (ref 65–99)
Potassium: 4.4 mmol/L (ref 3.5–5.2)
Sodium: 140 mmol/L (ref 134–144)

## 2020-04-07 LAB — HEPATIC FUNCTION PANEL
ALT: 20 IU/L (ref 0–44)
AST: 16 IU/L (ref 0–40)
Albumin: 4 g/dL (ref 3.8–4.8)
Alkaline Phosphatase: 63 IU/L (ref 48–121)
Bilirubin Total: 0.8 mg/dL (ref 0.0–1.2)
Bilirubin, Direct: 0.23 mg/dL (ref 0.00–0.40)
Total Protein: 6.7 g/dL (ref 6.0–8.5)

## 2020-04-08 ENCOUNTER — Other Ambulatory Visit: Payer: Self-pay | Admitting: Family

## 2020-04-12 ENCOUNTER — Telehealth: Payer: Self-pay | Admitting: Family

## 2020-04-12 NOTE — Telephone Encounter (Signed)
Pt returning call to the office for lab results.

## 2020-04-12 NOTE — Telephone Encounter (Signed)
Aware of results and recommendations  

## 2020-04-16 ENCOUNTER — Other Ambulatory Visit: Payer: Self-pay | Admitting: Family

## 2020-04-26 ENCOUNTER — Telehealth: Payer: Self-pay | Admitting: Physical Medicine and Rehabilitation

## 2020-04-26 NOTE — Telephone Encounter (Signed)
Right L5-S1 IL 10/16/19. Ok to repeat if helped, same problem/side, and no new injury?

## 2020-04-26 NOTE — Telephone Encounter (Signed)
Patient called needing to schedule an appointment with Dr Ernestina Patches. The number to contact patient is [phone]

## 2020-04-27 NOTE — Telephone Encounter (Signed)
ok 

## 2020-04-27 NOTE — Telephone Encounter (Signed)
Patient would like repeat of bilateral L4-5 RFA. Ok to try for auth?

## 2020-04-27 NOTE — Telephone Encounter (Signed)
Yes see if auth ok

## 2020-04-27 NOTE — Telephone Encounter (Signed)
Needs auth and scheduling for [phone]. Since it is one level, we can do both sided during same 60 minute appointment.

## 2020-04-29 ENCOUNTER — Telehealth: Payer: Self-pay | Admitting: Physical Medicine and Rehabilitation

## 2020-04-29 NOTE — Telephone Encounter (Signed)
Patient called concerning his appointment with Dr Ernestina Patches. Patient asked for a call back on # [phone]

## 2020-04-30 DIAGNOSIS — Z20822 Contact with and (suspected) exposure to covid-19: Secondary | ICD-10-CM | POA: Diagnosis not present

## 2020-05-04 NOTE — Telephone Encounter (Signed)
Please advise 

## 2020-05-05 ENCOUNTER — Telehealth: Payer: Self-pay | Admitting: Physical Medicine and Rehabilitation

## 2020-05-05 NOTE — Telephone Encounter (Signed)
Pt called stating this is his third call and someone had discussed set up an injection for him then never called back to set it; pt no longer wants an injection he would like to get his nerves blocked; pt would really appreciate a CB to set this up.   [phone] Pt states if it's easier for [place] we can call him in the afternoon

## 2020-05-06 NOTE — Telephone Encounter (Signed)
Patient is scheduled for 9/2 per Dr. Ernestina Patches. We still need to try to get auth.

## 2020-05-06 NOTE — Telephone Encounter (Signed)
Per Dr. Ernestina Patches, patient is scheduled for 9/2 at 54.

## 2020-05-13 ENCOUNTER — Other Ambulatory Visit: Payer: Self-pay | Admitting: Family

## 2020-05-13 DIAGNOSIS — M1712 Unilateral primary osteoarthritis, left knee: Secondary | ICD-10-CM

## 2020-05-13 NOTE — Telephone Encounter (Signed)
Pt insurance web site down, I will try to call today.

## 2020-05-13 NOTE — Telephone Encounter (Signed)
PT AUTH# [id number]/ [id number] NOT REQUIRED.

## 2020-05-27 ENCOUNTER — Other Ambulatory Visit: Payer: Self-pay

## 2020-05-27 ENCOUNTER — Encounter: Payer: Self-pay | Admitting: Physical Medicine and Rehabilitation

## 2020-05-27 ENCOUNTER — Ambulatory Visit: Payer: PPO | Admitting: Physical Medicine and Rehabilitation

## 2020-05-27 ENCOUNTER — Ambulatory Visit: Payer: Self-pay

## 2020-05-27 VITALS — BP 125/77 | HR 101

## 2020-05-27 DIAGNOSIS — M47816 Spondylosis without myelopathy or radiculopathy, lumbar region: Secondary | ICD-10-CM | POA: Diagnosis not present

## 2020-05-27 DIAGNOSIS — M545 Low back pain, unspecified: Secondary | ICD-10-CM

## 2020-05-27 MED ORDER — METHYLPREDNISOLONE ACETATE 80 MG/ML IJ SUSP
80.0000 mg | Freq: Once | INTRAMUSCULAR | Status: AC
  Administered 2020-05-27: 80 mg

## 2020-05-27 NOTE — Progress Notes (Signed)
Pt state lower back pain. pt state bending makes the pain worse. Pt states sitting helps a little with pain. Pt has hx of inj on 10/16/19 pt state that it helped but it didn't last long.  Numeric Pain Rating Scale and Functional Assessment Average Pain 7   In the last MONTH (on 0-10 scale) has pain interfered with the following?  1. General activity like being  able to carry out your everyday physical activities such as walking, climbing stairs, carrying groceries, or moving a chair?  Rating(8)   +Driver, -BT, -Dye Allergies.
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
## 2020-05-28 NOTE — Procedures (Signed)
Lumbar Facet Joint Nerve Denervation  Patient: Adam Barrera      Date of Birth: [birth year] MRN: [id number] PCP: Sharion Balloon, FNP      Visit Date: 05/27/2020   Universal Protocol:    Date/Time: 09/03/215:58 AM  Consent Given By: the patient  Position: PRONE  Additional Comments: Vital signs were monitored before and after the procedure. Patient was prepped and draped in the usual sterile fashion. The correct patient, procedure, and site was verified.   Injection Procedure Details:  Procedure Site One Meds Administered:  Meds ordered this encounter  Medications  . methylPREDNISolone acetate (DEPO-MEDROL) injection 80 mg     Laterality: Bilateral  Location/Site:  L4-L5  Needle size: 18 G  Needle type: Radiofrequency cannula  Needle Placement: Along juncture of superior articular process and transverse pocess  Findings:  -Comments:  Procedure Details: For each desired target nerve, the corresponding transverse process (sacral ala for the L5 dorsal rami) was identified and the fluoroscope was positioned to square off the endplates of the corresponding vertebral body to achieve a true AP midline view.  The beam was then obliqued 15 to 20 degrees and caudally tilted 15 to 20 degrees to line up a trajectory along the target nerves. The skin over the target of the junction of superior articulating process and transverse process (sacral ala for the L5 dorsal rami) was infiltrated with 49ml of 1% Lidocaine without Epinephrine.  The 18 gauge 50mm active tip outer cannula was advanced in trajectory view to the target.  This procedure was repeated for each target nerve.  Then, for all levels, the outer cannula placement was fine-tuned and the position was then confirmed with bi-planar imaging.    Test stimulation was done both at sensory and motor levels to ensure there was no radicular stimulation. The target tissues were then infiltrated with 1 ml of 1% Lidocaine without  Epinephrine. Subsequently, a percutaneous neurotomy was carried out for 90 seconds at 80 degrees Celsius.  After the completion of the lesion, 1 ml of injectate was delivered. It was then repeated for each facet joint nerve mentioned above. Appropriate radiographs were obtained to verify the probe placement during the neurotomy.   Additional Comments:  The patient tolerated the procedure well Dressing: 2 x 2 sterile gauze and Band-Aid    Post-procedure details: Patient was observed during the procedure. Post-procedure instructions were reviewed.  Patient left the clinic in stable condition.

## 2020-06-01 ENCOUNTER — Other Ambulatory Visit: Payer: Self-pay | Admitting: Family

## 2020-06-01 DIAGNOSIS — I10 Essential (primary) hypertension: Secondary | ICD-10-CM

## 2020-07-29 ENCOUNTER — Ambulatory Visit (INDEPENDENT_AMBULATORY_CARE_PROVIDER_SITE_OTHER): Payer: PPO | Admitting: *Deleted

## 2020-07-29 VITALS — BP 119/79 | Ht 70.0 in | Wt 295.0 lb

## 2020-07-29 DIAGNOSIS — Z0000 Encounter for general adult medical examination without abnormal findings: Secondary | ICD-10-CM
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
## 2020-08-03 ENCOUNTER — Ambulatory Visit (INDEPENDENT_AMBULATORY_CARE_PROVIDER_SITE_OTHER): Payer: PPO | Admitting: *Deleted

## 2020-08-03 ENCOUNTER — Other Ambulatory Visit: Payer: Self-pay

## 2020-08-03 DIAGNOSIS — Z23 Encounter for immunization: Secondary | ICD-10-CM

## 2020-10-07 ENCOUNTER — Other Ambulatory Visit: Payer: Self-pay

## 2020-10-07 ENCOUNTER — Ambulatory Visit (INDEPENDENT_AMBULATORY_CARE_PROVIDER_SITE_OTHER): Payer: PPO | Admitting: Family

## 2020-10-07 ENCOUNTER — Encounter: Payer: Self-pay | Admitting: Family

## 2020-10-07 VITALS — BP 117/77 | HR 89 | Temp 97.7°F | Ht 70.0 in | Wt 294.8 lb

## 2020-10-07 DIAGNOSIS — E1169 Type 2 diabetes mellitus with other specified complication: Secondary | ICD-10-CM | POA: Diagnosis not present

## 2020-10-07 DIAGNOSIS — Z23 Encounter for immunization: Secondary | ICD-10-CM

## 2020-10-07 DIAGNOSIS — E785 Hyperlipidemia, unspecified: Secondary | ICD-10-CM

## 2020-10-07 DIAGNOSIS — N183 Chronic kidney disease, stage 3 unspecified: Secondary | ICD-10-CM | POA: Diagnosis not present

## 2020-10-07 DIAGNOSIS — E1159 Type 2 diabetes mellitus with other circulatory complications: Secondary | ICD-10-CM | POA: Diagnosis not present

## 2020-10-07 DIAGNOSIS — I700 Atherosclerosis of aorta: Secondary | ICD-10-CM | POA: Diagnosis not present

## 2020-10-07 DIAGNOSIS — E1165 Type 2 diabetes mellitus with hyperglycemia: Secondary | ICD-10-CM | POA: Diagnosis not present

## 2020-10-07 DIAGNOSIS — I152 Hypertension secondary to endocrine disorders: Secondary | ICD-10-CM

## 2020-10-07 LAB — BAYER DCA HB A1C WAIVED: HB A1C (BAYER DCA - WAIVED): 7.2 % — ABNORMAL HIGH (ref <7.0)

## 2020-10-07 NOTE — Patient Instructions (Signed)
Health Maintenance After Age 68 After age 68, you are at a higher risk for certain long-term diseases and infections as well as injuries from falls. Falls are a major cause of broken bones and head injuries in people who are older than age 68. Getting regular preventive care can help to keep you healthy and well. Preventive care includes getting regular testing and making lifestyle changes as recommended by your health care provider. Talk with your health care provider about:  Which screenings and tests you should have. A screening is a test that checks for a disease when you have no symptoms.  A diet and exercise plan that is right for you. What should I know about screenings and tests to prevent falls? Screening and testing are the best ways to find a health problem early. Early diagnosis and treatment give you the best chance of managing medical conditions that are common after age 68. Certain conditions and lifestyle choices may make you more likely to have a fall. Your health care provider may recommend:  Regular vision checks. Poor vision and conditions such as cataracts can make you more likely to have a fall. If you wear glasses, make sure to get your prescription updated if your vision changes.  Medicine review. Work with your health care provider to regularly review all of the medicines you are taking, including over-the-counter medicines. Ask your health care provider about any side effects that may make you more likely to have a fall. Tell your health care provider if any medicines that you take make you feel dizzy or sleepy.  Osteoporosis screening. Osteoporosis is a condition that causes the bones to get weaker. This can make the bones weak and cause them to break more easily.  Blood pressure screening. Blood pressure changes and medicines to control blood pressure can make you feel dizzy.  Strength and balance checks. Your health care provider may recommend certain tests to check your  strength and balance while standing, walking, or changing positions.  Foot health exam. Foot pain and numbness, as well as not wearing proper footwear, can make you more likely to have a fall.  Depression screening. You may be more likely to have a fall if you have a fear of falling, feel emotionally low, or feel unable to do activities that you used to do.  Alcohol use screening. Using too much alcohol can affect your balance and may make you more likely to have a fall. What actions can I take to lower my risk of falls? General instructions  Talk with your health care provider about your risks for falling. Tell your health care provider if: ? You fall. Be sure to tell your health care provider about all falls, even ones that seem minor. ? You feel dizzy, sleepy, or off-balance.  Take over-the-counter and prescription medicines only as told by your health care provider. These include any supplements.  Eat a healthy diet and maintain a healthy weight. A healthy diet includes low-fat dairy products, low-fat (lean) meats, and fiber from whole grains, beans, and lots of fruits and vegetables. Home safety  Remove any tripping hazards, such as rugs, cords, and clutter.  Install safety equipment such as grab bars in bathrooms and safety rails on stairs.  Keep rooms and walkways well-lit. Activity  Follow a regular exercise program to stay fit. This will help you maintain your balance. Ask your health care provider what types of exercise are appropriate for you.  If you need a cane or walker,   use it as recommended by your health care provider.  Wear supportive shoes that have nonskid soles.   Lifestyle  Do not drink alcohol if your health care provider tells you not to drink.  If you drink alcohol, limit how much you have: ? 0-1 drink a day for women. ? 0-2 drinks a day for men.  Be aware of how much alcohol is in your drink. In the [place] one drink equals one typical bottle of beer (12  oz), one-half glass of wine (5 oz), or one shot of hard liquor (1 oz).  Do not use any products that contain nicotine or tobacco, such as cigarettes and e-cigarettes. If you need help quitting, ask your health care provider. Summary  Having a healthy lifestyle and getting preventive care can help to protect your health and wellness after age 68.  Screening and testing are the best way to find a health problem early and help you avoid having a fall. Early diagnosis and treatment give you the best chance for managing medical conditions that are more common for people who are older than age 68.  Falls are a major cause of broken bones and head injuries in people who are older than age 68. Take precautions to prevent a fall at home.  Work with your health care provider to learn what changes you can make to improve your health and wellness and to prevent falls. This information is not intended to replace advice given to you by your health care provider. Make sure you discuss any questions you have with your health care provider. Document Revised: 01/02/2019 Document Reviewed: 07/25/2017 Elsevier Patient Education  2021 Elsevier Inc.  

## 2020-10-07 NOTE — Progress Notes (Addendum)
Subjective:    Patient ID: Adam Barrera, male    DOB: [birth year], 70 y.o.   MRN: [id number]  Chief Complaint  Patient presents with  . Diabetes  . Hypertension   Pt presents to the office today for chronic follow up.  Diabetes He presents for his follow-up diabetic visit. He has type 2 diabetes mellitus. His disease course has been stable. There are no hypoglycemic associated symptoms. There are no diabetic associated symptoms. Pertinent negatives for diabetes include no blurred vision and no foot paresthesias. Symptoms are stable. Pertinent negatives for diabetic complications include no CVA, heart disease or peripheral neuropathy. Risk factors for coronary artery disease include diabetes mellitus, dyslipidemia, male sex, hypertension and sedentary lifestyle. He is following a generally unhealthy diet. His overall blood glucose range is 110-130 mg/dl. An ACE inhibitor/angiotensin II receptor blocker is being taken.  Hypertension This is a chronic problem. The current episode started more than 1 year ago. The problem has been resolved since onset. Pertinent negatives include no blurred vision, malaise/fatigue, peripheral edema or shortness of breath. Risk factors for coronary artery disease include dyslipidemia, diabetes mellitus, obesity, male gender and sedentary lifestyle. The current treatment provides moderate improvement. There is no history of CVA or heart failure.  Hyperlipidemia This is a chronic problem. The current episode started more than 1 year ago. Exacerbating diseases include obesity. Pertinent negatives include no shortness of breath. Current antihyperlipidemic treatment includes statins. The current treatment provides moderate improvement of lipids. Risk factors for coronary artery disease include dyslipidemia, male sex, hypertension and a sedentary lifestyle.      Review of Systems  Constitutional: Negative for malaise/fatigue.  Eyes: Negative for blurred vision.   Respiratory: Negative for shortness of breath.   All other systems reviewed and are negative.      Objective:   Physical Exam Vitals reviewed.  Constitutional:      General: He is not in acute distress.    Appearance: He is well-developed and well-nourished. He is obese.  HENT:     Head: Normocephalic.     Right Ear: Tympanic membrane normal.     Left Ear: Tympanic membrane normal.     Mouth/Throat:     Mouth: Oropharynx is clear and moist.  Eyes:     General:        Right eye: No discharge.        Left eye: No discharge.     Pupils: Pupils are equal, round, and reactive to light.  Neck:     Thyroid: No thyromegaly.  Cardiovascular:     Rate and Rhythm: Normal rate and regular rhythm.     Pulses: Intact distal pulses.     Heart sounds: Normal heart sounds. No murmur heard.   Pulmonary:     Effort: Pulmonary effort is normal. No respiratory distress.     Breath sounds: Normal breath sounds. No wheezing.  Abdominal:     General: Bowel sounds are normal. There is no distension.     Palpations: Abdomen is soft.     Tenderness: There is no abdominal tenderness.  Musculoskeletal:        General: No tenderness. Normal range of motion.     Cervical back: Normal range of motion and neck supple.     Right lower leg: Edema (trace) present.     Left lower leg: Edema (trace) present.  Skin:    General: Skin is warm and dry.     Findings: No erythema or rash.  Neurological:  Mental Status: He is alert and oriented to person, place, and time.     Cranial Nerves: No cranial nerve deficit.     Deep Tendon Reflexes: Reflexes are normal and symmetric.  Psychiatric:        Mood and Affect: Mood and affect normal.        Behavior: Behavior normal.        Thought Content: Thought content normal.        Judgment: Judgment normal.    Diabetic Foot Exam - Simple   Simple Foot Form Diabetic Foot exam was performed with the following findings: Yes 10/07/2020 10:16 AM  Visual  Inspection No deformities, no ulcerations, no other skin breakdown bilaterally: Yes Sensation Testing Intact to touch and monofilament testing bilaterally: Yes Pulse Check Posterior Tibialis and Dorsalis pulse intact bilaterally: Yes Comments      BP 117/77   Pulse 89   Temp 97.7 F (36.5 C) (Temporal)   Ht '5\' 10"'  (1.778 m)   Wt 294 lb 12.8 oz (133.7 kg)   BMI 42.30 kg/m       Assessment & Plan:  Adam Barrera comes in today with chief complaint of Diabetes and Hypertension   Diagnosis and orders addressed:  1. Aortic atherosclerosis (HCC) - CMP14+EGFR - CBC with Differential/Platelet  2. Hypertension associated with diabetes ([place]) - CMP14+EGFR - CBC with Differential/Platelet  3. Hyperlipidemia associated with type 2 diabetes mellitus ([place]) - CMP14+EGFR - CBC with Differential/Platelet - Lipid panel  4. Type 2 diabetes mellitus with hyperglycemia, without long-term current use of insulin (HCC) - Bayer DCA Hb A1c Waived - CMP14+EGFR - CBC with Differential/Platelet - Microalbumin / creatinine urine ratio  5. Stage 3 chronic kidney disease, unspecified whether stage 3a or 3b CKD (HCC) - CMP14+EGFR - CBC with Differential/Platelet  6. Morbid obesity ([place]) - CMP14+EGFR - CBC with Differential/Platelet   Labs pending Health Maintenance reviewed Diet and exercise encouraged  Follow up plan: 6 months    Evelina Dun, FNP

## 2020-10-08 ENCOUNTER — Other Ambulatory Visit: Payer: Self-pay | Admitting: Family

## 2020-10-08 LAB — CBC WITH DIFFERENTIAL/PLATELET
Basophils Absolute: 0 10*3/uL (ref 0.0–0.2)
Basos: 0 %
EOS (ABSOLUTE): 0.3 10*3/uL (ref 0.0–0.4)
Eos: 4 %
Hematocrit: 42.3 % (ref 37.5–51.0)
Hemoglobin: 14.2 g/dL (ref 13.0–17.7)
Immature Grans (Abs): 0 10*3/uL (ref 0.0–0.1)
Immature Granulocytes: 0 %
Lymphocytes Absolute: 1.8 10*3/uL (ref 0.7–3.1)
Lymphs: 25 %
MCH: 30.2 pg (ref 26.6–33.0)
MCHC: 33.6 g/dL (ref 31.5–35.7)
MCV: 90 fL (ref 79–97)
Monocytes Absolute: 0.4 10*3/uL (ref 0.1–0.9)
Monocytes: 5 %
Neutrophils Absolute: 4.6 10*3/uL (ref 1.4–7.0)
Neutrophils: 66 %
Platelets: 189 10*3/uL (ref 150–450)
RBC: 4.7 x10E6/uL (ref 4.14–5.80)
RDW: 12.6 % (ref 11.6–15.4)
WBC: 7.1 10*3/uL (ref 3.4–10.8)

## 2020-10-08 LAB — MICROALBUMIN / CREATININE URINE RATIO
Creatinine, Urine: 158.1 mg/dL
Microalb/Creat Ratio: 18 mg/g creat (ref 0–29)
Microalbumin, Urine: 29.2 ug/mL

## 2020-10-08 LAB — CMP14+EGFR
ALT: 20 IU/L (ref 0–44)
AST: 18 IU/L (ref 0–40)
Albumin/Globulin Ratio: 1.7 (ref 1.2–2.2)
Albumin: 4.4 g/dL (ref 3.8–4.8)
Alkaline Phosphatase: 66 IU/L (ref 44–121)
BUN/Creatinine Ratio: 8 — ABNORMAL LOW (ref 10–24)
BUN: 9 mg/dL (ref 8–27)
Bilirubin Total: 0.9 mg/dL (ref 0.0–1.2)
CO2: 24 mmol/L (ref 20–29)
Calcium: 9.7 mg/dL (ref 8.6–10.2)
Chloride: 102 mmol/L (ref 96–106)
Creatinine, Ser: 1.15 mg/dL (ref 0.76–1.27)
GFR calc Af Amer: 76 mL/min/{1.73_m2} (ref >59)
GFR calc non Af Amer: 65 mL/min/{1.73_m2} (ref >59)
Globulin, Total: 2.6 g/dL (ref 1.5–4.5)
Glucose: 142 mg/dL — ABNORMAL HIGH (ref 65–99)
Potassium: 4.8 mmol/L (ref 3.5–5.2)
Sodium: 141 mmol/L (ref 134–144)
Total Protein: 7 g/dL (ref 6.0–8.5)

## 2020-10-08 LAB — LIPID PANEL
Chol/HDL Ratio: 3.9 ratio (ref 0.0–5.0)
Cholesterol, Total: 155 mg/dL (ref 100–199)
HDL: 40 mg/dL (ref >39)
LDL Chol Calc (NIH): 93 mg/dL (ref 0–99)
Triglycerides: 122 mg/dL (ref 0–149)
VLDL Cholesterol Cal: 22 mg/dL (ref 5–40)

## 2020-10-08 MED ORDER — ROSUVASTATIN CALCIUM 20 MG PO TABS: 20.0000 mg | ORAL_TABLET | Freq: Every day | ORAL | 4 refills | Status: DC

## 2020-10-08 NOTE — Progress Notes (Signed)
Pt is returning nurse call.

## 2020-10-13 ENCOUNTER — Other Ambulatory Visit: Payer: Self-pay | Admitting: Family

## 2020-10-13 ENCOUNTER — Telehealth: Payer: Self-pay

## 2020-10-13 DIAGNOSIS — I10 Essential (primary) hypertension: Secondary | ICD-10-CM

## 2020-10-13 NOTE — Telephone Encounter (Signed)
See results

## 2020-10-25 ENCOUNTER — Other Ambulatory Visit: Payer: Self-pay | Admitting: Family

## 2020-10-25 ENCOUNTER — Telehealth: Payer: Self-pay

## 2020-10-25 MED ORDER — LANCETS MISC: 3 refills | Status: DC

## 2020-10-25 NOTE — Telephone Encounter (Signed)
Pt aware refill sent to pharmacy 

## 2020-10-25 NOTE — Telephone Encounter (Signed)
  Prescription Request  10/25/2020  What is the name of the medication or equipment? ACCU CHEK Guide lancets  Have you contacted your pharmacy to request a refill? (if applicable) YES  Which pharmacy would you like this sent to? [facility]   Patient notified that their request is being sent to the clinical staff for review and that they should receive a response within 2 business days.

## 2020-12-16 ENCOUNTER — Encounter: Payer: Self-pay | Admitting: *Deleted

## 2020-12-27 DIAGNOSIS — H524 Presbyopia: Secondary | ICD-10-CM | POA: Diagnosis not present

## 2020-12-27 LAB — HM DIABETES EYE EXAM

## 2021-01-06 ENCOUNTER — Ambulatory Visit: Payer: PPO | Admitting: Family

## 2021-01-07 ENCOUNTER — Other Ambulatory Visit: Payer: Self-pay | Admitting: Family

## 2021-01-07 DIAGNOSIS — I10 Essential (primary) hypertension: Secondary | ICD-10-CM

## 2021-01-11 ENCOUNTER — Ambulatory Visit (INDEPENDENT_AMBULATORY_CARE_PROVIDER_SITE_OTHER): Payer: PPO | Admitting: Family

## 2021-01-11 ENCOUNTER — Other Ambulatory Visit: Payer: Self-pay | Admitting: Family

## 2021-01-11 ENCOUNTER — Encounter: Payer: Self-pay | Admitting: Family

## 2021-01-11 ENCOUNTER — Other Ambulatory Visit: Payer: Self-pay

## 2021-01-11 VITALS — BP 128/83 | HR 89 | Temp 98.3°F | Ht 70.0 in | Wt 292.8 lb

## 2021-01-11 DIAGNOSIS — K760 Fatty (change of) liver, not elsewhere classified: Secondary | ICD-10-CM

## 2021-01-11 DIAGNOSIS — E1165 Type 2 diabetes mellitus with hyperglycemia: Secondary | ICD-10-CM | POA: Diagnosis not present

## 2021-01-11 DIAGNOSIS — J301 Allergic rhinitis due to pollen: Secondary | ICD-10-CM

## 2021-01-11 DIAGNOSIS — R918 Other nonspecific abnormal finding of lung field: Secondary | ICD-10-CM

## 2021-01-11 DIAGNOSIS — I10 Essential (primary) hypertension: Secondary | ICD-10-CM

## 2021-01-11 DIAGNOSIS — E785 Hyperlipidemia, unspecified: Secondary | ICD-10-CM

## 2021-01-11 DIAGNOSIS — N183 Chronic kidney disease, stage 3 unspecified: Secondary | ICD-10-CM | POA: Diagnosis not present

## 2021-01-11 DIAGNOSIS — N3281 Overactive bladder: Secondary | ICD-10-CM

## 2021-01-11 DIAGNOSIS — I700 Atherosclerosis of aorta: Secondary | ICD-10-CM | POA: Diagnosis not present

## 2021-01-11 DIAGNOSIS — I152 Hypertension secondary to endocrine disorders: Secondary | ICD-10-CM

## 2021-01-11 DIAGNOSIS — Z77090 Contact with and (suspected) exposure to asbestos: Secondary | ICD-10-CM

## 2021-01-11 DIAGNOSIS — E8881 Metabolic syndrome: Secondary | ICD-10-CM

## 2021-01-11 DIAGNOSIS — E1169 Type 2 diabetes mellitus with other specified complication: Secondary | ICD-10-CM | POA: Diagnosis not present

## 2021-01-11 DIAGNOSIS — M1712 Unilateral primary osteoarthritis, left knee: Secondary | ICD-10-CM | POA: Diagnosis not present

## 2021-01-11 DIAGNOSIS — E1159 Type 2 diabetes mellitus with other circulatory complications: Secondary | ICD-10-CM

## 2021-01-11 DIAGNOSIS — J61 Pneumoconiosis due to asbestos and other mineral fibers: Secondary | ICD-10-CM

## 2021-01-11 DIAGNOSIS — M19019 Primary osteoarthritis, unspecified shoulder: Secondary | ICD-10-CM | POA: Diagnosis not present

## 2021-01-11 DIAGNOSIS — Z860100 Personal history of colon polyps, unspecified: Secondary | ICD-10-CM | POA: Insufficient documentation

## 2021-01-11 DIAGNOSIS — Z87891 Personal history of nicotine dependence: Secondary | ICD-10-CM

## 2021-01-11 LAB — BAYER DCA HB A1C WAIVED: HB A1C (BAYER DCA - WAIVED): 7.6 % — ABNORMAL HIGH (ref <7.0)

## 2021-01-11 MED ORDER — PRAVASTATIN SODIUM 40 MG PO TABS
40.0000 mg | ORAL_TABLET | Freq: Every day | ORAL | 3 refills | Status: DC
Start: 2021-01-11 — End: 2021-01-11

## 2021-01-11 MED ORDER — ONETOUCH ULTRASOFT LANCETS MISC: 11 refills | Status: AC

## 2021-01-11 MED ORDER — METFORMIN HCL 1000 MG PO TABS: 1.0000 | ORAL_TABLET | Freq: Two times a day (BID) | ORAL | 3 refills | Status: DC

## 2021-01-11 MED ORDER — ACCU-CHEK FASTCLIX LANCETS MISC: 3.0000 | Freq: Three times a day (TID) | 11 refills | Status: DC

## 2021-01-11 MED ORDER — LOSARTAN POTASSIUM 100 MG PO TABS
1.0000 | ORAL_TABLET | Freq: Every day | ORAL | 2 refills | Status: DC
Start: 2021-01-11 — End: 2021-07-12

## 2021-01-11 MED ORDER — CETIRIZINE HCL 10 MG PO TABS: 10.0000 mg | ORAL_TABLET | Freq: Every day | ORAL | 1 refills | Status: DC

## 2021-01-11 MED ORDER — OXYBUTYNIN CHLORIDE ER 10 MG PO TB24: 10.0000 mg | ORAL_TABLET | Freq: Every day | ORAL | 3 refills | Status: DC

## 2021-01-11 MED ORDER — ROSUVASTATIN CALCIUM 20 MG PO TABS
20.0000 mg | ORAL_TABLET | Freq: Every day | ORAL | 4 refills | Status: DC
Start: 2021-01-11 — End: 2021-07-12

## 2021-01-11 MED ORDER — FLUTICASONE PROPIONATE 50 MCG/ACT NA SUSP: 2.0000 | Freq: Every day | NASAL | 6 refills | Status: AC

## 2021-01-11 NOTE — Patient Instructions (Signed)
Allergic Rhinitis, Adult  Allergic rhinitis is an allergic reaction that affects the mucous membrane inside the nose. The mucous membrane is the tissue that produces mucus. There are two types of allergic rhinitis:  Seasonal. This type is also called hay fever and happens only during certain seasons.  Perennial. This type can happen at any time of the year. Allergic rhinitis cannot be spread from person to person. This condition can be mild, moderate, or severe. It can develop at any age and may be outgrown. What are the causes? This condition is caused by allergens. These are things that can cause an allergic reaction. Allergens may differ for seasonal allergic rhinitis and perennial allergic rhinitis.  Seasonal allergic rhinitis is triggered by pollen. Pollen can come from grasses, trees, and weeds.  Perennial allergic rhinitis may be triggered by: ? Dust mites. ? Proteins in a pet's urine, saliva, or dander. Dander is dead skin cells from a pet. ? Smoke, mold, or car fumes. What increases the risk? You are more likely to develop this condition if you have a family history of allergies or other conditions related to allergies, including:  Allergic conjunctivitis. This is inflammation of parts of the eyes and eyelids.  Asthma. This condition affects the lungs and makes it hard to breathe.  Atopic dermatitis or eczema. This is long term (chronic) inflammation of the skin.  Food allergies. What are the signs or symptoms? Symptoms of this condition include:  Sneezing or coughing.  A stuffy nose (nasal congestion), itchy nose, or nasal discharge.  Itchy eyes and tearing of the eyes.  A feeling of mucus dripping down the back of your throat (postnasal drip).  Trouble sleeping.  Tiredness or fatigue.  Headache.  Sore throat. How is this diagnosed? This condition may be diagnosed with your symptoms, medical history, and physical exam. Your health care provider may check for  related conditions, such as:  Asthma.  Pink eye. This is eye inflammation caused by infection (conjunctivitis).  Ear infection.  Upper respiratory infection. This is an infection in the nose, throat, or upper airways. You may also have tests to find out which allergens trigger your symptoms. These may include skin tests or blood tests. How is this treated? There is no cure for this condition, but treatment can help control symptoms. Treatment may include:  Taking medicines that block allergy symptoms, such as corticosteroids and antihistamines. Medicine may be given as a shot, nasal spray, or pill.  Avoiding any allergens.  Being exposed again and again to tiny amounts of allergens to help you build a defense against allergens (immunotherapy). This is done if other treatments have not helped. It may include: ? Allergy shots. These are injected medicines that have small amounts of allergen in them. ? Sublingual immunotherapy. This involves taking small doses of a medicine with allergen in it under your tongue. If these treatments do not work, your health care provider may prescribe newer, stronger medicines. Follow these instructions at home: Avoiding allergens Find out what you are allergic to and avoid those allergens. These are some things you can do to help avoid allergens:  If you have perennial allergies: ? Replace carpet with wood, tile, or vinyl flooring. Carpet can trap dander and dust. ? Do not smoke. Do not allow smoking in your home. ? Change your heating and air conditioning filters at least once a month.  If you have seasonal allergies, take these steps during allergy season: ? Keep windows closed as much as possible. ?   Plan outdoor activities when pollen counts are lowest. Check pollen counts before you plan outdoor activities. ? When coming indoors, change clothing and shower before sitting on furniture or bedding.  If you have a pet in the house that produces  allergens: ? Keep the pet out of the bedroom. ? Vacuum, sweep, and dust regularly. General instructions  Take over-the-counter and prescription medicines only as told by your health care provider.  Drink enough fluid to keep your urine pale yellow.  Keep all follow-up visits as told by your health care provider. This is important. Where to find more information  American Academy of Allergy, Asthma & Immunology: www.aaaai.org Contact a health care provider if:  You have a fever.  You develop a cough that does not go away.  You make whistling sounds when you breathe (wheeze).  Your symptoms slow you down or stop you from doing your normal activities each day. Get help right away if:  You have shortness of breath. This symptom may represent a serious problem that is an emergency. Do not wait to see if the symptom will go away. Get medical help right away. Call your local emergency services (911 in the [place] Do not drive yourself to the hospital. Summary  Allergic rhinitis may be managed by taking medicines as directed and avoiding allergens.  If you have seasonal allergies, keep windows closed as much as possible during allergy season.  Contact your health care provider if you develop a fever or a cough that does not go away. This information is not intended to replace advice given to you by your health care provider. Make sure you discuss any questions you have with your health care provider. Document Revised: 10/31/2019 Document Reviewed: 09/09/2019 Elsevier Patient Education  2021 [employer] Inc.  

## 2021-01-11 NOTE — Progress Notes (Signed)
Subjective:    Patient ID: Adam Barrera, male    DOB: [birth year], 70 y.o.   MRN: [id number]  Chief Complaint  Patient presents with  . Diabetes  . Hypertension    3 month check up    Pt presents to the office todayfor chronic follow up.He is followed by Pulmonologist for asbestosis and pulmonary nodules.  Diabetes He presents for his follow-up diabetic visit. He has type 2 diabetes mellitus. There are no hypoglycemic associated symptoms. Pertinent negatives for diabetes include no blurred vision and no foot paresthesias. There are no hypoglycemic complications. Symptoms are stable. Diabetic complications include nephropathy. Pertinent negatives for diabetic complications include no CVA, heart disease or peripheral neuropathy. Risk factors for coronary artery disease include diabetes mellitus, dyslipidemia, male sex, hypertension and sedentary lifestyle. He is following a generally unhealthy diet. His overall blood glucose range is 110-130 mg/dl. An ACE inhibitor/angiotensin II receptor blocker is being taken. Eye exam is current.  Hypertension This is a chronic problem. The current episode started more than 1 year ago. The problem has been resolved since onset. The problem is controlled. Pertinent negatives include no blurred vision, malaise/fatigue, peripheral edema or shortness of breath. Risk factors for coronary artery disease include dyslipidemia, obesity, male gender and sedentary lifestyle. The current treatment provides moderate improvement. There is no history of CVA.  Hyperlipidemia This is a chronic problem. The current episode started more than 1 year ago. Exacerbating diseases include obesity. Pertinent negatives include no shortness of breath. Current antihyperlipidemic treatment includes statins. The current treatment provides moderate improvement of lipids.  Arthritis Presents for follow-up visit. He complains of pain and stiffness. The symptoms have been stable. Affected  locations include the left knee, right knee, left MCP, right MCP, left shoulder and right shoulder. His pain is at a severity of 7/10.  Urinary Frequency  This is a chronic problem. The problem occurs intermittently. The problem has been waxing and waning. Associated symptoms include frequency.      Review of Systems  Constitutional: Negative for malaise/fatigue.  Eyes: Negative for blurred vision.  Respiratory: Negative for shortness of breath.   Genitourinary: Positive for frequency.  Musculoskeletal: Positive for arthritis and stiffness.  All other systems reviewed and are negative.      Objective:   Physical Exam Vitals reviewed.  Constitutional:      General: He is not in acute distress.    Appearance: He is well-developed. He is obese.  HENT:     Head: Normocephalic.     Right Ear: Tympanic membrane normal.     Left Ear: Tympanic membrane normal.  Eyes:     General:        Right eye: No discharge.        Left eye: No discharge.     Pupils: Pupils are equal, round, and reactive to light.  Neck:     Thyroid: No thyromegaly.  Cardiovascular:     Rate and Rhythm: Normal rate and regular rhythm.     Heart sounds: Normal heart sounds. No murmur heard.   Pulmonary:     Effort: Pulmonary effort is normal. No respiratory distress.     Breath sounds: Normal breath sounds. No wheezing.  Abdominal:     General: Bowel sounds are normal. There is no distension.     Palpations: Abdomen is soft.     Tenderness: There is no abdominal tenderness.  Musculoskeletal:        General: No tenderness. Normal range of motion.  Cervical back: Normal range of motion and neck supple.  Skin:    General: Skin is warm and dry.     Findings: No erythema or rash.  Neurological:     Mental Status: He is alert and oriented to person, place, and time.     Cranial Nerves: No cranial nerve deficit.     Deep Tendon Reflexes: Reflexes are normal and symmetric.  Psychiatric:        Behavior:  Behavior normal.        Thought Content: Thought content normal.        Judgment: Judgment normal.       BP 128/83   Pulse 89   Temp 98.3 F (36.8 C) (Temporal)   Ht '5\' 10"'  (1.778 m)   Wt 292 lb 12.8 oz (132.8 kg)   SpO2 95%   BMI 42.01 kg/m      Assessment & Plan:  Adam Barrera comes in today with chief complaint of Diabetes and Hypertension (3 month check up)   Diagnosis and orders addressed:  1. OAB (overactive bladder) - oxybutynin (DITROPAN-XL) 10 MG 24 hr tablet; Take 1 tablet (10 mg total) by mouth at bedtime.  Dispense: 90 tablet; Refill: 3 - CMP14+EGFR - CBC with Differential/Platelet  2. Hyperlipidemia, unspecified hyperlipidemia type - CMP14+EGFR - CBC with Differential/Platelet  3. Essential hypertension, benign - losartan (COZAAR) 100 MG tablet; Take 1 tablet (100 mg total) by mouth daily.  Dispense: 90 tablet; Refill: 2 - CMP14+EGFR - CBC with Differential/Platelet  4. Aortic atherosclerosis (HCC) - rosuvastatin (CRESTOR) 20 MG tablet; Take 1 tablet (20 mg total) by mouth daily.  Dispense: 90 tablet; Refill: 4 - CMP14+EGFR - CBC with Differential/Platelet  5. Hypertension associated with diabetes ([place]) - CMP14+EGFR - CBC with Differential/Platelet  6. Type 2 diabetes mellitus with hyperglycemia, without long-term current use of insulin (HCC) - metFORMIN (GLUCOPHAGE) 1000 MG tablet; Take 1 tablet (1,000 mg total) by mouth 2 (two) times daily.  Dispense: 180 tablet; Refill: 3 - CMP14+EGFR - CBC with Differential/Platelet - Bayer DCA Hb A1c Waived  7. Hyperlipidemia associated with type 2 diabetes mellitus (HCC) - rosuvastatin (CRESTOR) 20 MG tablet; Take 1 tablet (20 mg total) by mouth daily.  Dispense: 90 tablet; Refill: 4 - CMP14+EGFR - CBC with Differential/Platelet  8. Primary osteoarthritis of left knee - CMP14+EGFR - CBC with Differential/Platelet  9. Shoulder arthritis - CMP14+EGFR - CBC with Differential/Platelet  10. Stage 3  chronic kidney disease, unspecified whether stage 3a or 3b CKD (HCC) - CMP14+EGFR - CBC with Differential/Platelet  11. Metabolic syndrome - QHU76+LYYT - CBC with Differential/Platelet  12. Morbid obesity ([place]) - CMP14+EGFR - CBC with Differential/Platelet  13. Multiple pulmonary nodules - CMP14+EGFR - CBC with Differential/Platelet  14. Former smoker - CMP14+EGFR - CBC with Differential/Platelet  15. Asbestos exposure s evidence asbestosis - CMP14+EGFR - CBC with Differential/Platelet  16. Asbestosis ([place]) - CMP14+EGFR - CBC with Differential/Platelet  17. Hepatic steatosis - CMP14+EGFR - CBC with Differential/Platelet  18. Allergic rhinitis due to pollen, unspecified seasonality  - cetirizine (ZYRTEC) 10 MG tablet; Take 1 tablet (10 mg total) by mouth daily.  Dispense: 90 tablet; Refill: 1   Labs pending Health Maintenance reviewed Diet and exercise encouraged  Follow up plan: 4 months    Evelina Dun, FNP

## 2021-01-12 LAB — CMP14+EGFR
ALT: 12 IU/L (ref 0–44)
AST: 12 IU/L (ref 0–40)
Albumin/Globulin Ratio: 1.6 (ref 1.2–2.2)
Albumin: 4.1 g/dL (ref 3.8–4.8)
Alkaline Phosphatase: 66 IU/L (ref 44–121)
BUN/Creatinine Ratio: 9 — ABNORMAL LOW (ref 10–24)
BUN: 10 mg/dL (ref 8–27)
Bilirubin Total: 0.8 mg/dL (ref 0.0–1.2)
CO2: 24 mmol/L (ref 20–29)
Calcium: 9.5 mg/dL (ref 8.6–10.2)
Chloride: 99 mmol/L (ref 96–106)
Creatinine, Ser: 1.17 mg/dL (ref 0.76–1.27)
Globulin, Total: 2.6 g/dL (ref 1.5–4.5)
Glucose: 219 mg/dL — ABNORMAL HIGH (ref 65–99)
Potassium: 5.1 mmol/L (ref 3.5–5.2)
Sodium: 138 mmol/L (ref 134–144)
Total Protein: 6.7 g/dL (ref 6.0–8.5)
eGFR: 68 mL/min/{1.73_m2} (ref >59)

## 2021-01-12 LAB — CBC WITH DIFFERENTIAL/PLATELET
Basophils Absolute: 0 10*3/uL (ref 0.0–0.2)
Basos: 1 %
EOS (ABSOLUTE): 0.3 10*3/uL (ref 0.0–0.4)
Eos: 5 %
Hematocrit: 39.5 % (ref 37.5–51.0)
Hemoglobin: 12.9 g/dL — ABNORMAL LOW (ref 13.0–17.7)
Immature Grans (Abs): 0 10*3/uL (ref 0.0–0.1)
Immature Granulocytes: 0 %
Lymphocytes Absolute: 1.2 10*3/uL (ref 0.7–3.1)
Lymphs: 20 %
MCH: 29.4 pg (ref 26.6–33.0)
MCHC: 32.7 g/dL (ref 31.5–35.7)
MCV: 90 fL (ref 79–97)
Monocytes Absolute: 0.3 10*3/uL (ref 0.1–0.9)
Monocytes: 5 %
Neutrophils Absolute: 4.3 10*3/uL (ref 1.4–7.0)
Neutrophils: 69 %
Platelets: 177 10*3/uL (ref 150–450)
RBC: 4.39 x10E6/uL (ref 4.14–5.80)
RDW: 13.2 % (ref 11.6–15.4)
WBC: 6.3 10*3/uL (ref 3.4–10.8)

## 2021-01-13 ENCOUNTER — Other Ambulatory Visit: Payer: Self-pay | Admitting: Family

## 2021-01-13 MED ORDER — EMPAGLIFLOZIN 10 MG PO TABS: 10.0000 mg | ORAL_TABLET | Freq: Every day | ORAL | 1 refills | Status: DC

## 2021-04-14 DIAGNOSIS — Z23 Encounter for immunization: Secondary | ICD-10-CM | POA: Diagnosis not present

## 2021-07-07 ENCOUNTER — Other Ambulatory Visit: Payer: Self-pay | Admitting: Family

## 2021-07-07 DIAGNOSIS — J301 Allergic rhinitis due to pollen: Secondary | ICD-10-CM

## 2021-07-12 ENCOUNTER — Ambulatory Visit (INDEPENDENT_AMBULATORY_CARE_PROVIDER_SITE_OTHER): Payer: PPO | Admitting: Family

## 2021-07-12 ENCOUNTER — Other Ambulatory Visit: Payer: Self-pay

## 2021-07-12 ENCOUNTER — Encounter: Payer: Self-pay | Admitting: Family

## 2021-07-12 VITALS — BP 116/67 | HR 79 | Temp 97.3°F | Ht 70.0 in | Wt 289.4 lb

## 2021-07-12 DIAGNOSIS — Z23 Encounter for immunization: Secondary | ICD-10-CM

## 2021-07-12 DIAGNOSIS — M1711 Unilateral primary osteoarthritis, right knee: Secondary | ICD-10-CM

## 2021-07-12 DIAGNOSIS — E1165 Type 2 diabetes mellitus with hyperglycemia: Secondary | ICD-10-CM | POA: Diagnosis not present

## 2021-07-12 DIAGNOSIS — N3281 Overactive bladder: Secondary | ICD-10-CM | POA: Diagnosis not present

## 2021-07-12 DIAGNOSIS — I152 Hypertension secondary to endocrine disorders: Secondary | ICD-10-CM

## 2021-07-12 DIAGNOSIS — E1169 Type 2 diabetes mellitus with other specified complication: Secondary | ICD-10-CM | POA: Diagnosis not present

## 2021-07-12 DIAGNOSIS — E1159 Type 2 diabetes mellitus with other circulatory complications: Secondary | ICD-10-CM

## 2021-07-12 DIAGNOSIS — E785 Hyperlipidemia, unspecified: Secondary | ICD-10-CM | POA: Diagnosis not present

## 2021-07-12 DIAGNOSIS — M1712 Unilateral primary osteoarthritis, left knee: Secondary | ICD-10-CM

## 2021-07-12 DIAGNOSIS — I700 Atherosclerosis of aorta: Secondary | ICD-10-CM

## 2021-07-12 DIAGNOSIS — I10 Essential (primary) hypertension: Secondary | ICD-10-CM

## 2021-07-12 DIAGNOSIS — N183 Chronic kidney disease, stage 3 unspecified: Secondary | ICD-10-CM

## 2021-07-12 LAB — BAYER DCA HB A1C WAIVED: HB A1C (BAYER DCA - WAIVED): 7.4 % — ABNORMAL HIGH (ref 4.8–5.6)

## 2021-07-12 MED ORDER — OXYBUTYNIN CHLORIDE ER 10 MG PO TB24: 10.0000 mg | ORAL_TABLET | Freq: Every day | ORAL | 3 refills | Status: DC

## 2021-07-12 MED ORDER — LOSARTAN POTASSIUM 100 MG PO TABS
100.0000 mg | ORAL_TABLET | Freq: Every day | ORAL | 2 refills | Status: DC
Start: 2021-07-12 — End: 2022-04-25

## 2021-07-12 MED ORDER — ROSUVASTATIN CALCIUM 20 MG PO TABS: 20.0000 mg | ORAL_TABLET | Freq: Every day | ORAL | 4 refills | Status: DC

## 2021-07-12 MED ORDER — METFORMIN HCL 1000 MG PO TABS: 1000.0000 mg | ORAL_TABLET | Freq: Two times a day (BID) | ORAL | 3 refills | Status: DC

## 2021-07-12 NOTE — Progress Notes (Signed)
Subjective:    Patient ID: Adam Barrera, male    DOB: [birth year], 70 y.o.   MRN: [id number]  Chief Complaint  Patient presents with   Medical Management of Chronic Issues    Stopped jardiance and lasix    Pt presents to the office today for chronic follow up.  He is followed by Pulmonologist for asbestosis and pulmonary nodules.  Diabetes He presents for his follow-up diabetic visit. He has type 2 diabetes mellitus. Pertinent negatives for diabetes include no blurred vision and no foot paresthesias. Symptoms are stable. Diabetic complications include nephropathy. Pertinent negatives for diabetic complications include no heart disease or peripheral neuropathy. Risk factors for coronary artery disease include dyslipidemia, diabetes mellitus, male sex, hypertension and sedentary lifestyle. He is following a generally unhealthy diet. His overall blood glucose range is 140-180 mg/dl. An ACE inhibitor/angiotensin II receptor blocker is being taken. Eye exam is current.  Hypertension This is a chronic problem. The current episode started more than 1 year ago. The problem has been resolved since onset. The problem is controlled. Associated symptoms include peripheral edema. Pertinent negatives include no blurred vision, malaise/fatigue or shortness of breath. Risk factors for coronary artery disease include dyslipidemia, diabetes mellitus, male gender, obesity, smoking/tobacco exposure and sedentary lifestyle. The current treatment provides moderate improvement.  Hyperlipidemia This is a chronic problem. The current episode started more than 1 year ago. Exacerbating diseases include obesity. Pertinent negatives include no shortness of breath. Current antihyperlipidemic treatment includes statins. The current treatment provides moderate improvement of lipids. Risk factors for coronary artery disease include dyslipidemia, diabetes mellitus, male sex, hypertension and a sedentary lifestyle.   Arthritis Presents for follow-up visit. He complains of pain and stiffness. The symptoms have been stable. Affected locations include the right knee, left knee, left shoulder and right shoulder (back). His pain is at a severity of 4/10.     Review of Systems  Constitutional:  Negative for malaise/fatigue.  Eyes:  Negative for blurred vision.  Respiratory:  Negative for shortness of breath.   Musculoskeletal:  Positive for arthritis and stiffness.  All other systems reviewed and are negative.     Objective:   Physical Exam Vitals reviewed.  Constitutional:      General: He is not in acute distress.    Appearance: He is well-developed. He is obese.  HENT:     Head: Normocephalic.     Right Ear: Tympanic membrane normal.     Left Ear: Tympanic membrane normal.  Eyes:     General:        Right eye: No discharge.        Left eye: No discharge.     Pupils: Pupils are equal, round, and reactive to light.  Neck:     Thyroid: No thyromegaly.  Cardiovascular:     Rate and Rhythm: Normal rate and regular rhythm.     Heart sounds: Normal heart sounds. No murmur heard. Pulmonary:     Effort: Pulmonary effort is normal. No respiratory distress.     Breath sounds: Normal breath sounds. No wheezing.  Abdominal:     General: Bowel sounds are normal. There is no distension.     Palpations: Abdomen is soft.     Tenderness: There is no abdominal tenderness.  Musculoskeletal:        General: No tenderness. Normal range of motion.     Cervical back: Normal range of motion and neck supple.     Right lower leg: Edema (trace) present.  Left lower leg: Edema (trace) present.  Skin:    General: Skin is warm and dry.     Findings: No erythema or rash.  Neurological:     Mental Status: He is alert and oriented to person, place, and time.     Cranial Nerves: No cranial nerve deficit.     Deep Tendon Reflexes: Reflexes are normal and symmetric.  Psychiatric:        Behavior: Behavior  normal.        Thought Content: Thought content normal.        Judgment: Judgment normal.      BP 116/67   Pulse 79   Temp (!) 97.3 F (36.3 C) (Temporal)   Ht '5\' 10"'  (1.778 m)   Wt 289 lb 6.4 oz (131.3 kg)   BMI 41.52 kg/m      Assessment & Plan:  Owin Vignola Grunow comes in today with chief complaint of Medical Management of Chronic Issues (Stopped jardiance and lasix )   Diagnosis and orders addressed:  1. Aortic atherosclerosis (HCC) - rosuvastatin (CRESTOR) 20 MG tablet; Take 1 tablet (20 mg total) by mouth daily.  Dispense: 90 tablet; Refill: 4 - CMP14+EGFR - CBC with Differential/Platelet  2. Hyperlipidemia associated with type 2 diabetes mellitus (HCC) - rosuvastatin (CRESTOR) 20 MG tablet; Take 1 tablet (20 mg total) by mouth daily.  Dispense: 90 tablet; Refill: 4 - CMP14+EGFR - CBC with Differential/Platelet  3. Essential hypertension, benign - losartan (COZAAR) 100 MG tablet; Take 1 tablet (100 mg total) by mouth daily.  Dispense: 90 tablet; Refill: 2 - CMP14+EGFR - CBC with Differential/Platelet  4. OAB (overactive bladder) - oxybutynin (DITROPAN-XL) 10 MG 24 hr tablet; Take 1 tablet (10 mg total) by mouth at bedtime.  Dispense: 90 tablet; Refill: 3 - CMP14+EGFR - CBC with Differential/Platelet  5. Type 2 diabetes mellitus with hyperglycemia, without long-term current use of insulin (HCC) - metFORMIN (GLUCOPHAGE) 1000 MG tablet; Take 1 tablet (1,000 mg total) by mouth 2 (two) times daily.  Dispense: 180 tablet; Refill: 3 - Bayer DCA Hb A1c Waived - CMP14+EGFR - CBC with Differential/Platelet  6. Need for immunization against influenza - Flu Vaccine QUAD High Dose(Fluad) - CMP14+EGFR - CBC with Differential/Platelet  7. Hypertension associated with diabetes ([place]) - CMP14+EGFR - CBC with Differential/Platelet  8. Primary osteoarthritis of left knee - CMP14+EGFR - CBC with Differential/Platelet  9. Unilateral primary osteoarthritis, right knee -  CMP14+EGFR - CBC with Differential/Platelet  10. Stage 3 chronic kidney disease, unspecified whether stage 3a or 3b CKD (HCC) - CMP14+EGFR - CBC with Differential/Platelet  11. Morbid obesity ([place]) - CMP14+EGFR - CBC with Differential/Platelet   Labs pending Health Maintenance reviewed Diet and exercise encouraged  Follow up plan: 4 months   Evelina Dun, FNP

## 2021-07-12 NOTE — Patient Instructions (Signed)
Diabetes Mellitus and Nutrition, Adult When you have diabetes, or diabetes mellitus, it is very important to have healthy eating habits because your blood sugar (glucose) levels are greatly affected by what you eat and drink. Eating healthy foods in the right amounts, at about the same times every day, can help you:  Control your blood glucose.  Lower your risk of heart disease.  Improve your blood pressure.  Reach or maintain a healthy weight. What can affect my meal plan? Every person with diabetes is different, and each person has different needs for a meal plan. Your health care provider may recommend that you work with a dietitian to make a meal plan that is best for you. Your meal plan may vary depending on factors such as:  The calories you need.  The medicines you take.  Your weight.  Your blood glucose, blood pressure, and cholesterol levels.  Your activity level.  Other health conditions you have, such as heart or kidney disease. How do carbohydrates affect me? Carbohydrates, also called carbs, affect your blood glucose level more than any other type of food. Eating carbs naturally raises the amount of glucose in your blood. Carb counting is a method for keeping track of how many carbs you eat. Counting carbs is important to keep your blood glucose at a healthy level, especially if you use insulin or take certain oral diabetes medicines. It is important to know how many carbs you can safely have in each meal. This is different for every person. Your dietitian can help you calculate how many carbs you should have at each meal and for each snack. How does alcohol affect me? Alcohol can cause a sudden decrease in blood glucose (hypoglycemia), especially if you use insulin or take certain oral diabetes medicines. Hypoglycemia can be a life-threatening condition. Symptoms of hypoglycemia, such as sleepiness, dizziness, and confusion, are similar to symptoms of having too much  alcohol.  Do not drink alcohol if: ? Your health care provider tells you not to drink. ? You are pregnant, may be pregnant, or are planning to become pregnant.  If you drink alcohol: ? Do not drink on an empty stomach. ? Limit how much you use to:  0-1 drink a day for women.  0-2 drinks a day for men. ? Be aware of how much alcohol is in your drink. In the [place] one drink equals one 12 oz bottle of beer (355 mL), one 5 oz glass of wine (148 mL), or one 1 oz glass of hard liquor (44 mL). ? Keep yourself hydrated with water, diet soda, or unsweetened iced tea.  Keep in mind that regular soda, juice, and other mixers may contain a lot of sugar and must be counted as carbs. What are tips for following this plan? Reading food labels  Start by checking the serving size on the "Nutrition Facts" label of packaged foods and drinks. The amount of calories, carbs, fats, and other nutrients listed on the label is based on one serving of the item. Many items contain more than one serving per package.  Check the total grams (g) of carbs in one serving. You can calculate the number of servings of carbs in one serving by dividing the total carbs by 15. For example, if a food has 30 g of total carbs per serving, it would be equal to 2 servings of carbs.  Check the number of grams (g) of saturated fats and trans fats in one serving. Choose foods that have   a low amount or none of these fats.  Check the number of milligrams (mg) of salt (sodium) in one serving. Most people should limit total sodium intake to less than 2,300 mg per day.  Always check the nutrition information of foods labeled as "low-fat" or "nonfat." These foods may be higher in added sugar or refined carbs and should be avoided.  Talk to your dietitian to identify your daily goals for nutrients listed on the label. Shopping  Avoid buying canned, pre-made, or processed foods. These foods tend to be high in fat, sodium, and added  sugar.  Shop around the outside edge of the grocery store. This is where you will most often find fresh fruits and vegetables, bulk grains, fresh meats, and fresh dairy. Cooking  Use low-heat cooking methods, such as baking, instead of high-heat cooking methods like deep frying.  Cook using healthy oils, such as olive, canola, or sunflower oil.  Avoid cooking with butter, cream, or high-fat meats. Meal planning  Eat meals and snacks regularly, preferably at the same times every day. Avoid going long periods of time without eating.  Eat foods that are high in fiber, such as fresh fruits, vegetables, beans, and whole grains. Talk with your dietitian about how many servings of carbs you can eat at each meal.  Eat 4-6 oz (112-168 g) of lean protein each day, such as lean meat, chicken, fish, eggs, or tofu. One ounce (oz) of lean protein is equal to: ? 1 oz (28 g) of meat, chicken, or fish. ? 1 egg. ?  cup (62 g) of tofu.  Eat some foods each day that contain healthy fats, such as avocado, nuts, seeds, and fish.   What foods should I eat? Fruits Berries. Apples. Oranges. Peaches. Apricots. Plums. Grapes. Mango. Papaya. Pomegranate. Kiwi. Cherries. Vegetables Lettuce. Spinach. Leafy greens, including kale, chard, collard greens, and mustard greens. Beets. Cauliflower. Cabbage. Broccoli. Carrots. Green beans. Tomatoes. Peppers. Onions. Cucumbers. Brussels sprouts. Grains Whole grains, such as whole-wheat or whole-grain bread, crackers, tortillas, cereal, and pasta. Unsweetened oatmeal. Quinoa. Brown or wild rice. Meats and other proteins Seafood. Poultry without skin. Lean cuts of poultry and beef. Tofu. Nuts. Seeds. Dairy Low-fat or fat-free dairy products such as milk, yogurt, and cheese. The items listed above may not be a complete list of foods and beverages you can eat. Contact a dietitian for more information. What foods should I avoid? Fruits Fruits canned with  syrup. Vegetables Canned vegetables. Frozen vegetables with butter or cream sauce. Grains Refined white flour and flour products such as bread, pasta, snack foods, and cereals. Avoid all processed foods. Meats and other proteins Fatty cuts of meat. Poultry with skin. Breaded or fried meats. Processed meat. Avoid saturated fats. Dairy Full-fat yogurt, cheese, or milk. Beverages Sweetened drinks, such as soda or iced tea. The items listed above may not be a complete list of foods and beverages you should avoid. Contact a dietitian for more information. Questions to ask a health care provider  Do I need to meet with a diabetes educator?  Do I need to meet with a dietitian?  What number can I call if I have questions?  When are the best times to check my blood glucose? Where to find more information:  American Diabetes Association: diabetes.org  Academy of Nutrition and Dietetics: www.eatright.org  National Institute of Diabetes and Digestive and Kidney Diseases: www.niddk.nih.gov  Association of Diabetes Care and Education Specialists: www.diabeteseducator.org Summary  It is important to have healthy eating   habits because your blood sugar (glucose) levels are greatly affected by what you eat and drink.  A healthy meal plan will help you control your blood glucose and maintain a healthy lifestyle.  Your health care provider may recommend that you work with a dietitian to make a meal plan that is best for you.  Keep in mind that carbohydrates (carbs) and alcohol have immediate effects on your blood glucose levels. It is important to count carbs and to use alcohol carefully. This information is not intended to replace advice given to you by your health care provider. Make sure you discuss any questions you have with your health care provider. Document Revised: 08/19/2019 Document Reviewed: 08/19/2019 Elsevier Patient Education  2021 Else[employer] Inc.  

## 2021-07-13 LAB — CBC WITH DIFFERENTIAL/PLATELET
Basophils Absolute: 0 10*3/uL (ref 0.0–0.2)
Basos: 0 %
EOS (ABSOLUTE): 0.4 10*3/uL (ref 0.0–0.4)
Eos: 7 %
Hematocrit: 40.5 % (ref 37.5–51.0)
Hemoglobin: 13 g/dL (ref 13.0–17.7)
Immature Grans (Abs): 0 10*3/uL (ref 0.0–0.1)
Immature Granulocytes: 0 %
Lymphocytes Absolute: 1.3 10*3/uL (ref 0.7–3.1)
Lymphs: 21 %
MCH: 29 pg (ref 26.6–33.0)
MCHC: 32.1 g/dL (ref 31.5–35.7)
MCV: 90 fL (ref 79–97)
Monocytes Absolute: 0.3 10*3/uL (ref 0.1–0.9)
Monocytes: 6 %
Neutrophils Absolute: 4 10*3/uL (ref 1.4–7.0)
Neutrophils: 66 %
Platelets: 172 10*3/uL (ref 150–450)
RBC: 4.48 x10E6/uL (ref 4.14–5.80)
RDW: 12.8 % (ref 11.6–15.4)
WBC: 6 10*3/uL (ref 3.4–10.8)

## 2021-07-13 LAB — CMP14+EGFR
ALT: 9 IU/L (ref 0–44)
AST: 12 IU/L (ref 0–40)
Albumin/Globulin Ratio: 1.8 (ref 1.2–2.2)
Albumin: 4.3 g/dL (ref 3.8–4.8)
Alkaline Phosphatase: 57 IU/L (ref 44–121)
BUN/Creatinine Ratio: 9 — ABNORMAL LOW (ref 10–24)
BUN: 11 mg/dL (ref 8–27)
Bilirubin Total: 1 mg/dL (ref 0.0–1.2)
CO2: 26 mmol/L (ref 20–29)
Calcium: 9.5 mg/dL (ref 8.6–10.2)
Chloride: 102 mmol/L (ref 96–106)
Creatinine, Ser: 1.17 mg/dL (ref 0.76–1.27)
Globulin, Total: 2.4 g/dL (ref 1.5–4.5)
Glucose: 176 mg/dL — ABNORMAL HIGH (ref 70–99)
Potassium: 5.1 mmol/L (ref 3.5–5.2)
Sodium: 142 mmol/L (ref 134–144)
Total Protein: 6.7 g/dL (ref 6.0–8.5)
eGFR: 68 mL/min/{1.73_m2} (ref >59)

## 2021-07-19 ENCOUNTER — Telehealth: Payer: Self-pay | Admitting: Physical Medicine and Rehabilitation

## 2021-07-19 DIAGNOSIS — M47816 Spondylosis without myelopathy or radiculopathy, lumbar region: Secondary | ICD-10-CM

## 2021-07-19 NOTE — Telephone Encounter (Signed)
Patient requests repeat bilateral L4-5 RFA-- last done in 2021. He reports that the pain is in the same area and he reports no new injuries. Ok to repeat?

## 2021-07-19 NOTE — Telephone Encounter (Signed)
Pt called requesting a call back to set an appt. Please call pt at [phone]

## 2021-07-20 NOTE — Telephone Encounter (Signed)
Referral placed.

## 2021-08-04 ENCOUNTER — Ambulatory Visit (INDEPENDENT_AMBULATORY_CARE_PROVIDER_SITE_OTHER): Payer: PPO

## 2021-08-04 VITALS — BP 112/70 | HR 79 | Ht 70.0 in | Wt 289.0 lb

## 2021-08-04 DIAGNOSIS — Z0000 Encounter for general adult medical examination without abnormal findings: Secondary | ICD-10-CM

## 2021-08-04 NOTE — Patient Instructions (Signed)
[name] , Thank you for taking time to come for your Medicare Wellness Visit. I appreciate your ongoing commitment to your health goals. Please review the following plan we discussed and let me know if I can assist you in the future.   Screening recommendations/referrals: Colonoscopy: done 12/09/2019 - Repeat in 10 years  Recommended yearly ophthalmology/optometry visit for glaucoma screening and checkup Recommended yearly dental visit for hygiene and checkup  Vaccinations: Influenza vaccine: Done 07/12/2021 - Repeat annually  Pneumococcal vaccine: Done 07/18/2016 & 02/26/2018 Tdap vaccine: due - every 10 years Shingles vaccine: Zostavax done in 2017 - due for shingrix - get next year   Covid-19: 10/31/2019, 11/28/2019, 07/31/2020, & 03/2021 - due for last booster (bivalent vaccine)  Advanced directives: Advance directive discussed with you today. Even though you declined this today, please call our office should you change your mind, and we can give you the proper paperwork for you to fill out.   Conditions/risks identified: Aim for 30 minutes of exercise or brisk walking each day, drink 6-8 glasses of water and eat lots of fruits and vegetables.   Next appointment: Follow up in one year for your annual wellness visit.   Preventive Care 68 Years and Older, Male  Preventive care refers to lifestyle choices and visits with your health care provider that can promote health and wellness. What does preventive care include? A yearly physical exam. This is also called an annual well check. Dental exams once or twice a year. Routine eye exams. Ask your health care provider how often you should have your eyes checked. Personal lifestyle choices, including: Daily care of your teeth and gums. Regular physical activity. Eating a healthy diet. Avoiding tobacco and drug use. Limiting alcohol use. Practicing safe sex. Taking low doses of aspirin every day. Taking vitamin and mineral supplements as  recommended by your health care provider. What happens during an annual well check? The services and screenings done by your health care provider during your annual well check will depend on your age, overall health, lifestyle risk factors, and family history of disease. Counseling  Your health care provider may ask you questions about your: Alcohol use. Tobacco use. Drug use. Emotional well-being. Home and relationship well-being. Sexual activity. Eating habits. History of falls. Memory and ability to understand (cognition). Work and work Statistician. Screening  You may have the following tests or measurements: Height, weight, and BMI. Blood pressure. Lipid and cholesterol levels. These may be checked every 5 years, or more frequently if you are over 48 years old. Skin check. Lung cancer screening. You may have this screening every year starting at age 49 if you have a 30-pack-year history of smoking and currently smoke or have quit within the past 15 years. Fecal occult blood test (FOBT) of the stool. You may have this test every year starting at age 79. Flexible sigmoidoscopy or colonoscopy. You may have a sigmoidoscopy every 5 years or a colonoscopy every 10 years starting at age 32. Prostate cancer screening. Recommendations will vary depending on your family history and other risks. Hepatitis C blood test. Hepatitis B blood test. Sexually transmitted disease (STD) testing. Diabetes screening. This is done by checking your blood sugar (glucose) after you have not eaten for a while (fasting). You may have this done every 1-3 years. Abdominal aortic aneurysm (AAA) screening. You may need this if you are a current or former smoker. Osteoporosis. You may be screened starting at age 44 if you are at high risk. Talk  with your health care provider about your test results, treatment options, and if necessary, the need for more tests. Vaccines  Your health care provider may recommend  certain vaccines, such as: Influenza vaccine. This is recommended every year. Tetanus, diphtheria, and acellular pertussis (Tdap, Td) vaccine. You may need a Td booster every 10 years. Zoster vaccine. You may need this after age 37. Pneumococcal 13-valent conjugate (PCV13) vaccine. One dose is recommended after age 27. Pneumococcal polysaccharide (PPSV23) vaccine. One dose is recommended after age 77. Talk to your health care provider about which screenings and vaccines you need and how often you need them. This information is not intended to replace advice given to you by your health care provider. Make sure you discuss any questions you have with your health care provider. Document Released: 10/08/2015 Document Revised: 05/31/2016 Document Reviewed: 07/13/2015 Elsevier Interactive Patient Education  2017 [place] Prevention in the Home Falls can cause injuries. They can happen to people of all ages. There are many things you can do to make your home safe and to help prevent falls. What can I do on the outside of my home? Regularly fix the edges of walkways and driveways and fix any cracks. Remove anything that might make you trip as you walk through a door, such as a raised step or threshold. Trim any bushes or trees on the path to your home. Use bright outdoor lighting. Clear any walking paths of anything that might make someone trip, such as rocks or tools. Regularly check to see if handrails are loose or broken. Make sure that both sides of any steps have handrails. Any raised decks and porches should have guardrails on the edges. Have any leaves, snow, or ice cleared regularly. Use sand or salt on walking paths during winter. Clean up any spills in your garage right away. This includes oil or grease spills. What can I do in the bathroom? Use night lights. Install grab bars by the toilet and in the tub and shower. Do not use towel bars as grab bars. Use non-skid mats or  decals in the tub or shower. If you need to sit down in the shower, use a plastic, non-slip stool. Keep the floor dry. Clean up any water that spills on the floor as soon as it happens. Remove soap buildup in the tub or shower regularly. Attach bath mats securely with double-sided non-slip rug tape. Do not have throw rugs and other things on the floor that can make you trip. What can I do in the bedroom? Use night lights. Make sure that you have a light by your bed that is easy to reach. Do not use any sheets or blankets that are too big for your bed. They should not hang down onto the floor. Have a firm chair that has side arms. You can use this for support while you get dressed. Do not have throw rugs and other things on the floor that can make you trip. What can I do in the kitchen? Clean up any spills right away. Avoid walking on wet floors. Keep items that you use a lot in easy-to-reach places. If you need to reach something above you, use a strong step stool that has a grab bar. Keep electrical cords out of the way. Do not use floor polish or wax that makes floors slippery. If you must use wax, use non-skid floor wax. Do not have throw rugs and other things on the floor that can make you  trip. What can I do with my stairs? Do not leave any items on the stairs. Make sure that there are handrails on both sides of the stairs and use them. Fix handrails that are broken or loose. Make sure that handrails are as long as the stairways. Check any carpeting to make sure that it is firmly attached to the stairs. Fix any carpet that is loose or worn. Avoid having throw rugs at the top or bottom of the stairs. If you do have throw rugs, attach them to the floor with carpet tape. Make sure that you have a light switch at the top of the stairs and the bottom of the stairs. If you do not have them, ask someone to add them for you. What else can I do to help prevent falls? Wear shoes that: Do not  have high heels. Have rubber bottoms. Are comfortable and fit you well. Are closed at the toe. Do not wear sandals. If you use a stepladder: Make sure that it is fully opened. Do not climb a closed stepladder. Make sure that both sides of the stepladder are locked into place. Ask someone to hold it for you, if possible. Clearly mark and make sure that you can see: Any grab bars or handrails. First and last steps. Where the edge of each step is. Use tools that help you move around (mobility aids) if they are needed. These include: Canes. Walkers. Scooters. Crutches. Turn on the lights when you go into a dark area. Replace any light bulbs as soon as they burn out. Set up your furniture so you have a clear path. Avoid moving your furniture around. If any of your floors are uneven, fix them. If there are any pets around you, be aware of where they are. Review your medicines with your doctor. Some medicines can make you feel dizzy. This can increase your chance of falling. Ask your doctor what other things that you can do to help prevent falls. This information is not intended to replace advice given to you by your health care provider. Make sure you discuss any questions you have with your health care provider. Document Released: 07/08/2009 Document Revised: 02/17/2016 Document Reviewed: 10/16/2014 Elsevier Interactive Patient Education  2017 [employer].
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
## 2021-08-23 ENCOUNTER — Other Ambulatory Visit: Payer: Self-pay

## 2021-08-23 ENCOUNTER — Encounter: Payer: Self-pay | Admitting: Physical Medicine and Rehabilitation

## 2021-08-23 ENCOUNTER — Ambulatory Visit: Payer: Self-pay

## 2021-08-23 ENCOUNTER — Ambulatory Visit (INDEPENDENT_AMBULATORY_CARE_PROVIDER_SITE_OTHER): Payer: PPO | Admitting: Physical Medicine and Rehabilitation

## 2021-08-23 VITALS — BP 121/80 | HR 86

## 2021-08-23 DIAGNOSIS — M47816 Spondylosis without myelopathy or radiculopathy, lumbar region: Secondary | ICD-10-CM | POA: Diagnosis not present

## 2021-08-23 MED ORDER — BETAMETHASONE SOD PHOS & ACET 6 (3-3) MG/ML IJ SUSP
12.0000 mg | Freq: Once | INTRAMUSCULAR | Status: AC
  Administered 2021-08-23: 16:00:00 12 mg

## 2021-08-23 NOTE — Progress Notes (Signed)
Pt state lower back pain. Pt state bending and sitting makes the pain worse. Pt state getting out of bed makes the pain worse. Pt state he takes pain meds to help ease his pain.  Numeric Pain Rating Scale and Functional Assessment Average Pain 6   In the last MONTH (on 0-10 scale) has pain interfered with the following?  1. General activity like being  able to carry out your everyday physical activities such as walking, climbing stairs, carrying groceries, or moving a chair?  Rating(9)   +Driver, -BT, -Dye Allergies.

## 2021-08-23 NOTE — Patient Instructions (Signed)

## 2021-08-30 ENCOUNTER — Other Ambulatory Visit: Payer: Self-pay

## 2021-08-30 ENCOUNTER — Ambulatory Visit: Payer: Self-pay

## 2021-08-30 ENCOUNTER — Ambulatory Visit: Payer: PPO | Admitting: Physical Medicine and Rehabilitation

## 2021-08-30 ENCOUNTER — Encounter: Payer: Self-pay | Admitting: Physical Medicine and Rehabilitation

## 2021-08-30 VITALS — BP 125/77 | HR 91

## 2021-08-30 DIAGNOSIS — M47816 Spondylosis without myelopathy or radiculopathy, lumbar region: Secondary | ICD-10-CM

## 2021-08-30 MED ORDER — METHYLPREDNISOLONE ACETATE 80 MG/ML IJ SUSP
80.0000 mg | Freq: Once | INTRAMUSCULAR | Status: AC
  Administered 2021-08-30: 16:00:00 80 mg

## 2021-08-30 NOTE — Progress Notes (Signed)
States last one helped the right side  Numeric Pain Rating Scale and Functional Assessment Average Pain 4   In the last MONTH (on 0-10 scale) has pain interfered with the following?  1. General activity like being  able to carry out your everyday physical activities such as walking, climbing stairs, carrying groceries, or moving a chair?  Rating(6)   +Driver, -BT, -Dye Allergies.

## 2021-09-11 NOTE — Procedures (Signed)
Lumbar Facet Joint Nerve Denervation  Patient: Adam Barrera      Date of Birth: 1951/06/21 MRN: [id number] PCP: Sharion Balloon, FNP      Visit Date: 08/23/2021   Universal Protocol:    Date/Time: 12/18/224:41 PM  Consent Given By: the patient  Position: PRONE  Additional Comments: Vital signs were monitored before and after the procedure. Patient was prepped and draped in the usual sterile fashion. The correct patient, procedure, and site was verified.   Injection Procedure Details:   Procedure diagnoses:  1. Spondylosis without myelopathy or radiculopathy, lumbar region      Meds Administered:  Meds ordered this encounter  Medications   betamethasone acetate-betamethasone sodium phosphate (CELESTONE) injection 12 mg     Laterality: Right  Location/Site:  L4-L5, L3 and L4 medial branches  Needle: 18 ga.,  98mm active tip, 192mm RF Cannula  Needle Placement: Along juncture of superior articular process and transverse pocess  Findings:  -Comments:  Procedure Details: For each desired target nerve, the corresponding transverse process (sacral ala for the L5 dorsal rami) was identified and the fluoroscope was positioned to square off the endplates of the corresponding vertebral body to achieve a true AP midline view.  The beam was then obliqued 15 to 20 degrees and caudally tilted 15 to 20 degrees to line up a trajectory along the target nerves. The skin over the target of the junction of superior articulating process and transverse process (sacral ala for the L5 dorsal rami) was infiltrated with 30ml of 1% Lidocaine without Epinephrine.  The 18 gauge 67mm active tip outer cannula was advanced in trajectory view to the target.  This procedure was repeated for each target nerve.  Then, for all levels, the outer cannula placement was fine-tuned and the position was then confirmed with bi-planar imaging.    Test stimulation was done both at sensory and motor levels to ensure  there was no radicular stimulation. The target tissues were then infiltrated with 1 ml of 1% Lidocaine without Epinephrine. Subsequently, a percutaneous neurotomy was carried out for 90 seconds at 80 degrees Celsius.  After the completion of the lesion, 1 ml of injectate was delivered. It was then repeated for each facet joint nerve mentioned above. Appropriate radiographs were obtained to verify the probe placement during the neurotomy.   Additional Comments:  The patient tolerated the procedure well Dressing: 2 x 2 sterile gauze and Band-Aid    Post-procedure details: Patient was observed during the procedure. Post-procedure instructions were reviewed.  Patient left the clinic in stable condition.

## 2021-09-11 NOTE — Procedures (Signed)
Lumbar Facet Joint Nerve Denervation  Patient: Adam Barrera      Date of Birth: 09/12/1951 MRN: [id number] PCP: Sharion Balloon, FNP      Visit Date: 08/30/2021   Universal Protocol:    Date/Time: 12/18/224:43 PM  Consent Given By: the patient  Position: PRONE  Additional Comments: Vital signs were monitored before and after the procedure. Patient was prepped and draped in the usual sterile fashion. The correct patient, procedure, and site was verified.   Injection Procedure Details:   Procedure diagnoses:  1. Spondylosis without myelopathy or radiculopathy, lumbar region      Meds Administered:  Meds ordered this encounter  Medications   methylPREDNISolone acetate (DEPO-MEDROL) injection 80 mg     Laterality: Left  Location/Site:  L4-L5, L3 and L4 medial branches  Needle: 18 ga.,  29mm active tip, 164mm RF Cannula  Needle Placement: Along juncture of superior articular process and transverse pocess  Findings:  -Comments:  Procedure Details: For each desired target nerve, the corresponding transverse process (sacral ala for the L5 dorsal rami) was identified and the fluoroscope was positioned to square off the endplates of the corresponding vertebral body to achieve a true AP midline view.  The beam was then obliqued 15 to 20 degrees and caudally tilted 15 to 20 degrees to line up a trajectory along the target nerves. The skin over the target of the junction of superior articulating process and transverse process (sacral ala for the L5 dorsal rami) was infiltrated with 86ml of 1% Lidocaine without Epinephrine.  The 18 gauge 33mm active tip outer cannula was advanced in trajectory view to the target.  This procedure was repeated for each target nerve.  Then, for all levels, the outer cannula placement was fine-tuned and the position was then confirmed with bi-planar imaging.    Test stimulation was done both at sensory and motor levels to ensure there was no radicular  stimulation. The target tissues were then infiltrated with 1 ml of 1% Lidocaine without Epinephrine. Subsequently, a percutaneous neurotomy was carried out for 90 seconds at 80 degrees Celsius.  After the completion of the lesion, 1 ml of injectate was delivered. It was then repeated for each facet joint nerve mentioned above. Appropriate radiographs were obtained to verify the probe placement during the neurotomy.   Additional Comments:  No complications occurred Dressing: 2 x 2 sterile gauze and Band-Aid    Post-procedure details: Patient was observed during the procedure. Post-procedure instructions were reviewed.  Patient left the clinic in stable condition.

## 2021-09-11 NOTE — Progress Notes (Signed)
Adam Barrera - 70 y.o. male MRN [id number]  Date of birth: 05/10/1951  Office Visit Note: Visit Date: 08/23/2021 PCP: Sharion Balloon, FNP Referred by: Sharion Balloon, FNP  Subjective: Chief Complaint  Patient presents with   Lower Back - Pain   HPI:  Adam Barrera is a 70 y.o. male who comes in todayfor planned repeat radiofrequency ablation of the Right L4-5 and L4-L5  Lumbar facet joints. This would be ablation of the corresponding medial branches and/or dorsal rami.  Patient has had double diagnostic blocks with more than 70% relief.  Subsequent ablation gave them more than 6 months of over 60% relief.  They have had chronic back pain for quite some time, more than 3 months, which has been an ongoing situation with recalcitrant axial back pain.  They have no radicular pain.  Their axial pain is worse with standing and ambulating and on exam today with facet loading.  They have had physical therapy as well as home exercise program.  The imaging noted in the chart below indicated facet pathology. Accordingly they meet all the criteria and qualification for for radiofrequency ablation and we are going to complete this today hopefully for more longer term relief as part of comprehensive management program.   ROS Otherwise per HPI.  Assessment & Plan: Visit Diagnoses:    ICD-10-CM   1. Spondylosis without myelopathy or radiculopathy, lumbar region  M47.816 XR C-ARM NO REPORT    Radiofrequency,Lumbar    betamethasone acetate-betamethasone sodium phosphate (CELESTONE) injection 12 mg      Plan: No additional findings.   Meds & Orders:  Meds ordered this encounter  Medications   betamethasone acetate-betamethasone sodium phosphate (CELESTONE) injection 12 mg    Orders Placed This Encounter  Procedures   Radiofrequency,Lumbar   XR C-ARM NO REPORT    Follow-up: Return if symptoms worsen or fail to improve.   Procedures: No procedures performed  Lumbar Facet Joint Nerve  Denervation  Patient: Adam Barrera      Date of Birth: 10-Sep-1951 MRN: [id number] PCP: Sharion Balloon, FNP      Visit Date: 08/23/2021   Universal Protocol:    Date/Time: 12/18/224:41 PM  Consent Given By: the patient  Position: PRONE  Additional Comments: Vital signs were monitored before and after the procedure. Patient was prepped and draped in the usual sterile fashion. The correct patient, procedure, and site was verified.   Injection Procedure Details:   Procedure diagnoses:  1. Spondylosis without myelopathy or radiculopathy, lumbar region      Meds Administered:  Meds ordered this encounter  Medications   betamethasone acetate-betamethasone sodium phosphate (CELESTONE) injection 12 mg     Laterality: Right  Location/Site:  L4-L5, L3 and L4 medial branches  Needle: 18 ga.,  49mm active tip, 180mm RF Cannula  Needle Placement: Along juncture of superior articular process and transverse pocess  Findings:  -Comments:  Procedure Details: For each desired target nerve, the corresponding transverse process (sacral ala for the L5 dorsal rami) was identified and the fluoroscope was positioned to square off the endplates of the corresponding vertebral body to achieve a true AP midline view.  The beam was then obliqued 15 to 20 degrees and caudally tilted 15 to 20 degrees to line up a trajectory along the target nerves. The skin over the target of the junction of superior articulating process and transverse process (sacral ala for the L5 dorsal rami) was infiltrated with 6ml of 1% Lidocaine without  Epinephrine.  The 18 gauge 24mm active tip outer cannula was advanced in trajectory view to the target.  This procedure was repeated for each target nerve.  Then, for all levels, the outer cannula placement was fine-tuned and the position was then confirmed with bi-planar imaging.    Test stimulation was done both at sensory and motor levels to ensure there was no radicular  stimulation. The target tissues were then infiltrated with 1 ml of 1% Lidocaine without Epinephrine. Subsequently, a percutaneous neurotomy was carried out for 90 seconds at 80 degrees Celsius.  After the completion of the lesion, 1 ml of injectate was delivered. It was then repeated for each facet joint nerve mentioned above. Appropriate radiographs were obtained to verify the probe placement during the neurotomy.   Additional Comments:  The patient tolerated the procedure well Dressing: 2 x 2 sterile gauze and Band-Aid    Post-procedure details: Patient was observed during the procedure. Post-procedure instructions were reviewed.  Patient left the clinic in stable condition.       Clinical History: MRI LUMBAR SPINE WITHOUT CONTRAST July/2012    Technique: Multiplanar and multiecho pulse sequences of the lumbar  spine were obtained without intravenous contrast.     Comparison: None.     Findings: Five lumbar type vertebral bodies are assumed. There is  a 2 mm in degenerative anterolisthesis of L4 on L5. The alignment  is otherwise normal. The lumbar pedicles are relatively short on a  congenital basis. There is no evidence of acute fracture or pars  defect.     The conus medullaris extends to the L1-L2 level and appears normal.  There are no paraspinal abnormalities.     There are no significant disc space findings from T11-T12 through  L2-L3.     L3-L4: Mild disc bulge with mild facet and ligamentous  hypertrophy. No spinal stenosis or nerve root encroachment.     L4-L5: There is annular disc bulging with a shallow left  paracentral disc protrusion. Moderate facet degenerative changes  are present bilaterally accounting for the slight anterolisthesis.  These factors contribute to mild central, left greater than right  lateral recess and right greater than left foraminal stenosis.     L5-S1: There is disc bulging with mild bilateral facet  hypertrophy. No significant  resulting spinal stenosis or nerve  root encroachment results.     IMPRESSION:     1. Mild multifactorial spinal stenosis at L4-L5 with lateral  recess and foraminal stenosis bilaterally.  2. Mild disc bulging and facet disease at L3-L4 and L5-S1 without  resulting spinal stenosis or nerve root encroachment.     Objective:  VS:  HT:     WT:    BMI:      BP:121/80   HR:86bpm   TEMP: ( )   RESP:  Physical Exam Vitals and nursing note reviewed.  Constitutional:      General: He is not in acute distress.    Appearance: Normal appearance. He is not ill-appearing.  HENT:     Head: Normocephalic and atraumatic.     Right Ear: External ear normal.     Left Ear: External ear normal.     Nose: No congestion.  Eyes:     Extraocular Movements: Extraocular movements intact.  Cardiovascular:     Rate and Rhythm: Normal rate.     Pulses: Normal pulses.  Pulmonary:     Effort: Pulmonary effort is normal. No respiratory distress.  Abdominal:  General: There is no distension.     Palpations: Abdomen is soft.  Musculoskeletal:        General: No tenderness or signs of injury.     Cervical back: Neck supple.     Right lower leg: No edema.     Left lower leg: No edema.     Comments: Patient has good distal strength without clonus. Patient somewhat slow to rise from a seated position to full extension.  There is concordant low back pain with facet loading and lumbar spine extension rotation.  There are no definitive trigger points but the patient is somewhat tender across the lower back and PSIS.  There is no pain with hip rotation.   Skin:    Findings: No erythema or rash.  Neurological:     General: No focal deficit present.     Mental Status: He is alert and oriented to person, place, and time.     Sensory: No sensory deficit.     Motor: No weakness or abnormal muscle tone.     Coordination: Coordination normal.  Psychiatric:        Mood and Affect: Mood normal.        Behavior:  Behavior normal.     Imaging: No results found.

## 2021-09-11 NOTE — Progress Notes (Signed)
Adam Barrera - 70 y.o. male MRN [id number]  Date of birth: Feb 15, 1951  Office Visit Note: Visit Date: 08/30/2021 PCP: Sharion Balloon, FNP Referred by: Sharion Balloon, FNP  Subjective: Chief Complaint  Patient presents with   Lower Back - Pain   HPI:  Adam Barrera is a 70 y.o. male who comes in todayfor planned repeat radiofrequency ablation of the Left L4-5 and L4-L5  Lumbar facet joints. This would be ablation of the corresponding medial branches and/or dorsal rami.  Patient has had double diagnostic blocks with more than 70% relief.  Subsequent ablation gave them more than 6 months of over 60% relief.  They have had chronic back pain for quite some time, more than 3 months, which has been an ongoing situation with recalcitrant axial back pain.  They have no radicular pain.  Their axial pain is worse with standing and ambulating and on exam today with facet loading.  They have had physical therapy as well as home exercise program.  The imaging noted in the chart below indicated facet pathology. Accordingly they meet all the criteria and qualification for for radiofrequency ablation and we are going to complete this today hopefully for more longer term relief as part of comprehensive management program.   ROS Otherwise per HPI.  Assessment & Plan: Visit Diagnoses:    ICD-10-CM   1. Spondylosis without myelopathy or radiculopathy, lumbar region  M47.816 XR C-ARM NO REPORT    Radiofrequency,Lumbar    methylPREDNISolone acetate (DEPO-MEDROL) injection 80 mg      Plan: No additional findings.   Meds & Orders:  Meds ordered this encounter  Medications   methylPREDNISolone acetate (DEPO-MEDROL) injection 80 mg    Orders Placed This Encounter  Procedures   Radiofrequency,Lumbar   XR C-ARM NO REPORT    Follow-up: No follow-ups on file.   Procedures: No procedures performed      Clinical History: MRI LUMBAR SPINE WITHOUT CONTRAST July/2012    Technique: Multiplanar and  multiecho pulse sequences of the lumbar  spine were obtained without intravenous contrast.     Comparison: None.     Findings: Five lumbar type vertebral bodies are assumed. There is  a 2 mm in degenerative anterolisthesis of L4 on L5. The alignment  is otherwise normal. The lumbar pedicles are relatively short on a  congenital basis. There is no evidence of acute fracture or pars  defect.     The conus medullaris extends to the L1-L2 level and appears normal.  There are no paraspinal abnormalities.     There are no significant disc space findings from T11-T12 through  L2-L3.     L3-L4: Mild disc bulge with mild facet and ligamentous  hypertrophy. No spinal stenosis or nerve root encroachment.     L4-L5: There is annular disc bulging with a shallow left  paracentral disc protrusion. Moderate facet degenerative changes  are present bilaterally accounting for the slight anterolisthesis.  These factors contribute to mild central, left greater than right  lateral recess and right greater than left foraminal stenosis.     L5-S1: There is disc bulging with mild bilateral facet  hypertrophy. No significant resulting spinal stenosis or nerve  root encroachment results.     IMPRESSION:     1. Mild multifactorial spinal stenosis at L4-L5 with lateral  recess and foraminal stenosis bilaterally.  2. Mild disc bulging and facet disease at L3-L4 and L5-S1 without  resulting spinal stenosis or nerve root encroachment.  Objective:  VS:  HT:     WT:    BMI:      BP:125/77   HR:91bpm   TEMP: ( )   RESP:  Physical Exam Vitals and nursing note reviewed.  Constitutional:      General: He is not in acute distress.    Appearance: Normal appearance. He is obese. He is not ill-appearing.  HENT:     Head: Normocephalic and atraumatic.     Right Ear: External ear normal.     Left Ear: External ear normal.     Nose: No congestion.  Eyes:     Extraocular Movements: Extraocular movements  intact.  Cardiovascular:     Rate and Rhythm: Normal rate.     Pulses: Normal pulses.  Pulmonary:     Effort: Pulmonary effort is normal. No respiratory distress.  Abdominal:     General: There is no distension.     Palpations: Abdomen is soft.  Musculoskeletal:        General: No tenderness or signs of injury.     Cervical back: Neck supple.     Right lower leg: No edema.     Left lower leg: No edema.     Comments: Patient has good distal strength without clonus. Patient somewhat slow to rise from a seated position to full extension.  There is concordant low back pain with facet loading and lumbar spine extension rotation.  There are no definitive trigger points but the patient is somewhat tender across the lower back and PSIS.  There is no pain with hip rotation.   Skin:    Findings: No erythema or rash.  Neurological:     General: No focal deficit present.     Mental Status: He is alert and oriented to person, place, and time.     Sensory: No sensory deficit.     Motor: No weakness or abnormal muscle tone.     Coordination: Coordination normal.  Psychiatric:        Mood and Affect: Mood normal.        Behavior: Behavior normal.     Imaging: No results found.

## 2021-11-10 DIAGNOSIS — Z23 Encounter for immunization: Secondary | ICD-10-CM | POA: Diagnosis not present

## 2022-01-06 ENCOUNTER — Other Ambulatory Visit: Payer: Self-pay | Admitting: Family

## 2022-01-06 DIAGNOSIS — J301 Allergic rhinitis due to pollen: Secondary | ICD-10-CM

## 2022-04-07 ENCOUNTER — Other Ambulatory Visit: Payer: Self-pay | Admitting: Family

## 2022-04-07 DIAGNOSIS — J301 Allergic rhinitis due to pollen: Secondary | ICD-10-CM

## 2022-04-18 ENCOUNTER — Ambulatory Visit: Payer: PPO | Admitting: Family

## 2022-04-25 ENCOUNTER — Ambulatory Visit (INDEPENDENT_AMBULATORY_CARE_PROVIDER_SITE_OTHER): Payer: PPO | Admitting: Family

## 2022-04-25 ENCOUNTER — Encounter: Payer: Self-pay | Admitting: Family

## 2022-04-25 VITALS — BP 131/68 | HR 57 | Temp 97.9°F | Ht 70.0 in | Wt 283.0 lb

## 2022-04-25 DIAGNOSIS — N3281 Overactive bladder: Secondary | ICD-10-CM

## 2022-04-25 DIAGNOSIS — N401 Enlarged prostate with lower urinary tract symptoms: Secondary | ICD-10-CM

## 2022-04-25 DIAGNOSIS — E1159 Type 2 diabetes mellitus with other circulatory complications: Secondary | ICD-10-CM

## 2022-04-25 DIAGNOSIS — E785 Hyperlipidemia, unspecified: Secondary | ICD-10-CM | POA: Diagnosis not present

## 2022-04-25 DIAGNOSIS — R918 Other nonspecific abnormal finding of lung field: Secondary | ICD-10-CM | POA: Diagnosis not present

## 2022-04-25 DIAGNOSIS — N183 Chronic kidney disease, stage 3 unspecified: Secondary | ICD-10-CM | POA: Diagnosis not present

## 2022-04-25 DIAGNOSIS — Z0000 Encounter for general adult medical examination without abnormal findings: Secondary | ICD-10-CM

## 2022-04-25 DIAGNOSIS — I700 Atherosclerosis of aorta: Secondary | ICD-10-CM | POA: Diagnosis not present

## 2022-04-25 DIAGNOSIS — Z23 Encounter for immunization: Secondary | ICD-10-CM

## 2022-04-25 DIAGNOSIS — E1169 Type 2 diabetes mellitus with other specified complication: Secondary | ICD-10-CM

## 2022-04-25 DIAGNOSIS — J61 Pneumoconiosis due to asbestos and other mineral fibers: Secondary | ICD-10-CM

## 2022-04-25 DIAGNOSIS — Z125 Encounter for screening for malignant neoplasm of prostate: Secondary | ICD-10-CM | POA: Diagnosis not present

## 2022-04-25 DIAGNOSIS — Z0001 Encounter for general adult medical examination with abnormal findings: Secondary | ICD-10-CM

## 2022-04-25 DIAGNOSIS — I152 Hypertension secondary to endocrine disorders: Secondary | ICD-10-CM | POA: Diagnosis not present

## 2022-04-25 DIAGNOSIS — M1712 Unilateral primary osteoarthritis, left knee: Secondary | ICD-10-CM | POA: Diagnosis not present

## 2022-04-25 DIAGNOSIS — I10 Essential (primary) hypertension: Secondary | ICD-10-CM | POA: Diagnosis not present

## 2022-04-25 DIAGNOSIS — M19019 Primary osteoarthritis, unspecified shoulder: Secondary | ICD-10-CM

## 2022-04-25 DIAGNOSIS — M1711 Unilateral primary osteoarthritis, right knee: Secondary | ICD-10-CM

## 2022-04-25 DIAGNOSIS — R350 Frequency of micturition: Secondary | ICD-10-CM | POA: Insufficient documentation

## 2022-04-25 MED ORDER — LOSARTAN POTASSIUM 100 MG PO TABS: 100.0000 mg | ORAL_TABLET | Freq: Every day | ORAL | 2 refills | Status: DC

## 2022-04-25 MED ORDER — TAMSULOSIN HCL 0.4 MG PO CAPS: 0.4000 mg | ORAL_CAPSULE | Freq: Every day | ORAL | 3 refills | Status: DC

## 2022-04-25 MED ORDER — OXYBUTYNIN CHLORIDE ER 10 MG PO TB24: 10.0000 mg | ORAL_TABLET | Freq: Every day | ORAL | 3 refills | Status: DC

## 2022-04-25 MED ORDER — METFORMIN HCL 1000 MG PO TABS: 1000.0000 mg | ORAL_TABLET | Freq: Two times a day (BID) | ORAL | 3 refills | Status: DC

## 2022-04-25 MED ORDER — ROSUVASTATIN CALCIUM 20 MG PO TABS: 20.0000 mg | ORAL_TABLET | Freq: Every day | ORAL | 3 refills | Status: DC

## 2022-04-25 NOTE — Progress Notes (Signed)
Subjective:    Patient ID: Adam Barrera, male    DOB: 31-Jul-1951, 71 y.o.   MRN: [id number]  Chief Complaint  Patient presents with   Medical Management of Chronic Issues   Pt presents to the office today for CPE and chronic follow up.  He was followed by Pulmonologist for asbestosis and pulmonary nodules,  but has not seen them in several years.   Has CKD and tries to avoid NSAID's.   He is morbid obese with a BMI of 40. Has aortic atherosclerosis and takes Crestor daily. Diabetes He presents for his follow-up diabetic visit. He has type 2 diabetes mellitus. There are no hypoglycemic associated symptoms. Associated symptoms include foot paresthesias. Pertinent negatives for diabetes include no blurred vision. Diabetic complications include peripheral neuropathy. Pertinent negatives for diabetic complications include no CVA. Risk factors for coronary artery disease include hypertension, male sex, diabetes mellitus and dyslipidemia. His overall blood glucose range is 130-140 mg/dl. Eye exam is not current.  Hypertension This is a chronic problem. The current episode started more than 1 year ago. The problem has been resolved since onset. Associated symptoms include malaise/fatigue and peripheral edema. Pertinent negatives include no blurred vision or shortness of breath. Risk factors for coronary artery disease include dyslipidemia, diabetes mellitus, obesity and sedentary lifestyle. The current treatment provides moderate improvement. There is no history of CVA or heart failure.  Hyperlipidemia This is a chronic problem. The current episode started more than 1 year ago. The problem is controlled. Recent lipid tests were reviewed and are normal. Exacerbating diseases include obesity. Pertinent negatives include no shortness of breath. Current antihyperlipidemic treatment includes statins. The current treatment provides moderate improvement of lipids. Risk factors for coronary artery disease  include dyslipidemia, diabetes mellitus, male sex, hypertension and a sedentary lifestyle.  Arthritis Presents for follow-up visit. He complains of pain and stiffness. The symptoms have been stable. Affected locations include the left knee, right knee, right MCP, left MCP and left shoulder. His pain is at a severity of 6/10.  Urinary Frequency  This is a chronic problem. The current episode started more than 1 year ago. The problem has been waxing and waning. Associated symptoms include frequency and urgency.  Benign Prostatic Hypertrophy This is a chronic problem. The current episode started more than 1 year ago. Irritative symptoms include frequency, nocturia (4) and urgency. Obstructive symptoms include incomplete emptying, straining and a weak stream.      Review of Systems  Constitutional:  Positive for malaise/fatigue.  Eyes:  Negative for blurred vision.  Respiratory:  Negative for shortness of breath.   Genitourinary:  Positive for frequency, incomplete emptying, nocturia (4) and urgency.  Musculoskeletal:  Positive for arthritis and stiffness.  All other systems reviewed and are negative.   Family History  Problem Relation Age of Onset   Prostate cancer Other        family history    Prostate cancer Father    Colon cancer Father    Cancer Father    Hypertension Father    Diabetes Mother    Stroke Mother    Alzheimer's disease Mother    Hypertension Mother    Stomach cancer Brother 22   Cancer Brother    Colon cancer Brother 78   Social History   Socioeconomic History   Marital status: Married    Spouse name: Adonis Huguenin   Number of children: 2   Years of education: 14   Highest education level: Associate degree: occupational, Hotel manager, or  vocational program  Occupational History   Occupation: Retired from [employer]   Smoking status: Former    Packs/day: 1.00    Years: 30.00    Total pack years: 30.00    Types: Cigarettes    Quit date: 07/29/2015     Years since quitting: 6.7   Smokeless tobacco: Never  Vaping Use   Vaping Use: Never used  Substance and Sexual Activity   Alcohol use: No   Drug use: No   Sexual activity: Yes    Birth control/protection: None  Other Topics Concern   Not on file  Social History Narrative   Lives in 2 story home with wife - daughter lives with them   Involved in church - enjoys playing golf 2-3 times per week   Social Determinants of Health   Financial Resource Strain: Low Risk  (08/04/2021)   Overall Financial Resource Strain (CARDIA)    Difficulty of Paying Living Expenses: Not hard at all  Food Insecurity: No Food Insecurity (08/04/2021)   Hunger Vital Sign    Worried About Running Out of Food in the Last Year: Never true    [place] in the Last Year: Never true  Transportation Needs: No Transportation Needs (08/04/2021)   PRAPARE - Hydrologist (Medical): No    Lack of Transportation (Non-Medical): No  Physical Activity: Sufficiently Active (08/04/2021)   Exercise Vital Sign    Days of Exercise per Week: 3 days    Minutes of Exercise per Session: 60 min  Stress: No Stress Concern Present (08/04/2021)   [place]    Feeling of Stress : Not at all  Social Connections: [place] (08/04/2021)   Social Connection and Isolation Panel [NHANES]    Frequency of Communication with Friends and Family: More than three times a week    Frequency of Social Gatherings with Friends and Family: More than three times a week    Attends Religious Services: More than 4 times per year    Active Member of [employer] or Organizations: Yes    Attends Music therapist: More than 4 times per year    Marital Status: Married       Objective:   Physical Exam Vitals reviewed.  Constitutional:      General: He is not in acute distress.    Appearance: He is well-developed. He is obese.   HENT:     Head: Normocephalic.     Right Ear: Tympanic membrane normal.     Left Ear: Tympanic membrane normal.  Eyes:     General:        Right eye: No discharge.        Left eye: No discharge.     Pupils: Pupils are equal, round, and reactive to light.  Neck:     Thyroid: No thyromegaly.  Cardiovascular:     Rate and Rhythm: Normal rate and regular rhythm.     Heart sounds: Normal heart sounds. No murmur heard. Pulmonary:     Effort: Pulmonary effort is normal. No respiratory distress.     Breath sounds: Normal breath sounds. No wheezing.  Abdominal:     General: Bowel sounds are normal. There is no distension.     Palpations: Abdomen is soft.     Tenderness: There is no abdominal tenderness.  Musculoskeletal:        General: No tenderness. Normal range of motion.  Cervical back: Normal range of motion and neck supple.     Right lower leg: Edema (trace) present.     Left lower leg: Edema (trace) present.  Skin:    General: Skin is warm and dry.     Findings: No erythema or rash.  Neurological:     Mental Status: He is alert and oriented to person, place, and time.     Cranial Nerves: No cranial nerve deficit.     Deep Tendon Reflexes: Reflexes are normal and symmetric.  Psychiatric:        Behavior: Behavior normal.        Thought Content: Thought content normal.        Judgment: Judgment normal.       BP 131/68   Pulse (!) 57   Temp 97.9 F (36.6 C) (Temporal)   Ht '5\' 10"'  (1.778 m)   Wt 283 lb (128.4 kg)   SpO2 95%   BMI 40.61 kg/m      Assessment & Plan:  Adam Barrera comes in today with chief complaint of Medical Management of Chronic Issues   Diagnosis and orders addressed:  1. Aortic atherosclerosis (HCC) - rosuvastatin (CRESTOR) 20 MG tablet; Take 1 tablet (20 mg total) by mouth daily.  Dispense: 90 tablet; Refill: 3 - CMP14+EGFR - CBC with Differential/Platelet  2. Hyperlipidemia associated with type 2 diabetes mellitus (HCC) -  rosuvastatin (CRESTOR) 20 MG tablet; Take 1 tablet (20 mg total) by mouth daily.  Dispense: 90 tablet; Refill: 3 - CMP14+EGFR - CBC with Differential/Platelet  3. Essential hypertension, benign - losartan (COZAAR) 100 MG tablet; Take 1 tablet (100 mg total) by mouth daily.  Dispense: 90 tablet; Refill: 2 - CMP14+EGFR - CBC with Differential/Platelet  4. OAB (overactive bladder) - oxybutynin (DITROPAN-XL) 10 MG 24 hr tablet; Take 1 tablet (10 mg total) by mouth at bedtime.  Dispense: 90 tablet; Refill: 3 - CMP14+EGFR - CBC with Differential/Platelet  5. Type 2 diabetes mellitus with other specified complication, without long-term current use of insulin (HCC) - metFORMIN (GLUCOPHAGE) 1000 MG tablet; Take 1 tablet (1,000 mg total) by mouth 2 (two) times daily.  Dispense: 180 tablet; Refill: 3 - CMP14+EGFR - CBC with Differential/Platelet - Microalbumin / creatinine urine ratio  6. Multiple pulmonary nodules - CMP14+EGFR - CBC with Differential/Platelet  7. Asbestosis ([place]) - CMP14+EGFR - CBC with Differential/Platelet  8. Hypertension associated with diabetes ([place]) - CMP14+EGFR - CBC with Differential/Platelet  9. Primary osteoarthritis of left knee - CMP14+EGFR - CBC with Differential/Platelet  10. Unilateral primary osteoarthritis, right knee - CMP14+EGFR - CBC with Differential/Platelet  11. Shoulder arthritis - CMP14+EGFR - CBC with Differential/Platelet  12. Stage 3 chronic kidney disease, unspecified whether stage 3a or 3b CKD (HCC) - CMP14+EGFR - CBC with Differential/Platelet  13. Annual physical exam - CMP14+EGFR - CBC with Differential/Platelet - Lipid panel - PSA, total and free - TSH   14. Benign prostatic hyperplasia with urinary frequency Will start Flomax today If symptoms continue will need referral to Urologists  - tamsulosin (FLOMAX) 0.4 MG CAPS capsule; Take 1 capsule (0.4 mg total) by mouth daily.  Dispense: 90 capsule; Refill: 3   Labs  pending Health Maintenance reviewed Diet and exercise encouraged  Follow up plan: 6 months   Evelina Dun, FNP

## 2022-04-25 NOTE — Patient Instructions (Signed)

## 2022-04-26 LAB — CMP14+EGFR
ALT: 11 IU/L (ref 0–44)
AST: 13 IU/L (ref 0–40)
Albumin/Globulin Ratio: 1.6 (ref 1.2–2.2)
Albumin: 4.2 g/dL (ref 3.9–4.9)
Alkaline Phosphatase: 62 IU/L (ref 44–121)
BUN/Creatinine Ratio: 8 — ABNORMAL LOW (ref 10–24)
BUN: 10 mg/dL (ref 8–27)
Bilirubin Total: 1.1 mg/dL (ref 0.0–1.2)
CO2: 25 mmol/L (ref 20–29)
Calcium: 9.9 mg/dL (ref 8.6–10.2)
Chloride: 102 mmol/L (ref 96–106)
Creatinine, Ser: 1.21 mg/dL (ref 0.76–1.27)
Globulin, Total: 2.6 g/dL (ref 1.5–4.5)
Glucose: 238 mg/dL — ABNORMAL HIGH (ref 70–99)
Potassium: 5.2 mmol/L (ref 3.5–5.2)
Sodium: 141 mmol/L (ref 134–144)
Total Protein: 6.8 g/dL (ref 6.0–8.5)
eGFR: 65 mL/min/{1.73_m2} (ref >59)

## 2022-04-26 LAB — CBC WITH DIFFERENTIAL/PLATELET
Basophils Absolute: 0 10*3/uL (ref 0.0–0.2)
Basos: 0 %
EOS (ABSOLUTE): 0.3 10*3/uL (ref 0.0–0.4)
Eos: 5 %
Hematocrit: 39.4 % (ref 37.5–51.0)
Hemoglobin: 13 g/dL (ref 13.0–17.7)
Immature Grans (Abs): 0 10*3/uL (ref 0.0–0.1)
Immature Granulocytes: 0 %
Lymphocytes Absolute: 1.6 10*3/uL (ref 0.7–3.1)
Lymphs: 27 %
MCH: 30.1 pg (ref 26.6–33.0)
MCHC: 33 g/dL (ref 31.5–35.7)
MCV: 91 fL (ref 79–97)
Monocytes Absolute: 0.3 10*3/uL (ref 0.1–0.9)
Monocytes: 5 %
Neutrophils Absolute: 3.7 10*3/uL (ref 1.4–7.0)
Neutrophils: 63 %
Platelets: 172 10*3/uL (ref 150–450)
RBC: 4.32 x10E6/uL (ref 4.14–5.80)
RDW: 13.1 % (ref 11.6–15.4)
WBC: 5.9 10*3/uL (ref 3.4–10.8)

## 2022-04-26 LAB — LIPID PANEL
Chol/HDL Ratio: 2.9 ratio (ref 0.0–5.0)
Cholesterol, Total: 88 mg/dL — ABNORMAL LOW (ref 100–199)
HDL: 30 mg/dL — ABNORMAL LOW (ref >39)
LDL Chol Calc (NIH): 36 mg/dL (ref 0–99)
Triglycerides: 119 mg/dL (ref 0–149)
VLDL Cholesterol Cal: 22 mg/dL (ref 5–40)

## 2022-04-26 LAB — PSA, TOTAL AND FREE
PSA, Free Pct: 46.7 %
PSA, Free: 0.28 ng/mL
Prostate Specific Ag, Serum: 0.6 ng/mL (ref 0.0–4.0)

## 2022-04-26 LAB — MICROALBUMIN / CREATININE URINE RATIO
Creatinine, Urine: 122.2 mg/dL
Microalb/Creat Ratio: 119 mg/g creat — ABNORMAL HIGH (ref 0–29)
Microalbumin, Urine: 145.8 ug/mL

## 2022-04-26 LAB — TSH: TSH: 1.58 u[IU]/mL (ref 0.450–4.500)

## 2022-05-19 DIAGNOSIS — H35033 Hypertensive retinopathy, bilateral: Secondary | ICD-10-CM | POA: Diagnosis not present

## 2022-05-19 LAB — HM DIABETES EYE EXAM

## 2022-06-22 ENCOUNTER — Telehealth: Payer: Self-pay | Admitting: Physical Medicine and Rehabilitation

## 2022-06-22 NOTE — Telephone Encounter (Signed)
Patient called asking for an appointment to get an injection in his back asap. He's having a lot of discomfort and pain. CB # [phone]

## 2022-06-23 ENCOUNTER — Other Ambulatory Visit: Payer: Self-pay | Admitting: Physical Medicine and Rehabilitation

## 2022-06-23 DIAGNOSIS — M5416 Radiculopathy, lumbar region: Secondary | ICD-10-CM

## 2022-06-23 NOTE — Progress Notes (Signed)
Patient reports no relief with previous lumbar radiofrequency ablation procedure in December of 2022, he states worsening pain and radicular symptoms to right leg. Last MRI imaging of lumbar spine was 2012. We will place order to obtain new imaging.

## 2022-06-23 NOTE — Telephone Encounter (Signed)
I called patient and advised. 

## 2022-06-23 NOTE — Telephone Encounter (Signed)
I called patient. He had RFA L4-5, L3 and L4 medial branches on 08/30/2021 with no relief. He states that he is having excessive pain in the center of his spine that radiates down the back of the right leg to the knee. He also has burning in his right thigh. He is unsure what he exactly needs but is requesting some type of injection.    Could either of you advise? OV?

## 2022-07-02 ENCOUNTER — Other Ambulatory Visit: Payer: Self-pay | Admitting: Family

## 2022-07-02 DIAGNOSIS — J301 Allergic rhinitis due to pollen: Secondary | ICD-10-CM

## 2022-07-18 ENCOUNTER — Ambulatory Visit (HOSPITAL_COMMUNITY)
Admission: RE | Admit: 2022-07-18 | Discharge: 2022-07-18 | Disposition: A | Discharge status: Home / Self Care | Payer: PPO | Source: Ambulatory Visit | Attending: Physical Medicine and Rehabilitation | Admitting: Physical Medicine and Rehabilitation

## 2022-07-18 DIAGNOSIS — M4316 Spondylolisthesis, lumbar region: Secondary | ICD-10-CM | POA: Diagnosis not present

## 2022-07-18 DIAGNOSIS — M5416 Radiculopathy, lumbar region: Secondary | ICD-10-CM | POA: Diagnosis not present

## 2022-07-18 DIAGNOSIS — M545 Low back pain, unspecified: Secondary | ICD-10-CM | POA: Diagnosis not present

## 2022-07-18 DIAGNOSIS — M5136 Other intervertebral disc degeneration, lumbar region: Secondary | ICD-10-CM | POA: Diagnosis not present

## 2022-07-20 ENCOUNTER — Encounter: Payer: Self-pay | Admitting: Physical Medicine and Rehabilitation

## 2022-07-20 ENCOUNTER — Ambulatory Visit: Payer: PPO | Admitting: Physical Medicine and Rehabilitation

## 2022-07-20 DIAGNOSIS — M5416 Radiculopathy, lumbar region: Secondary | ICD-10-CM | POA: Diagnosis not present

## 2022-07-20 DIAGNOSIS — M47816 Spondylosis without myelopathy or radiculopathy, lumbar region: Secondary | ICD-10-CM

## 2022-07-20 DIAGNOSIS — M48062 Spinal stenosis, lumbar region with neurogenic claudication: Secondary | ICD-10-CM | POA: Diagnosis not present

## 2022-07-20 DIAGNOSIS — M4726 Other spondylosis with radiculopathy, lumbar region: Secondary | ICD-10-CM

## 2022-07-20 MED ORDER — METHOCARBAMOL 500 MG PO TABS: 500.0000 mg | ORAL_TABLET | Freq: Three times a day (TID) | ORAL | 0 refills | Status: DC
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
## 2022-07-20 NOTE — Progress Notes (Signed)
Numeric Pain Rating Scale and Functional Assessment Average Pain 8   In the last MONTH (on 0-10 scale) has pain interfered with the following?  1. General activity like being  able to carry out your everyday physical activities such as walking, climbing stairs, carrying groceries, or moving a chair?  Rating(7)  Here to review MRI

## 2022-07-21 ENCOUNTER — Other Ambulatory Visit: Payer: Self-pay | Admitting: Family

## 2022-07-24 ENCOUNTER — Other Ambulatory Visit: Payer: Self-pay | Admitting: *Deleted

## 2022-07-24 ENCOUNTER — Other Ambulatory Visit: Payer: Self-pay | Admitting: Family

## 2022-07-24 MED ORDER — ACCU-CHEK GUIDE VI STRP: ORAL_STRIP | 3 refills | Status: DC

## 2022-08-02 ENCOUNTER — Telehealth: Payer: Self-pay | Admitting: Family Medicine

## 2022-08-03 ENCOUNTER — Ambulatory Visit: Payer: PPO | Admitting: Physical Medicine and Rehabilitation

## 2022-08-03 ENCOUNTER — Ambulatory Visit: Payer: Self-pay

## 2022-08-03 VITALS — BP 117/73 | HR 82

## 2022-08-03 DIAGNOSIS — M5416 Radiculopathy, lumbar region: Secondary | ICD-10-CM

## 2022-08-03 MED ORDER — METHYLPREDNISOLONE ACETATE 80 MG/ML IJ SUSP
80.0000 mg | Freq: Once | INTRAMUSCULAR | Status: AC
  Administered 2022-08-03: 80 mg

## 2022-08-03 NOTE — Telephone Encounter (Signed)
Outcome Denied on November 8 This request was denied under your Medicare Part D benefit; however, coverage/payment for the requested drug(s) has been APPROVED under Medicare Part B as long as your doctor continues to prescribe the drug for you, and it continues to be safe and

## 2022-08-03 NOTE — Patient Instructions (Signed)

## 2022-08-03 NOTE — Progress Notes (Signed)
Numeric Pain Rating Scale and Functional Assessment Average Pain 6   In the last MONTH (on 0-10 scale) has pain interfered with the following?  1. General activity like being  able to carry out your everyday physical activities such as walking, climbing stairs, carrying groceries, or moving a chair?  Rating(8)   +Driver, -BT, -Dye Allergies.  Activity causes more pain, but tries to play golf. Took muscle relaxer to ease pain. Pain in center of back and radiates to both sides and down right leg

## 2022-08-15 NOTE — Progress Notes (Signed)
Adam Barrera - 71 y.o. male MRN [id number]  Date of birth: 09/14/1951  Office Visit Note: Visit Date: 08/03/2022 PCP: Sharion Balloon, FNP Referred by: Sharion Balloon, FNP  Subjective: Chief Complaint  Patient presents with   Lower Back - Pain   HPI:  Adam Barrera is a 71 y.o. male who comes in today at the request of Barnet Pall, FNP for planned Bilateral L4-5 Lumbar Transforaminal epidural steroid injection with fluoroscopic guidance.  The patient has failed conservative care including home exercise, medications, time and activity modification.  This injection will be diagnostic and hopefully therapeutic.  Please see requesting physician notes for further details and justification.   ROS Otherwise per HPI.  Assessment & Plan: Visit Diagnoses:    ICD-10-CM   1. Lumbar radiculopathy  M54.16 XR C-ARM NO REPORT    Epidural Steroid injection    methylPREDNISolone acetate (DEPO-MEDROL) injection 80 mg      Plan: No additional findings.   Meds & Orders:  Meds ordered this encounter  Medications   methylPREDNISolone acetate (DEPO-MEDROL) injection 80 mg    Orders Placed This Encounter  Procedures   XR C-ARM NO REPORT   Epidural Steroid injection    Follow-up: Return for visit to requesting provider as needed.   Procedures: No procedures performed  Lumbosacral Transforaminal Epidural Steroid Injection - Sub-Pedicular Approach with Fluoroscopic Guidance  Patient: Adam Barrera      Date of Birth: [birth year] MRN: [id number] PCP: Sharion Balloon, FNP      Visit Date: 08/03/2022   Universal Protocol:    Date/Time: 08/03/2022  Consent Given By: the patient  Position: PRONE  Additional Comments: Vital signs were monitored before and after the procedure. Patient was prepped and draped in the usual sterile fashion. The correct patient, procedure, and site was verified.   Injection Procedure Details:   Procedure diagnoses: Lumbar radiculopathy [M54.16]     Meds Administered:  Meds ordered this encounter  Medications   methylPREDNISolone acetate (DEPO-MEDROL) injection 80 mg    Laterality: Bilateral  Location/Site: L4  Needle:5.0 in., 22 ga.  Short bevel or Quincke spinal needle  Needle Placement: Transforaminal  Findings:    -Comments: Excellent flow of contrast along the nerve, nerve root and into the epidural space.  Procedure Details: After squaring off the end-plates to get a true AP view, the C-arm was positioned so that an oblique view of the foramen as noted above was visualized. The target area is just inferior to the "nose of the scotty dog" or sub pedicular. The soft tissues overlying this structure were infiltrated with 2-3 ml. of 1% Lidocaine without Epinephrine.  The spinal needle was inserted toward the target using a "trajectory" view along the fluoroscope beam.  Under AP and lateral visualization, the needle was advanced so it did not puncture dura and was located close the 6 O'Clock position of the pedical in AP tracterory. Biplanar projections were used to confirm position. Aspiration was confirmed to be negative for CSF and/or blood. A 1-2 ml. volume of Isovue-250 was injected and flow of contrast was noted at each level. Radiographs were obtained for documentation purposes.   After attaining the desired flow of contrast documented above, a 0.5 to 1.0 ml test dose of 0.25% Marcaine was injected into each respective transforaminal space.  The patient was observed for 90 seconds post injection.  After no sensory deficits were reported, and normal lower extremity motor function was noted,   the above injectate  was administered so that equal amounts of the injectate were placed at each foramen (level) into the transforaminal epidural space.   Additional Comments:  The patient tolerated the procedure well Dressing: 2 x 2 sterile gauze and Band-Aid    Post-procedure details: Patient was observed during the  procedure. Post-procedure instructions were reviewed.  Patient left the clinic in stable condition.    Clinical History: EXAM: MRI LUMBAR SPINE WITHOUT CONTRAST   TECHNIQUE: Multiplanar, multisequence MR imaging of the lumbar spine was performed. No intravenous contrast was administered.   COMPARISON:  None Available.   FINDINGS: Segmentation:  Standard.   Alignment:  Trace anterolisthesis of L4 on L5.   Vertebrae: No fracture, evidence of discitis, or bone lesion. Scattered degenerative endplate marrow changes, including a Schmorl's node of the L4 level.   Conus medullaris and cauda equina: Conus extends to the L2 level. Conus and cauda equina appear normal. There is a fatty filum terminale.   Paraspinal and other soft tissues: Small bilateral T2 hyperintense renal lesions are favored to represent renal cysts. No further follow-up is recommended. There is mild atrophy of the paraspinal musculature.   Disc levels:   T11-T12: Moderate bilateral facet degenerative change. No spinal canal stenosis. No neural foraminal stenosis.   T12-L1: Moderate bilateral facet degenerative change. No spinal canal stenosis. No neural foraminal stenosis.   L1-L2: Moderate left and mild right facet degenerative change. No spinal canal stenosis. No neural foraminal stenosis.   L2-L3: Moderate bilateral facet degenerative change. No spinal canal stenosis. Mild bilateral neural foraminal stenosis.   L3-L4: Moderate bilateral facet degenerative change. Ligamentum flavum hypertrophy. Circumferential disc bulge. Mild spinal canal stenosis. Mild-to-moderate bilateral neural foraminal stenosis.   L4-L5: Severe bilateral facet degenerative change. Ligamentum flavum hypertrophy. Circumferential disc bulge. Severe spinal canal stenosis. Moderate to severe bilateral neural foraminal stenosis. There is redundancy of the cauda equina nerve roots above this level.   L5-S1: Moderate bilateral  facet degenerative change. Circumferential disc bulge. Mild spinal canal stenosis. Moderate bilateral neural foraminal stenosis.   IMPRESSION: 1. Multilevel degenerative changes of the lumbar spine, worst at L4-L5 where there is severe spinal canal stenosis and moderate to severe bilateral neural foraminal stenosis. There is redundancy of the cauda equina nerve roots above this level, suggestive of high-grade spinal canal stenosis. 2. Additional degenerative changes, as above.     Electronically Signed   By: Marin Roberts M.D.   On: 07/19/2022 10:23     Objective:  VS:  HT:    WT:   BMI:     BP:117/73  HR:82bpm  TEMP: ( )  RESP:  Physical Exam Vitals and nursing note reviewed.  Constitutional:      General: He is not in acute distress.    Appearance: Normal appearance. He is obese. He is not ill-appearing.  HENT:     Head: Normocephalic and atraumatic.     Right Ear: External ear normal.     Left Ear: External ear normal.     Nose: No congestion.  Eyes:     Extraocular Movements: Extraocular movements intact.  Cardiovascular:     Rate and Rhythm: Normal rate.     Pulses: Normal pulses.  Pulmonary:     Effort: Pulmonary effort is normal. No respiratory distress.  Abdominal:     General: There is no distension.     Palpations: Abdomen is soft.  Musculoskeletal:        General: No tenderness or signs of injury.  Cervical back: Neck supple.     Right lower leg: No edema.     Left lower leg: No edema.     Comments: Patient has good distal strength without clonus.  Skin:    Findings: No erythema or rash.  Neurological:     General: No focal deficit present.     Mental Status: He is alert and oriented to person, place, and time.     Sensory: No sensory deficit.     Motor: No weakness or abnormal muscle tone.     Coordination: Coordination normal.  Psychiatric:        Mood and Affect: Mood normal.        Behavior: Behavior normal.      Imaging: No results  found.

## 2022-08-15 NOTE — Procedures (Signed)
Lumbosacral Transforaminal Epidural Steroid Injection - Sub-Pedicular Approach with Fluoroscopic Guidance  Patient: Adam Barrera      Date of Birth: [birth year] MRN: [id number] PCP: Sharion Balloon, FNP      Visit Date: 08/03/2022   Universal Protocol:    Date/Time: 08/03/2022  Consent Given By: the patient  Position: PRONE  Additional Comments: Vital signs were monitored before and after the procedure. Patient was prepped and draped in the usual sterile fashion. The correct patient, procedure, and site was verified.   Injection Procedure Details:   Procedure diagnoses: Lumbar radiculopathy [M54.16]    Meds Administered:  Meds ordered this encounter  Medications   methylPREDNISolone acetate (DEPO-MEDROL) injection 80 mg    Laterality: Bilateral  Location/Site: L4  Needle:5.0 in., 22 ga.  Short bevel or Quincke spinal needle  Needle Placement: Transforaminal  Findings:    -Comments: Excellent flow of contrast along the nerve, nerve root and into the epidural space.  Procedure Details: After squaring off the end-plates to get a true AP view, the C-arm was positioned so that an oblique view of the foramen as noted above was visualized. The target area is just inferior to the "nose of the scotty dog" or sub pedicular. The soft tissues overlying this structure were infiltrated with 2-3 ml. of 1% Lidocaine without Epinephrine.  The spinal needle was inserted toward the target using a "trajectory" view along the fluoroscope beam.  Under AP and lateral visualization, the needle was advanced so it did not puncture dura and was located close the 6 O'Clock position of the pedical in AP tracterory. Biplanar projections were used to confirm position. Aspiration was confirmed to be negative for CSF and/or blood. A 1-2 ml. volume of Isovue-250 was injected and flow of contrast was noted at each level. Radiographs were obtained for documentation purposes.   After attaining the desired  flow of contrast documented above, a 0.5 to 1.0 ml test dose of 0.25% Marcaine was injected into each respective transforaminal space.  The patient was observed for 90 seconds post injection.  After no sensory deficits were reported, and normal lower extremity motor function was noted,   the above injectate was administered so that equal amounts of the injectate were placed at each foramen (level) into the transforaminal epidural space.   Additional Comments:  The patient tolerated the procedure well Dressing: 2 x 2 sterile gauze and Band-Aid    Post-procedure details: Patient was observed during the procedure. Post-procedure instructions were reviewed.  Patient left the clinic in stable condition.

## 2022-08-23 ENCOUNTER — Ambulatory Visit (INDEPENDENT_AMBULATORY_CARE_PROVIDER_SITE_OTHER): Payer: PPO

## 2022-08-23 VITALS — Ht 70.0 in | Wt 283.0 lb

## 2022-08-23 DIAGNOSIS — Z0000 Encounter for general adult medical examination without abnormal findings: Secondary | ICD-10-CM | POA: Diagnosis not present

## 2022-08-23 NOTE — Patient Instructions (Signed)
Mr. Nealy , Thank you for taking time to come for your Medicare Wellness Visit. I appreciate your ongoing commitment to your health goals. Please review the following plan we discussed and let me know if I can assist you in the future.   These are the goals we discussed:  Goals      DIET - EAT MORE FRUITS AND VEGETABLES     DIET - INCREASE WATER INTAKE     Exercise 150 minutes per week (moderate activity)     Plan meals        This is a list of the screening recommended for you and due dates:  Health Maintenance  Topic Date Due   Screening for Lung Cancer  08/21/2020   Hemoglobin A1C  01/10/2022   COVID-19 Vaccine (4 - 2023-24 season) 05/26/2022   Zoster (Shingles) Vaccine (2 of 2) 06/20/2022   Flu Shot  12/24/2022*   Pneumonia Vaccine (3 - PPSV23 or PCV20) 04/26/2023*   Yearly kidney function blood test for diabetes  04/26/2023   Yearly kidney health urinalysis for diabetes  04/26/2023   Complete foot exam   04/26/2023   Eye exam for diabetics  05/20/2023   Medicare Annual Wellness Visit  08/24/2023   Colon Cancer Screening  12/08/2029   Hepatitis C Screening: USPSTF Recommendation to screen - Ages 18-79 yo.  Completed   HPV Vaccine  Aged Out  *Topic was postponed. The date shown is not the original due date.    Advanced directives: Advance directive discussed with you today. I have provided a copy for you to complete at home and have notarized. Once this is complete please bring a copy in to our office so we can scan it into your chart.   Conditions/risks identified: Aim for 30 minutes of exercise or brisk walking, 6-8 glasses of water, and 5 servings of fruits and vegetables each day.   Next appointment: Follow up in one year for your annual wellness visit.     Preventive Care 24 Years and Older, Male  Preventive care refers to lifestyle choices and visits with your health care provider that can promote health and wellness. What does preventive care include? A yearly  physical exam. This is also called an annual well check. Dental exams once or twice a year. Routine eye exams. Ask your health care provider how often you should have your eyes checked. Personal lifestyle choices, including: Daily care of your teeth and gums. Regular physical activity. Eating a healthy diet. Avoiding tobacco and drug use. Limiting alcohol use. Practicing safe sex. Taking low doses of aspirin every day. Taking vitamin and mineral supplements as recommended by your health care provider. What happens during an annual well check? The services and screenings done by your health care provider during your annual well check will depend on your age, overall health, lifestyle risk factors, and family history of disease. Counseling  Your health care provider may ask you questions about your: Alcohol use. Tobacco use. Drug use. Emotional well-being. Home and relationship well-being. Sexual activity. Eating habits. History of falls. Memory and ability to understand (cognition). Work and work Statistician. Screening  You may have the following tests or measurements: Height, weight, and BMI. Blood pressure. Lipid and cholesterol levels. These may be checked every 5 years, or more frequently if you are over 62 years old. Skin check. Lung cancer screening. You may have this screening every year starting at age 86 if you have a 30-pack-year history of smoking and currently smoke  or have quit within the past 15 years. Fecal occult blood test (FOBT) of the stool. You may have this test every year starting at age 37. Flexible sigmoidoscopy or colonoscopy. You may have a sigmoidoscopy every 5 years or a colonoscopy every 10 years starting at age 93. Prostate cancer screening. Recommendations will vary depending on your family history and other risks. Hepatitis C blood test. Hepatitis B blood test. Sexually transmitted disease (STD) testing. Diabetes screening. This is done by  checking your blood sugar (glucose) after you have not eaten for a while (fasting). You may have this done every 1-3 years. Abdominal aortic aneurysm (AAA) screening. You may need this if you are a current or former smoker. Osteoporosis. You may be screened starting at age 3 if you are at high risk. Talk with your health care provider about your test results, treatment options, and if necessary, the need for more tests. Vaccines  Your health care provider may recommend certain vaccines, such as: Influenza vaccine. This is recommended every year. Tetanus, diphtheria, and acellular pertussis (Tdap, Td) vaccine. You may need a Td booster every 10 years. Zoster vaccine. You may need this after age 2. Pneumococcal 13-valent conjugate (PCV13) vaccine. One dose is recommended after age 32. Pneumococcal polysaccharide (PPSV23) vaccine. One dose is recommended after age 26. Talk to your health care provider about which screenings and vaccines you need and how often you need them. This information is not intended to replace advice given to you by your health care provider. Make sure you discuss any questions you have with your health care provider. Document Released: 10/08/2015 Document Revised: 05/31/2016 Document Reviewed: 07/13/2015 Elsevier Interactive Patient Education  2017 [place] Prevention in the Home Falls can cause injuries. They can happen to people of all ages. There are many things you can do to make your home safe and to help prevent falls. What can I do on the outside of my home? Regularly fix the edges of walkways and driveways and fix any cracks. Remove anything that might make you trip as you walk through a door, such as a raised step or threshold. Trim any bushes or trees on the path to your home. Use bright outdoor lighting. Clear any walking paths of anything that might make someone trip, such as rocks or tools. Regularly check to see if handrails are loose or  broken. Make sure that both sides of any steps have handrails. Any raised decks and porches should have guardrails on the edges. Have any leaves, snow, or ice cleared regularly. Use sand or salt on walking paths during winter. Clean up any spills in your garage right away. This includes oil or grease spills. What can I do in the bathroom? Use night lights. Install grab bars by the toilet and in the tub and shower. Do not use towel bars as grab bars. Use non-skid mats or decals in the tub or shower. If you need to sit down in the shower, use a plastic, non-slip stool. Keep the floor dry. Clean up any water that spills on the floor as soon as it happens. Remove soap buildup in the tub or shower regularly. Attach bath mats securely with double-sided non-slip rug tape. Do not have throw rugs and other things on the floor that can make you trip. What can I do in the bedroom? Use night lights. Make sure that you have a light by your bed that is easy to reach. Do not use any sheets or blankets  that are too big for your bed. They should not hang down onto the floor. Have a firm chair that has side arms. You can use this for support while you get dressed. Do not have throw rugs and other things on the floor that can make you trip. What can I do in the kitchen? Clean up any spills right away. Avoid walking on wet floors. Keep items that you use a lot in easy-to-reach places. If you need to reach something above you, use a strong step stool that has a grab bar. Keep electrical cords out of the way. Do not use floor polish or wax that makes floors slippery. If you must use wax, use non-skid floor wax. Do not have throw rugs and other things on the floor that can make you trip. What can I do with my stairs? Do not leave any items on the stairs. Make sure that there are handrails on both sides of the stairs and use them. Fix handrails that are broken or loose. Make sure that handrails are as long as  the stairways. Check any carpeting to make sure that it is firmly attached to the stairs. Fix any carpet that is loose or worn. Avoid having throw rugs at the top or bottom of the stairs. If you do have throw rugs, attach them to the floor with carpet tape. Make sure that you have a light switch at the top of the stairs and the bottom of the stairs. If you do not have them, ask someone to add them for you. What else can I do to help prevent falls? Wear shoes that: Do not have high heels. Have rubber bottoms. Are comfortable and fit you well. Are closed at the toe. Do not wear sandals. If you use a stepladder: Make sure that it is fully opened. Do not climb a closed stepladder. Make sure that both sides of the stepladder are locked into place. Ask someone to hold it for you, if possible. Clearly mark and make sure that you can see: Any grab bars or handrails. First and last steps. Where the edge of each step is. Use tools that help you move around (mobility aids) if they are needed. These include: Canes. Walkers. Scooters. Crutches. Turn on the lights when you go into a dark area. Replace any light bulbs as soon as they burn out. Set up your furniture so you have a clear path. Avoid moving your furniture around. If any of your floors are uneven, fix them. If there are any pets around you, be aware of where they are. Review your medicines with your doctor. Some medicines can make you feel dizzy. This can increase your chance of falling. Ask your doctor what other things that you can do to help prevent falls. This information is not intended to replace advice given to you by your health care provider. Make sure you discuss any questions you have with your health care provider. Document Released: 07/08/2009 Document Revised: 02/17/2016 Document Reviewed: 10/16/2014 Elsevier Interactive Patient Education  2017 [employer].
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
## 2022-08-25 ENCOUNTER — Encounter: Payer: PPO | Admitting: Family

## 2022-08-27 ENCOUNTER — Other Ambulatory Visit: Payer: Self-pay | Admitting: Family

## 2022-08-28 MED ORDER — ONETOUCH VERIO VI STRP: ORAL_STRIP | 3 refills | Status: AC

## 2022-08-28 NOTE — Addendum Note (Signed)
Addended by: Antonietta Barcelona D on: 08/28/2022 08:59 AM   Modules accepted: Orders

## 2022-09-20 ENCOUNTER — Other Ambulatory Visit: Payer: Self-pay | Admitting: Physical Medicine and Rehabilitation

## 2022-10-01 ENCOUNTER — Other Ambulatory Visit: Payer: Self-pay | Admitting: Family

## 2022-10-01 DIAGNOSIS — J301 Allergic rhinitis due to pollen: Secondary | ICD-10-CM

## 2022-10-13 ENCOUNTER — Telehealth: Payer: Self-pay | Admitting: Physical Medicine and Rehabilitation

## 2022-10-13 NOTE — Telephone Encounter (Signed)
Pt called requesting a call to get a back injection. Please call pt at [phone].

## 2022-10-16 ENCOUNTER — Other Ambulatory Visit: Payer: Self-pay | Admitting: Physical Medicine and Rehabilitation

## 2022-10-16 DIAGNOSIS — M48062 Spinal stenosis, lumbar region with neurogenic claudication: Secondary | ICD-10-CM

## 2022-10-16 DIAGNOSIS — M5416 Radiculopathy, lumbar region: Secondary | ICD-10-CM

## 2022-10-16 NOTE — Telephone Encounter (Signed)
Spoke with patient and he stated the injection did not really help. He stated he and Dr. Ernestina Patches spoke about seeing Dr. Laurance Flatten. He is ready to speak with him and would like a referral to see him. Please advise

## 2022-10-17 ENCOUNTER — Ambulatory Visit (INDEPENDENT_AMBULATORY_CARE_PROVIDER_SITE_OTHER): Payer: PPO

## 2022-10-17 ENCOUNTER — Encounter: Payer: Self-pay | Admitting: Orthopedic Surgery

## 2022-10-17 ENCOUNTER — Ambulatory Visit (INDEPENDENT_AMBULATORY_CARE_PROVIDER_SITE_OTHER): Payer: PPO | Admitting: Orthopedic Surgery

## 2022-10-17 VITALS — BP 126/82 | HR 80 | Ht 70.0 in | Wt 283.0 lb

## 2022-10-17 DIAGNOSIS — M4726 Other spondylosis with radiculopathy, lumbar region: Secondary | ICD-10-CM

## 2022-10-17 DIAGNOSIS — M5416 Radiculopathy, lumbar region: Secondary | ICD-10-CM

## 2022-10-17 DIAGNOSIS — M47816 Spondylosis without myelopathy or radiculopathy, lumbar region: Secondary | ICD-10-CM

## 2022-10-17 DIAGNOSIS — M48062 Spinal stenosis, lumbar region with neurogenic claudication: Secondary | ICD-10-CM

## 2022-10-17 NOTE — Progress Notes (Addendum)
Orthopedic Spine Surgery Office Note  Assessment: Patient is a 72 y.o. male with low back, right lateral thigh, right posterior leg pain.  Leg pain improved with L4 steroid injection.  Has lateral recess stenosis at L3/4 and central with lateral recess stenosis at L4/5.   Plan: -Explained that initially conservative treatment is tried as a significant number of patients may experience relief with these treatment modalities. Discussed that the conservative treatments include:  -activity modification  -physical therapy  -over the counter pain medications  -medrol dosepak  -lumbar steroid injections -Patient has tried steroid injection, RFA, activity modification, robaxin  -Recommended core strengthening (exercises provided to him), PT (referral made), tylenol '1000mg'$  TID, voltaren gel to the back, robaxin (as needed, already has prescription) -Can continue with RFA for his back pain and periodic steroid injections for his leg pain -Patient would need a new A1c and to get down to a weight of 250 pounds before elective surgery would be considered.  His current BMI is 40.2 -If his weight comes down and he is no longer responding to conservative treatments, would consider L3-5 laminectomy and fusion. Explained that this would be done more for the leg pain. Since he is not having any leg pain at this time, I would hold off on surgery  -Patient should return to office in 6 weeks, x-rays at next visit: none   Patient expressed understanding of the plan and all questions were answered to the patient's satisfaction.   ___________________________________________________________________________   History:  Patient is a 72 y.o. male who presents today for lumbar spine.  Patient has had several years of low back pain.  There is no trauma or injury that brought on the pain.  He has been seeing Dr. Louanne Skye and getting RFA.  That procedure has been helping him with his back pain in addition to periodic use of  Robaxin.  Within the last 6 months, he has developed right leg pain.  He feels it on the lateral aspect of the thigh and down the posterior aspect of the leg.  He has no left-sided symptoms.  He had a L4 injection with Dr. Ernestina Patches and got significant relief of his leg pain.  He is no longer having any leg pain but does report his chronic back pain.  His back pain improves if he sits and is worse if he stands for more than a couple minutes.  It also gets better if he lays down.   Weakness: denies Symptoms of imbalance: denies Paresthesias and numbness: denies Bowel or bladder incontinence: denies Saddle anesthesia: denies  Treatments tried: steroid injection, RFA, activity modification, robaxin  Review of systems: Denies fevers and chills, night sweats, unexplained weight loss, history of cancer, pain that wakes them at night  Past medical history: HLD HTN Diabetes (last A1c was 7.4 on 07/12/2021) Chronic pain Hepatic steatosis Osteoarthritis BPH  Allergies: NKDA  Past surgical history:  Left shoulder replacement Bilateral hip replacement Left knee replacement Cholecystectomy  Social history: Denies use of nicotine product (smoking, vaping, patches, smokeless) Alcohol use: Denies Denies recreational drug use   Physical Exam:  BMI of 40.2  General: no acute distress, appears stated age Neurologic: alert, answering questions appropriately, following commands Respiratory: unlabored breathing on room air, symmetric chest rise Psychiatric: appropriate affect, normal cadence to speech   MSK (spine):  -Strength exam      Left  Right EHL    5/5  5/5 TA    5/5  5/5 GSC    5/5  5/5 Knee extension  5/5  5/5 Hip flexion   5/5  5/5  -Sensory exam    Sensation intact to light touch in L3-S1 nerve distributions of bilateral lower extremities  -Achilles DTR: 1/4 on the left, 1/4 on the right -Patellar tendon DTR: 1/4 on the left, 1/4 on the right  -Straight leg raise:  negative -Contralateral straight leg raise: negative -Femoral nerve stretch test: negative bilaterally -Clonus: no beats bilaterally  -Left hip exam: No pain to range of motion, negative Stinchfield, negative Faber -Right hip exam: No pain to range of motion, negative Stinchfield, negative FABER  Imaging: XR of the lumbar spine from 10/17/2022 was independently reviewed and interpreted, showing spondylolisthesis at L4-5.  It shifts 1.5 mm between flexion-extension views.  Disc height loss at L4/5 and L5/S1.  Facet arthropathy from L3-4 to L5-S1.  No fracture or dislocation.  MRI of the lumbar spine from 07/14/2022 was independently reviewed and interpreted, showing spondylolisthesis to be demonstrated at L4/5.  Redundant nerve root sign cranial to L4/5.  Lateral recess stenosis at L3/4.  Central and lateral recess stenosis at L4/5.  Foraminal stenosis at L3/4, L4/5, and L5/S1.   Patient name: Adam Barrera Patient MRN: [id number] Date of visit: 10/17/22

## 2022-10-26 ENCOUNTER — Encounter: Payer: Self-pay | Admitting: Family

## 2022-10-26 ENCOUNTER — Ambulatory Visit (INDEPENDENT_AMBULATORY_CARE_PROVIDER_SITE_OTHER): Payer: HMO | Admitting: Family

## 2022-10-26 VITALS — BP 115/68 | HR 80 | Temp 97.4°F | Ht 70.0 in | Wt 276.2 lb

## 2022-10-26 DIAGNOSIS — Z87891 Personal history of nicotine dependence: Secondary | ICD-10-CM

## 2022-10-26 DIAGNOSIS — E1169 Type 2 diabetes mellitus with other specified complication: Secondary | ICD-10-CM | POA: Diagnosis not present

## 2022-10-26 DIAGNOSIS — M19019 Primary osteoarthritis, unspecified shoulder: Secondary | ICD-10-CM

## 2022-10-26 DIAGNOSIS — E785 Hyperlipidemia, unspecified: Secondary | ICD-10-CM | POA: Diagnosis not present

## 2022-10-26 DIAGNOSIS — J410 Simple chronic bronchitis: Secondary | ICD-10-CM | POA: Diagnosis not present

## 2022-10-26 DIAGNOSIS — M170 Bilateral primary osteoarthritis of knee: Secondary | ICD-10-CM

## 2022-10-26 DIAGNOSIS — M1711 Unilateral primary osteoarthritis, right knee: Secondary | ICD-10-CM

## 2022-10-26 DIAGNOSIS — Z122 Encounter for screening for malignant neoplasm of respiratory organs: Secondary | ICD-10-CM

## 2022-10-26 DIAGNOSIS — R350 Frequency of micturition: Secondary | ICD-10-CM | POA: Diagnosis not present

## 2022-10-26 DIAGNOSIS — I152 Hypertension secondary to endocrine disorders: Secondary | ICD-10-CM | POA: Diagnosis not present

## 2022-10-26 DIAGNOSIS — N401 Enlarged prostate with lower urinary tract symptoms: Secondary | ICD-10-CM | POA: Diagnosis not present

## 2022-10-26 DIAGNOSIS — N3281 Overactive bladder: Secondary | ICD-10-CM | POA: Diagnosis not present

## 2022-10-26 DIAGNOSIS — M19011 Primary osteoarthritis, right shoulder: Secondary | ICD-10-CM

## 2022-10-26 DIAGNOSIS — Z23 Encounter for immunization: Secondary | ICD-10-CM

## 2022-10-26 DIAGNOSIS — I700 Atherosclerosis of aorta: Secondary | ICD-10-CM

## 2022-10-26 DIAGNOSIS — M1712 Unilateral primary osteoarthritis, left knee: Secondary | ICD-10-CM | POA: Diagnosis not present

## 2022-10-26 DIAGNOSIS — E1159 Type 2 diabetes mellitus with other circulatory complications: Secondary | ICD-10-CM

## 2022-10-26 DIAGNOSIS — R918 Other nonspecific abnormal finding of lung field: Secondary | ICD-10-CM

## 2022-10-26 DIAGNOSIS — K760 Fatty (change of) liver, not elsewhere classified: Secondary | ICD-10-CM | POA: Diagnosis not present

## 2022-10-26 LAB — BAYER DCA HB A1C WAIVED: HB A1C (BAYER DCA - WAIVED): 6.9 % — ABNORMAL HIGH (ref 4.8–5.6)

## 2022-10-26 NOTE — Patient Instructions (Signed)
Health Maintenance After Age 70 After age 70, you are at a higher risk for certain long-term diseases and infections as well as injuries from falls. Falls are a major cause of broken bones and head injuries in people who are older than age 70. Getting regular preventive care can help to keep you healthy and well. Preventive care includes getting regular testing and making lifestyle changes as recommended by your health care provider. Talk with your health care provider about: Which screenings and tests you should have. A screening is a test that checks for a disease when you have no symptoms. A diet and exercise plan that is right for you. What should I know about screenings and tests to prevent falls? Screening and testing are the best ways to find a health problem early. Early diagnosis and treatment give you the best chance of managing medical conditions that are common after age 70. Certain conditions and lifestyle choices may make you more likely to have a fall. Your health care provider may recommend: Regular vision checks. Poor vision and conditions such as cataracts can make you more likely to have a fall. If you wear glasses, make sure to get your prescription updated if your vision changes. Medicine review. Work with your health care provider to regularly review all of the medicines you are taking, including over-the-counter medicines. Ask your health care provider about any side effects that may make you more likely to have a fall. Tell your health care provider if any medicines that you take make you feel dizzy or sleepy. Strength and balance checks. Your health care provider may recommend certain tests to check your strength and balance while standing, walking, or changing positions. Foot health exam. Foot pain and numbness, as well as not wearing proper footwear, can make you more likely to have a fall. Screenings, including: Osteoporosis screening. Osteoporosis is a condition that causes  the bones to get weaker and break more easily. Blood pressure screening. Blood pressure changes and medicines to control blood pressure can make you feel dizzy. Depression screening. You may be more likely to have a fall if you have a fear of falling, feel depressed, or feel unable to do activities that you used to do. Alcohol use screening. Using too much alcohol can affect your balance and may make you more likely to have a fall. Follow these instructions at home: Lifestyle Do not drink alcohol if: Your health care provider tells you not to drink. If you drink alcohol: Limit how much you have to: 0-1 drink a day for women. 0-2 drinks a day for men. Know how much alcohol is in your drink. In the [place] one drink equals one 12 oz bottle of beer (355 mL), one 5 oz glass of wine (148 mL), or one 1 oz glass of hard liquor (44 mL). Do not use any products that contain nicotine or tobacco. These products include cigarettes, chewing tobacco, and vaping devices, such as e-cigarettes. If you need help quitting, ask your health care provider. Activity  Follow a regular exercise program to stay fit. This will help you maintain your balance. Ask your health care provider what types of exercise are appropriate for you. If you need a cane or walker, use it as recommended by your health care provider. Wear supportive shoes that have nonskid soles. Safety  Remove any tripping hazards, such as rugs, cords, and clutter. Install safety equipment such as grab bars in bathrooms and safety rails on stairs. Keep rooms and walkways   well-lit. General instructions Talk with your health care provider about your risks for falling. Tell your health care provider if: You fall. Be sure to tell your health care provider about all falls, even ones that seem minor. You feel dizzy, tiredness (fatigue), or off-balance. Take over-the-counter and prescription medicines only as told by your health care provider. These include  supplements. Eat a healthy diet and maintain a healthy weight. A healthy diet includes low-fat dairy products, low-fat (lean) meats, and fiber from whole grains, beans, and lots of fruits and vegetables. Stay current with your vaccines. Schedule regular health, dental, and eye exams. Summary Having a healthy lifestyle and getting preventive care can help to protect your health and wellness after age 70. Screening and testing are the best way to find a health problem early and help you avoid having a fall. Early diagnosis and treatment give you the best chance for managing medical conditions that are more common for people who are older than age 70. Falls are a major cause of broken bones and head injuries in people who are older than age 70. Take precautions to prevent a fall at home. Work with your health care provider to learn what changes you can make to improve your health and wellness and to prevent falls. This information is not intended to replace advice given to you by your health care provider. Make sure you discuss any questions you have with your health care provider. Document Revised: 01/31/2021 Document Reviewed: 01/31/2021 Elsevier Patient Education  2023 [employer]  

## 2022-10-26 NOTE — Addendum Note (Signed)
Addended by: Nigel Berthold C on: 10/26/2022 10:08 AM   Modules accepted: Orders

## 2022-10-26 NOTE — Progress Notes (Signed)
Subjective:    Patient ID: Adam Barrera, male    DOB: 1951/06/23, 72 y.o.   MRN: [id number]  Chief Complaint  Patient presents with   Medical Management of Chronic Issues   Pt presents to the office today for chronic follow up.  He was followed by Pulmonologist for asbestosis and pulmonary nodules,  but has not seen them in several years.   He is followed by Ortho for steroid injections in lumbar. Reports his pain is 3 out 10.   He is morbid obese with a BMI of 39. Has aortic atherosclerosis and takes Crestor daily.  He has COPD and quit smoking approx 2019. Uses albuterol inhaler as needed.  Diabetes He presents for his follow-up diabetic visit. Associated symptoms include blurred vision. Pertinent negatives for diabetes include no foot paresthesias. Symptoms are stable. Risk factors for coronary artery disease include diabetes mellitus, dyslipidemia, hypertension and sedentary lifestyle. He is following a generally unhealthy diet. His overall blood glucose range is 130-140 mg/dl. An ACE inhibitor/angiotensin II receptor blocker is being taken. Eye exam is current.  Hyperlipidemia This is a chronic problem. The current episode started more than 1 year ago. The problem is controlled. Recent lipid tests were reviewed and are normal. Exacerbating diseases include obesity. Pertinent negatives include no shortness of breath. Current antihyperlipidemic treatment includes statins. The current treatment provides moderate improvement of lipids. Risk factors for coronary artery disease include dyslipidemia, diabetes mellitus, hypertension, male sex and a sedentary lifestyle.  Hypertension This is a chronic problem. The current episode started more than 1 year ago. The problem has been resolved since onset. The problem is controlled. Associated symptoms include blurred vision and malaise/fatigue. Pertinent negatives include no peripheral edema or shortness of breath. Risk factors for coronary artery  disease include dyslipidemia, diabetes mellitus, male gender and sedentary lifestyle. The current treatment provides moderate improvement.  Arthritis Presents for follow-up visit. He complains of pain and stiffness. Affected locations include the left knee, right shoulder, left shoulder, left MCP and right MCP. His pain is at a severity of 5/10.  Urinary Frequency  This is a chronic problem. The current episode started more than 1 year ago. The pain is at a severity of 0/10. The patient is experiencing no pain. Associated symptoms include frequency and urgency. Pertinent negatives include no hematuria, nausea or vomiting. Treatments tried: oxybutynin.  Benign Prostatic Hypertrophy This is a chronic problem. The current episode started more than 1 year ago. Irritative symptoms include frequency, nocturia (3) and urgency. Pertinent negatives include no hematuria, nausea or vomiting. Past treatments include tamsulosin. The treatment provided moderate relief.      Review of Systems  Constitutional:  Positive for malaise/fatigue.  Eyes:  Positive for blurred vision.  Respiratory:  Negative for shortness of breath.   Gastrointestinal:  Negative for nausea and vomiting.  Genitourinary:  Positive for frequency, nocturia (3) and urgency. Negative for hematuria.  Musculoskeletal:  Positive for arthritis and stiffness.  All other systems reviewed and are negative.      Objective:   Physical Exam Vitals reviewed.  Constitutional:      General: He is not in acute distress.    Appearance: He is well-developed. He is obese.  HENT:     Head: Normocephalic.     Right Ear: Tympanic membrane normal.     Left Ear: Tympanic membrane normal.  Eyes:     General:        Right eye: No discharge.  Left eye: No discharge.     Pupils: Pupils are equal, round, and reactive to light.  Neck:     Thyroid: No thyromegaly.  Cardiovascular:     Rate and Rhythm: Normal rate and regular rhythm.     Heart  sounds: Normal heart sounds. No murmur heard. Pulmonary:     Effort: Pulmonary effort is normal. No respiratory distress.     Breath sounds: Normal breath sounds. No wheezing.  Abdominal:     General: Bowel sounds are normal. There is no distension.     Palpations: Abdomen is soft.     Tenderness: There is no abdominal tenderness.  Musculoskeletal:        General: No tenderness. Normal range of motion.     Cervical back: Normal range of motion and neck supple.     Left lower leg: Edema (2+) present.  Skin:    General: Skin is warm and dry.     Findings: No erythema or rash.  Neurological:     Mental Status: He is alert and oriented to person, place, and time.     Cranial Nerves: No cranial nerve deficit.     Deep Tendon Reflexes: Reflexes are normal and symmetric.  Psychiatric:        Behavior: Behavior normal.        Thought Content: Thought content normal.        Judgment: Judgment normal.       BP 115/68   Pulse 80   Temp (!) 97.4 F (36.3 C) (Temporal)   Ht '5\' 10"'$  (1.778 m)   Wt 276 lb 3.2 oz (125.3 kg)   SpO2 96%   BMI 39.63 kg/m      Assessment & Plan:  Adam Barrera comes in today with chief complaint of Medical Management of Chronic Issues   Diagnosis and orders addressed:  1. Aortic atherosclerosis (HCC) - CMP14+EGFR  2. Benign prostatic hyperplasia with urinary frequency - CMP14+EGFR  3. Hepatic steatosis - CMP14+EGFR  4. Hyperlipidemia associated with type 2 diabetes mellitus (HCC) - CMP14+EGFR  5. Hypertension associated with diabetes ([place]) - CMP14+EGFR  6. Morbid obesity ([place]) - CMP14+EGFR  7. Multiple pulmonary nodules - CMP14+EGFR  8. OAB (overactive bladder) - CMP14+EGFR  9. Primary osteoarthritis of left knee - CMP14+EGFR  10. Unilateral primary osteoarthritis, right knee - CMP14+EGFR  11. Shoulder arthritis - CMP14+EGFR  12. Screening for lung cancer - CT CHEST LUNG CA SCREEN LOW DOSE W/O CM; Future - CMP14+EGFR  13.  Type 2 diabetes mellitus with other specified complication, without long-term current use of insulin (HCC) - Bayer DCA Hb A1c Waived - CMP14+EGFR  14. Former smoker - CMP14+EGFR  15. Simple chronic bronchitis ([place])   Labs pending Continue medications  Health Maintenance reviewed- CT low dose scan pending  Diet and exercise encouraged  Follow up plan: 6 months    Evelina Dun, FNP

## 2022-10-27 LAB — CMP14+EGFR
ALT: 8 IU/L (ref 0–44)
AST: 11 IU/L (ref 0–40)
Albumin/Globulin Ratio: 1.5 (ref 1.2–2.2)
Albumin: 4.1 g/dL (ref 3.9–4.9)
Alkaline Phosphatase: 60 IU/L (ref 44–121)
BUN/Creatinine Ratio: 9 — ABNORMAL LOW (ref 10–24)
BUN: 11 mg/dL (ref 8–27)
Bilirubin Total: 0.7 mg/dL (ref 0.0–1.2)
CO2: 20 mmol/L (ref 20–29)
Calcium: 9.4 mg/dL (ref 8.6–10.2)
Chloride: 104 mmol/L (ref 96–106)
Creatinine, Ser: 1.18 mg/dL (ref 0.76–1.27)
Globulin, Total: 2.7 g/dL (ref 1.5–4.5)
Glucose: 137 mg/dL — ABNORMAL HIGH (ref 70–99)
Potassium: 4.3 mmol/L (ref 3.5–5.2)
Sodium: 146 mmol/L — ABNORMAL HIGH (ref 134–144)
Total Protein: 6.8 g/dL (ref 6.0–8.5)
eGFR: 67 mL/min/{1.73_m2} (ref >59)

## 2022-10-31 ENCOUNTER — Ambulatory Visit: Payer: PPO | Admitting: Physical Therapy

## 2022-11-09 ENCOUNTER — Ambulatory Visit (HOSPITAL_BASED_OUTPATIENT_CLINIC_OR_DEPARTMENT_OTHER)
Admission: RE | Admit: 2022-11-09 | Discharge: 2022-11-09 | Disposition: A | Discharge status: Home / Self Care | Payer: HMO | Source: Ambulatory Visit | Attending: Family | Admitting: Family

## 2022-11-09 DIAGNOSIS — Z122 Encounter for screening for malignant neoplasm of respiratory organs: Secondary | ICD-10-CM | POA: Diagnosis not present

## 2022-11-09 DIAGNOSIS — I700 Atherosclerosis of aorta: Secondary | ICD-10-CM | POA: Diagnosis not present

## 2022-11-09 DIAGNOSIS — J439 Emphysema, unspecified: Secondary | ICD-10-CM | POA: Diagnosis not present

## 2022-11-09 DIAGNOSIS — Z87891 Personal history of nicotine dependence: Secondary | ICD-10-CM | POA: Insufficient documentation

## 2022-11-12 ENCOUNTER — Other Ambulatory Visit: Payer: Self-pay | Admitting: Physical Medicine and Rehabilitation

## 2022-11-12 ENCOUNTER — Other Ambulatory Visit: Payer: Self-pay | Admitting: Family

## 2022-11-12 DIAGNOSIS — I10 Essential (primary) hypertension: Secondary | ICD-10-CM

## 2022-11-30 ENCOUNTER — Ambulatory Visit (INDEPENDENT_AMBULATORY_CARE_PROVIDER_SITE_OTHER): Payer: HMO | Admitting: Orthopedic Surgery

## 2022-11-30 DIAGNOSIS — M5416 Radiculopathy, lumbar region: Secondary | ICD-10-CM | POA: Diagnosis not present

## 2022-11-30 NOTE — Progress Notes (Signed)
Orthopedic Spine Surgery Office Note  Assessment: Patient is a 72 y.o. male with low back, right lateral thigh, right posterior leg pain.  Leg pain has resolved after injection.  Has lateral recess stenosis at L3/4 and central with lateral recess stenosis at L4/5.    Plan: -Explained that initially conservative treatment is tried as a significant number of patients may experience relief with these treatment modalities. Discussed that the conservative treatments include:  -activity modification  -physical therapy  -over the counter pain medications  -medrol dosepak  -lumbar steroid injections -Patient has tried steroid injection, RFA, activity modification, robaxin, home exercise program -Recommended he continue with tylenol and home exercise program -Patient's A1C was (6.9 on 10/26/2022) so would not need to lower prior to any surgical intervention. He would need to get down to a weight of 250 pounds before elective surgery would be considered. Encouraged continued efforts with weight loss which may help with his back pain.  -Patient should return to office on as needed basis   Patient expressed understanding of the plan and all questions were answered to the patient's satisfaction.   ___________________________________________________________________________  History: Patient is a 72 y.o. male who has been previously seen in the office for symptoms of low back pain that radiates into right lateral thigh and posterior leg. His leg pain resolved after an injection. He is still having low back pain but it is tolerable. He says tylenol provides him with adequate relief. He is doing home exercises which are helping. Denies paresthesias and numbness.   Previous treatments: steroid injection, RFA, activity modification, robaxin, home exercise program  Physical Exam:  General: no acute distress, appears stated age Neurologic: alert, answering questions appropriately, following  commands Respiratory: unlabored breathing on room air, symmetric chest rise Psychiatric: appropriate affect, normal cadence to speech   MSK (spine):  -Strength exam      Left  Right EHL    5/5  5/5 TA    5/5  5/5 GSC    5/5  5/5 Knee extension  5/5  5/5 Hip flexion   5/5  5/5  -Sensory exam    Sensation intact to light touch in L3-S1 nerve distributions of bilateral lower extremities  -Achilles DTR: 1/4 on the left, 1/4 on the right -Patellar tendon DTR: 1/4 on the left, 1/4 on the right  -Straight leg raise: negative -Contralateral straight leg raise: negative -Femoral nerve stretch test: negative bilaterally -Clonus: no beats bilaterally  Imaging: XR of the lumbar spine from 10/17/2022 was previously independently reviewed and interpreted, showing spondylolisthesis at L4-5.  It shifts 1.5 mm between flexion-extension views.  Disc height loss at L4/5 and L5/S1.  Facet arthropathy from L3-4 to L5-S1.  No fracture or dislocation.   MRI of the lumbar spine from 07/14/2022 was previously independently reviewed and interpreted, showing spondylolisthesis to be demonstrated at L4/5.  Redundant nerve root sign cranial to L4/5.  Lateral recess stenosis at L3/4.  Central and lateral recess stenosis at L4/5.  Foraminal stenosis at L3/4, L4/5, and L5/S1.   Patient name: Adam Barrera Patient MRN: [id number] Date of visit: 11/30/22

## 2022-12-05 ENCOUNTER — Other Ambulatory Visit: Payer: Self-pay | Admitting: Family

## 2022-12-05 DIAGNOSIS — J301 Allergic rhinitis due to pollen: Secondary | ICD-10-CM

## 2023-03-25 ENCOUNTER — Other Ambulatory Visit: Payer: Self-pay | Admitting: Family

## 2023-03-25 DIAGNOSIS — I10 Essential (primary) hypertension: Secondary | ICD-10-CM

## 2023-04-01 ENCOUNTER — Other Ambulatory Visit: Payer: Self-pay | Admitting: Family

## 2023-04-01 DIAGNOSIS — N401 Enlarged prostate with lower urinary tract symptoms: Secondary | ICD-10-CM

## 2023-04-13 ENCOUNTER — Encounter: Payer: Self-pay | Admitting: Family Medicine

## 2023-04-13 ENCOUNTER — Telehealth (INDEPENDENT_AMBULATORY_CARE_PROVIDER_SITE_OTHER): Payer: HMO | Admitting: Family Medicine

## 2023-04-13 DIAGNOSIS — U071 COVID-19: Secondary | ICD-10-CM | POA: Diagnosis not present

## 2023-04-13 MED ORDER — NIRMATRELVIR/RITONAVIR (PAXLOVID)TABLET: 3.0000 | ORAL_TABLET | Freq: Two times a day (BID) | ORAL | 0 refills | Status: AC

## 2023-04-13 NOTE — Progress Notes (Signed)
   Virtual Visit via video Note   Due to COVID-19 pandemic this visit was conducted virtually. This visit type was conducted due to national recommendations for restrictions regarding the COVID-19 Pandemic (e.g. social distancing, sheltering in place) in an effort to limit this patient's exposure and mitigate transmission in our community. All issues noted in this document were discussed and addressed.  A physical exam was not performed with this format.  I connected with  Adam Barrera  on 04/13/23 at 1338 by video and verified that I am speaking with the correct person using two identifiers. Adam Barrera is currently located at home and no one is currently with him during the visit. The provider, Gabriel Earing, FNP is located in their office at time of visit.  I discussed the limitations, risks, security and privacy concerns of performing an evaluation and management service by video  and the availability of in person appointments. I also discussed with the patient that there may be a patient responsible charge related to this service. The patient expressed understanding and agreed to proceed.  CC: Covid  History and Present Illness:  Adam Barrera reports positive home Covid test this morning. Symptoms started last night and include HA, chills, myalgias, nasal congestion, nonproductive cough. Denies fever, chest pain, shortness of breath, sore throat, wheezing, nausea, vomiting, or diarrhea. He has taken tylenol for his symptoms.    ROS As per HPI.    Observations/Objective: Alert and oriented. Respirations unlabored. No cyanosis. Non toxic appearing. Normal mood and behavior.    Assessment and Plan: Alic was seen today for covid positive.  Diagnoses and all orders for this visit:  COVID Hx HTN and DM. Hold crestor while taking paxlovid. Discussed symptomatic care, quarantine and and return precautions.  -     nirmatrelvir/ritonavir (PAXLOVID) 20 x 150 MG & 10 x 100MG  TABS; Take 3  tablets by mouth 2 (two) times daily for 5 days. (Take nirmatrelvir 150 mg two tablets twice daily for 5 days and ritonavir 100 mg one tablet twice daily for 5 days) Patient GFR is 67.     Follow Up Instructions: Return to office for new or worsening symptoms, or if symptoms persist.     I discussed the assessment and treatment plan with the patient. The patient was provided an opportunity to ask questions and all were answered. The patient agreed with the plan and demonstrated an understanding of the instructions.   The patient was advised to call back or seek an in-person evaluation if the symptoms worsen or if the condition fails to improve as anticipated.  The above assessment and management plan was discussed with the patient. The patient verbalized understanding of and has agreed to the management plan. Patient is aware to call the clinic if symptoms persist or worsen. Patient is aware when to return to the clinic for a follow-up visit. Patient educated on when it is appropriate to go to the emergency department.   Time call ended:[id number]  I provided 6 minutes of face-to-face time during this encounter.    Gabriel Earing, FNP

## 2023-04-26 ENCOUNTER — Ambulatory Visit (INDEPENDENT_AMBULATORY_CARE_PROVIDER_SITE_OTHER): Payer: HMO | Admitting: Family

## 2023-04-26 ENCOUNTER — Encounter: Payer: Self-pay | Admitting: Family

## 2023-04-26 VITALS — BP 124/71 | HR 79 | Temp 98.7°F | Ht 70.0 in | Wt 272.0 lb

## 2023-04-26 DIAGNOSIS — I739 Peripheral vascular disease, unspecified: Secondary | ICD-10-CM | POA: Diagnosis not present

## 2023-04-26 DIAGNOSIS — R062 Wheezing: Secondary | ICD-10-CM | POA: Diagnosis not present

## 2023-04-26 DIAGNOSIS — N3281 Overactive bladder: Secondary | ICD-10-CM

## 2023-04-26 DIAGNOSIS — J410 Simple chronic bronchitis: Secondary | ICD-10-CM

## 2023-04-26 DIAGNOSIS — I10 Essential (primary) hypertension: Secondary | ICD-10-CM

## 2023-04-26 DIAGNOSIS — E785 Hyperlipidemia, unspecified: Secondary | ICD-10-CM | POA: Diagnosis not present

## 2023-04-26 DIAGNOSIS — N401 Enlarged prostate with lower urinary tract symptoms: Secondary | ICD-10-CM

## 2023-04-26 DIAGNOSIS — M1712 Unilateral primary osteoarthritis, left knee: Secondary | ICD-10-CM

## 2023-04-26 DIAGNOSIS — Z0000 Encounter for general adult medical examination without abnormal findings: Secondary | ICD-10-CM

## 2023-04-26 DIAGNOSIS — E1169 Type 2 diabetes mellitus with other specified complication: Secondary | ICD-10-CM | POA: Diagnosis not present

## 2023-04-26 DIAGNOSIS — Z0001 Encounter for general adult medical examination with abnormal findings: Secondary | ICD-10-CM

## 2023-04-26 DIAGNOSIS — E1159 Type 2 diabetes mellitus with other circulatory complications: Secondary | ICD-10-CM

## 2023-04-26 DIAGNOSIS — J449 Chronic obstructive pulmonary disease, unspecified: Secondary | ICD-10-CM

## 2023-04-26 DIAGNOSIS — E1151 Type 2 diabetes mellitus with diabetic peripheral angiopathy without gangrene: Secondary | ICD-10-CM

## 2023-04-26 DIAGNOSIS — I152 Hypertension secondary to endocrine disorders: Secondary | ICD-10-CM

## 2023-04-26 DIAGNOSIS — I700 Atherosclerosis of aorta: Secondary | ICD-10-CM

## 2023-04-26 DIAGNOSIS — R350 Frequency of micturition: Secondary | ICD-10-CM | POA: Diagnosis not present

## 2023-04-26 LAB — CBC WITH DIFFERENTIAL/PLATELET
Basophils Absolute: 0 10*3/uL (ref 0.0–0.2)
Basos: 0 %
EOS (ABSOLUTE): 0.3 10*3/uL (ref 0.0–0.4)
Eos: 5 %
Hematocrit: 37.3 % — ABNORMAL LOW (ref 37.5–51.0)
Hemoglobin: 12.1 g/dL — ABNORMAL LOW (ref 13.0–17.7)
Immature Granulocytes: 0 %
Lymphocytes Absolute: 1.5 10*3/uL (ref 0.7–3.1)
Lymphs: 29 %
MCH: 30.2 pg (ref 26.6–33.0)
MCHC: 32.4 g/dL (ref 31.5–35.7)
MCV: 93 fL (ref 79–97)
Monocytes Absolute: 0.3 10*3/uL (ref 0.1–0.9)
Monocytes: 6 %
Neutrophils Absolute: 3.1 10*3/uL (ref 1.4–7.0)
Neutrophils: 60 %
Platelets: 196 10*3/uL (ref 150–450)
RBC: 4.01 x10E6/uL — ABNORMAL LOW (ref 4.14–5.80)
RDW: 12.7 % (ref 11.6–15.4)
WBC: 5.2 10*3/uL (ref 3.4–10.8)

## 2023-04-26 LAB — TSH

## 2023-04-26 LAB — CMP14+EGFR
ALT: 14 IU/L (ref 0–44)
AST: 12 IU/L (ref 0–40)
Albumin: 4 g/dL (ref 3.9–4.9)
Alkaline Phosphatase: 54 IU/L (ref 44–121)
BUN/Creatinine Ratio: 9 — ABNORMAL LOW (ref 10–24)
BUN: 11 mg/dL (ref 8–27)
Bilirubin Total: 0.7 mg/dL (ref 0.0–1.2)
CO2: 23 mmol/L (ref 20–29)
Calcium: 9.8 mg/dL (ref 8.6–10.2)
Chloride: 105 mmol/L (ref 96–106)
Creatinine, Ser: 1.25 mg/dL (ref 0.76–1.27)
Globulin, Total: 2.4 g/dL (ref 1.5–4.5)
Glucose: 136 mg/dL — ABNORMAL HIGH (ref 70–99)
Potassium: 5 mmol/L (ref 3.5–5.2)
Sodium: 145 mmol/L — ABNORMAL HIGH (ref 134–144)
Total Protein: 6.4 g/dL (ref 6.0–8.5)
eGFR: 62 mL/min/{1.73_m2} (ref >59)

## 2023-04-26 LAB — PSA, TOTAL AND FREE

## 2023-04-26 LAB — LIPID PANEL
Chol/HDL Ratio: 2.5 ratio (ref 0.0–5.0)
Cholesterol, Total: 82 mg/dL — ABNORMAL LOW (ref 100–199)
HDL: 33 mg/dL — ABNORMAL LOW (ref >39)
LDL Chol Calc (NIH): 33 mg/dL (ref 0–99)
Triglycerides: 77 mg/dL (ref 0–149)
VLDL Cholesterol Cal: 16 mg/dL (ref 5–40)

## 2023-04-26 LAB — BAYER DCA HB A1C WAIVED: HB A1C (BAYER DCA - WAIVED): 6.6 % — ABNORMAL HIGH (ref 4.8–5.6)

## 2023-04-26 MED ORDER — ALBUTEROL SULFATE HFA 108 (90 BASE) MCG/ACT IN AERS: INHALATION_SPRAY | RESPIRATORY_TRACT | 2 refills | Status: DC

## 2023-04-26 MED ORDER — LOSARTAN POTASSIUM 100 MG PO TABS: 100.0000 mg | ORAL_TABLET | Freq: Every day | ORAL | 0 refills | Status: DC

## 2023-04-26 MED ORDER — ROSUVASTATIN CALCIUM 20 MG PO TABS: 20.0000 mg | ORAL_TABLET | Freq: Every day | ORAL | 3 refills | Status: DC

## 2023-04-26 MED ORDER — METFORMIN HCL 1000 MG PO TABS: 1000.0000 mg | ORAL_TABLET | Freq: Two times a day (BID) | ORAL | 3 refills | Status: DC

## 2023-04-26 MED ORDER — OXYBUTYNIN CHLORIDE ER 15 MG PO TB24: 15.0000 mg | ORAL_TABLET | Freq: Every day | ORAL | 1 refills | Status: DC

## 2023-04-26 MED ORDER — TAMSULOSIN HCL 0.4 MG PO CAPS: 0.4000 mg | ORAL_CAPSULE | Freq: Every day | ORAL | 0 refills | Status: DC

## 2023-04-26 NOTE — Progress Notes (Signed)
Subjective:    Patient ID: Adam Barrera, male    DOB: [birth year], 72 y.o.   MRN: [id number]  Chief Complaint  Patient presents with   Medical Management of Chronic Issues   Covid Positive    On 07/19 still stuffy    Pt presents to the office today for CPE and chronic follow up.  He was followed by Pulmonologist for asbestosis and pulmonary nodules,  but has not seen them in several years.    Has CKD and tries to avoid NSAID's.    He is morbid obese with a BMI of 39 and DM and HTN.   Has aortic atherosclerosis and takes Crestor daily.   Diabetes He presents for his follow-up diabetic visit. He has type 2 diabetes mellitus. Pertinent negatives for diabetes include no blurred vision and no foot paresthesias. Symptoms are stable. Risk factors for coronary artery disease include dyslipidemia, diabetes mellitus, hypertension and sedentary lifestyle. He is following a generally healthy diet. His overall blood glucose range is 130-140 mg/dl.  Hypertension This is a chronic problem. The current episode started more than 1 year ago. The problem has been resolved since onset. The problem is controlled. Associated symptoms include malaise/fatigue and peripheral edema. Pertinent negatives include no blurred vision or shortness of breath. Risk factors for coronary artery disease include dyslipidemia, male gender and sedentary lifestyle. The current treatment provides moderate improvement.  Hyperlipidemia This is a chronic problem. The current episode started more than 1 year ago. The problem is controlled. Recent lipid tests were reviewed and are normal. Exacerbating diseases include obesity. Pertinent negatives include no shortness of breath. Current antihyperlipidemic treatment includes statins. The current treatment provides moderate improvement of lipids. Risk factors for coronary artery disease include dyslipidemia, diabetes mellitus, hypertension, male sex and a sedentary lifestyle.   Arthritis Presents for follow-up visit. He complains of pain and stiffness. Affected locations include the left knee and right knee (back). His pain is at a severity of 5/10.  Urinary Frequency  This is a chronic problem. The current episode started more than 1 year ago. Associated symptoms include frequency and urgency.  Benign Prostatic Hypertrophy This is a chronic problem. The current episode started more than 1 year ago. Irritative symptoms include frequency, nocturia (3) and urgency. Past treatments include tamsulosin. The treatment provided moderate relief.      Review of Systems  Constitutional:  Positive for malaise/fatigue.  Eyes:  Negative for blurred vision.  Respiratory:  Negative for shortness of breath.   Genitourinary:  Positive for frequency, nocturia (3) and urgency.  Musculoskeletal:  Positive for arthritis and stiffness.  All other systems reviewed and are negative.  Family History  Problem Relation Age of Onset   Prostate cancer Other        family history    Prostate cancer Father    Colon cancer Father    Cancer Father    Hypertension Father    Diabetes Mother    Stroke Mother    Alzheimer's disease Mother    Hypertension Mother    Stomach cancer Brother 46   Cancer Brother    Colon cancer Brother 25    Social History   Socioeconomic History   Marital status: Married    Spouse name: Maureen Ralphs   Number of children: 2   Years of education: 14   Highest education level: Associate degree: occupational, Scientist, product/process development, or vocational program  Occupational History   Occupation: Retired from [employer]  Tobacco Use   Smoking status:  Former    Current packs/day: 0.00    Average packs/day: 1 pack/day for 30.0 years (30.0 ttl pk-yrs)    Types: Cigarettes    Start date: 07/28/1985    Quit date: 07/29/2015    Years since quitting: 7.7   Smokeless tobacco: Never  Vaping Use   Vaping status: Never Used  Substance and Sexual Activity   Alcohol use: No   Drug  use: No   Sexual activity: Yes    Birth control/protection: None  Other Topics Concern   Not on file  Social History Narrative   Lives in 2 story home with wife - daughter lives with them   Involved in church - enjoys playing golf 2-3 times per week   Social Determinants of Health   Financial Resource Strain: Low Risk  (08/23/2022)   Overall Financial Resource Strain (CARDIA)    Difficulty of Paying Living Expenses: Not hard at all  Food Insecurity: No Food Insecurity (08/23/2022)   Hunger Vital Sign    Worried About Running Out of Food in the Last Year: Never true    Ran Out of Food in the Last Year: Never true  Transportation Needs: No Transportation Needs (08/23/2022)   PRAPARE - Administrator, Civil Service (Medical): No    Lack of Transportation (Non-Medical): No  Physical Activity: Sufficiently Active (08/23/2022)   Exercise Vital Sign    Days of Exercise per Week: 3 days    Minutes of Exercise per Session: 60 min  Stress: No Stress Concern Present (08/23/2022)   [employer] of Occupational Health - Occupational Stress Questionnaire    Feeling of Stress : Not at all  Social Connections: Socially Integrated (08/23/2022)   Social Connection and Isolation Panel [NHANES]    Frequency of Communication with Friends and Family: More than three times a week    Frequency of Social Gatherings with Friends and Family: Three times a week    Attends Religious Services: More than 4 times per year    Active Member of Clubs or Organizations: Yes    Attends Banker Meetings: More than 4 times per year    Marital Status: Married       Objective:   Physical Exam Vitals reviewed.  Constitutional:      General: He is not in acute distress.    Appearance: He is well-developed. He is obese.  HENT:     Head: Normocephalic.     Right Ear: Tympanic membrane normal.     Left Ear: Tympanic membrane normal.  Eyes:     General:        Right eye: No  discharge.        Left eye: No discharge.     Pupils: Pupils are equal, round, and reactive to light.  Neck:     Thyroid: No thyromegaly.  Cardiovascular:     Rate and Rhythm: Normal rate and regular rhythm.     Heart sounds: Normal heart sounds. No murmur heard. Pulmonary:     Effort: Pulmonary effort is normal. No respiratory distress.     Breath sounds: Normal breath sounds. No wheezing.  Abdominal:     General: Bowel sounds are normal. There is no distension.     Palpations: Abdomen is soft.     Tenderness: There is no abdominal tenderness.  Musculoskeletal:        General: No tenderness. Normal range of motion.     Cervical back: Normal range of motion and neck supple.  Right lower leg: Edema (trace) present.     Left lower leg: Edema (trace) present.     Comments: PAD, discoloration of left lower leg  Skin:    General: Skin is warm and dry.     Findings: No erythema or rash.  Neurological:     Mental Status: He is alert and oriented to person, place, and time.     Cranial Nerves: No cranial nerve deficit.     Deep Tendon Reflexes: Reflexes are normal and symmetric.  Psychiatric:        Behavior: Behavior normal.        Thought Content: Thought content normal.        Judgment: Judgment normal.     Diabetic Foot Exam - Simple   Simple Foot Form Diabetic Foot exam was performed with the following findings: Yes 04/26/2023  9:24 AM  Visual Inspection No deformities, no ulcerations, no other skin breakdown bilaterally: Yes Sensation Testing Intact to touch and monofilament testing bilaterally: Yes Pulse Check Posterior Tibialis and Dorsalis pulse intact bilaterally: Yes Comments      BP 124/71   Pulse 79   Temp 98.7 F (37.1 C) (Temporal)   Ht 5\' 10"  (1.778 m)   Wt 272 lb (123.4 kg)   SpO2 95%   BMI 39.03 kg/m      Assessment & Plan:  Adam Barrera comes in today with chief complaint of Medical Management of Chronic Issues and Covid Positive (On 07/19  still stuffy )   Diagnosis and orders addressed:  1. Chronic obstructive pulmonary disease, unspecified COPD type (HCC) - albuterol (VENTOLIN HFA) 108 (90 Base) MCG/ACT inhaler; TAKE 2 PUFFS BY MOUTH EVERY 6 HOURS AS NEEDED FOR WHEEZE OR SHORTNESS OF BREATH  Dispense: 18 g; Refill: 2 - CBC with Differential/Platelet - CMP14+EGFR  2. Wheezing - albuterol (VENTOLIN HFA) 108 (90 Base) MCG/ACT inhaler; TAKE 2 PUFFS BY MOUTH EVERY 6 HOURS AS NEEDED FOR WHEEZE OR SHORTNESS OF BREATH  Dispense: 18 g; Refill: 2 - CBC with Differential/Platelet - CMP14+EGFR  3. Essential hypertension, benign - losartan (COZAAR) 100 MG tablet; Take 1 tablet (100 mg total) by mouth daily.  Dispense: 90 tablet; Refill: 0 - CBC with Differential/Platelet - CMP14+EGFR  4. Type 2 diabetes mellitus with other specified complication, without long-term current use of insulin (HCC) - metFORMIN (GLUCOPHAGE) 1000 MG tablet; Take 1 tablet (1,000 mg total) by mouth 2 (two) times daily.  Dispense: 180 tablet; Refill: 3 - Bayer DCA Hb A1c Waived - CBC with Differential/Platelet - CMP14+EGFR - Microalbumin / creatinine urine ratio  5. OAB (overactive bladder) Will increase Oxybutynin to 15 mg from 10 mg  Referral to urologists pending  - CBC with Differential/Platelet - CMP14+EGFR - oxybutynin (DITROPAN XL) 15 MG 24 hr tablet; Take 1 tablet (15 mg total) by mouth at bedtime.  Dispense: 90 tablet; Refill: 1 - Ambulatory referral to Urology  6. Aortic atherosclerosis (HCC) - rosuvastatin (CRESTOR) 20 MG tablet; Take 1 tablet (20 mg total) by mouth daily.  Dispense: 90 tablet; Refill: 3 - CBC with Differential/Platelet - CMP14+EGFR - Lipid panel  7. Hyperlipidemia associated with type 2 diabetes mellitus (HCC) - rosuvastatin (CRESTOR) 20 MG tablet; Take 1 tablet (20 mg total) by mouth daily.  Dispense: 90 tablet; Refill: 3 - CBC with Differential/Platelet - CMP14+EGFR - Lipid panel  8. Benign prostatic hyperplasia  with urinary frequency - tamsulosin (FLOMAX) 0.4 MG CAPS capsule; Take 1 capsule (0.4 mg total) by mouth daily.  Dispense:  90 capsule; Refill: 0 - CBC with Differential/Platelet - CMP14+EGFR  9. PAD (peripheral artery disease) (HCC) - CBC with Differential/Platelet - CMP14+EGFR  10. Annual physical exam - Bayer DCA Hb A1c Waived - CBC with Differential/Platelet - CMP14+EGFR - Lipid panel - TSH - PSA, total and free  11. Morbid obesity (HCC)  12. Hypertension associated with diabetes (HCC)  13. Primary osteoarthritis of left knee  14. Simple chronic bronchitis (HCC)   Labs pending Health Maintenance reviewed Diet and exercise encouraged  Follow up plan: 6 month    Jannifer Rodney, FNP

## 2023-04-26 NOTE — Patient Instructions (Signed)
Health Maintenance After Age 70 After age 70, you are at a higher risk for certain long-term diseases and infections as well as injuries from falls. Falls are a major cause of broken bones and head injuries in people who are older than age 70. Getting regular preventive care can help to keep you healthy and well. Preventive care includes getting regular testing and making lifestyle changes as recommended by your health care provider. Talk with your health care provider about: Which screenings and tests you should have. A screening is a test that checks for a disease when you have no symptoms. A diet and exercise plan that is right for you. What should I know about screenings and tests to prevent falls? Screening and testing are the best ways to find a health problem early. Early diagnosis and treatment give you the best chance of managing medical conditions that are common after age 70. Certain conditions and lifestyle choices may make you more likely to have a fall. Your health care provider may recommend: Regular vision checks. Poor vision and conditions such as cataracts can make you more likely to have a fall. If you wear glasses, make sure to get your prescription updated if your vision changes. Medicine review. Work with your health care provider to regularly review all of the medicines you are taking, including over-the-counter medicines. Ask your health care provider about any side effects that may make you more likely to have a fall. Tell your health care provider if any medicines that you take make you feel dizzy or sleepy. Strength and balance checks. Your health care provider may recommend certain tests to check your strength and balance while standing, walking, or changing positions. Foot health exam. Foot pain and numbness, as well as not wearing proper footwear, can make you more likely to have a fall. Screenings, including: Osteoporosis screening. Osteoporosis is a condition that causes  the bones to get weaker and break more easily. Blood pressure screening. Blood pressure changes and medicines to control blood pressure can make you feel dizzy. Depression screening. You may be more likely to have a fall if you have a fear of falling, feel depressed, or feel unable to do activities that you used to do. Alcohol use screening. Using too much alcohol can affect your balance and may make you more likely to have a fall. Follow these instructions at home: Lifestyle Do not drink alcohol if: Your health care provider tells you not to drink. If you drink alcohol: Limit how much you have to: 0-1 drink a day for women. 0-2 drinks a day for men. Know how much alcohol is in your drink. In the [place] one drink equals one 12 oz bottle of beer (355 mL), one 5 oz glass of wine (148 mL), or one 1 oz glass of hard liquor (44 mL). Do not use any products that contain nicotine or tobacco. These products include cigarettes, chewing tobacco, and vaping devices, such as e-cigarettes. If you need help quitting, ask your health care provider. Activity  Follow a regular exercise program to stay fit. This will help you maintain your balance. Ask your health care provider what types of exercise are appropriate for you. If you need a cane or walker, use it as recommended by your health care provider. Wear supportive shoes that have nonskid soles. Safety  Remove any tripping hazards, such as rugs, cords, and clutter. Install safety equipment such as grab bars in bathrooms and safety rails on stairs. Keep rooms and walkways   well-lit. General instructions Talk with your health care provider about your risks for falling. Tell your health care provider if: You fall. Be sure to tell your health care provider about all falls, even ones that seem minor. You feel dizzy, tiredness (fatigue), or off-balance. Take over-the-counter and prescription medicines only as told by your health care provider. These include  supplements. Eat a healthy diet and maintain a healthy weight. A healthy diet includes low-fat dairy products, low-fat (lean) meats, and fiber from whole grains, beans, and lots of fruits and vegetables. Stay current with your vaccines. Schedule regular health, dental, and eye exams. Summary Having a healthy lifestyle and getting preventive care can help to protect your health and wellness after age 70. Screening and testing are the best way to find a health problem early and help you avoid having a fall. Early diagnosis and treatment give you the best chance for managing medical conditions that are more common for people who are older than age 70. Falls are a major cause of broken bones and head injuries in people who are older than age 70. Take precautions to prevent a fall at home. Work with your health care provider to learn what changes you can make to improve your health and wellness and to prevent falls. This information is not intended to replace advice given to you by your health care provider. Make sure you discuss any questions you have with your health care provider. Document Revised: 01/31/2021 Document Reviewed: 01/31/2021 Elsevier Patient Education  2024 [employer] Inc.  

## 2023-04-27 ENCOUNTER — Other Ambulatory Visit: Payer: Self-pay | Admitting: Family

## 2023-04-27 MED ORDER — DAPAGLIFLOZIN PROPANEDIOL 5 MG PO TABS: 5.0000 mg | ORAL_TABLET | Freq: Every day | ORAL | 1 refills | Status: DC

## 2023-05-02 ENCOUNTER — Telehealth: Payer: Self-pay | Admitting: Family

## 2023-05-02 NOTE — Telephone Encounter (Signed)
Patient states that he went to get the farxiga and it was $90.  He would like to know if something else has been called in.

## 2023-05-02 NOTE — Telephone Encounter (Signed)
Patient calling because one of his medications that was called in was over $90 and he is not able to afford  this. He was unsure of the name but thinks it may be for his pre diabetic diagnosis. Please call back and advise.

## 2023-05-03 NOTE — Telephone Encounter (Signed)
Ok for patient not to start [place]. A1c is at goal. Continue low carb diet and continue metformin.   Jannifer Rodney, FNP

## 2023-05-03 NOTE — Telephone Encounter (Signed)
Patient aware and verbalized understanding. °

## 2023-05-03 NOTE — Addendum Note (Signed)
Addended by: Jannifer Rodney A on: 05/03/2023 09:41 AM   Modules accepted: Orders

## 2023-05-10 ENCOUNTER — Other Ambulatory Visit: Payer: Self-pay | Admitting: Physical Medicine and Rehabilitation
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
## 2023-05-22 DIAGNOSIS — H353131 Nonexudative age-related macular degeneration, bilateral, early dry stage: Secondary | ICD-10-CM | POA: Diagnosis not present

## 2023-05-22 LAB — HM DIABETES EYE EXAM

## 2023-05-24 ENCOUNTER — Encounter: Payer: Self-pay | Admitting: Urology

## 2023-05-24 ENCOUNTER — Ambulatory Visit (INDEPENDENT_AMBULATORY_CARE_PROVIDER_SITE_OTHER): Payer: HMO | Admitting: Urology

## 2023-05-24 VITALS — BP 128/81 | HR 93 | Temp 98.5°F

## 2023-05-24 DIAGNOSIS — N3281 Overactive bladder: Secondary | ICD-10-CM | POA: Diagnosis not present

## 2023-05-24 DIAGNOSIS — R350 Frequency of micturition: Secondary | ICD-10-CM | POA: Diagnosis not present

## 2023-05-24 DIAGNOSIS — Z87891 Personal history of nicotine dependence: Secondary | ICD-10-CM | POA: Diagnosis not present

## 2023-05-24 DIAGNOSIS — E1169 Type 2 diabetes mellitus with other specified complication: Secondary | ICD-10-CM | POA: Diagnosis not present

## 2023-05-24 DIAGNOSIS — N401 Enlarged prostate with lower urinary tract symptoms: Secondary | ICD-10-CM

## 2023-05-24 LAB — URINALYSIS, ROUTINE W REFLEX MICROSCOPIC
Bilirubin, UA: NEGATIVE
Glucose, UA: NEGATIVE
Ketones, UA: NEGATIVE
Leukocytes,UA: NEGATIVE
Nitrite, UA: NEGATIVE
RBC, UA: NEGATIVE
Specific Gravity, UA: 1.02 (ref 1.005–1.030)
Urobilinogen, Ur: 1 mg/dL (ref 0.2–1.0)
pH, UA: 6 (ref 5.0–7.5)

## 2023-05-24 LAB — MICROSCOPIC EXAMINATION
Bacteria, UA: NONE SEEN
Epithelial Cells (non renal): NONE SEEN /hpf (ref 0–10)
RBC, Urine: NONE SEEN /hpf (ref 0–2)

## 2023-05-24 LAB — BLADDER SCAN AMB NON-IMAGING: Scan Result: 0

## 2023-05-24 MED ORDER — OXYBUTYNIN CHLORIDE ER 10 MG PO TB24: 10.0000 mg | ORAL_TABLET | Freq: Two times a day (BID) | ORAL | 0 refills | Status: DC

## 2023-05-24 NOTE — Progress Notes (Signed)
Uroflow  Peak Flow: 28ml Average Flow: 8ml Voided Volume: Voiding Time: 59sec Flow Time: 15sec Time to Peak Flow: 7sec  PVR Volume: 0ml

## 2023-05-24 NOTE — Patient Instructions (Signed)
Overactive bladder (OAB) overview for patients:  Symptoms may include: urinary urgency ("gotta go" feeling) urinary frequency (voiding >8 times per day) night time urination (nocturia) urge incontinence of urine (UUI)  While we do not know the exact etiology of OAB, several treatment options exist including:  Behavioral therapy: Reducing fluid intake Decreasing bladder stimulants (such as caffeine) and irritants (such as acidic food, spicy foods, alcohol) Urge suppression strategies Bladder retraining via timed voiding Pelvic floor physical therapy  Medication(s) - can use one or both of the drug classes below. Anticholinergic / antimuscarinic medications:  Mechanism of action: Activate M3 receptors to reduce detrusor stimulation and increase bladder capacity  (parasympathetic nervous system). Effect: Relaxes the bladder to decrease overactivity, increase bladder storage capacity, and increase time between voids. Onset: Slow acting (may take 8-12 weeks to determine efficacy). Medications include: Vesicare (Solifenacin), Ditropan (Oxybutynin), Detrol (Tolterodine), Toviaz (Fesoterodine), Sanctura (Trospium), Urispas (Flavoxate), Enablex (Darifenacin), Bentyl (Dicyclomine), Levsin (Hyoscyamine ). Potential side effects include but are not limited to: Dry eyes, dry mouth, constipation, cognitive impairment, dementia risk with long term use, and urinary retention/ incomplete bladder emptying. Insurance companies generally prefer for patients to try 1-2 anticholinergic / antimuscarinic medications first due to low cost. Some exceptions are made based on patient-specific comorbidities / risk factors. Beta-3 agonist medications: Mechanism of action: Stimulates selective B3 adrenergic receptors to cause smooth muscle bladder relaxation (sympathetic nervous system). Effect: Relaxes the bladder to decrease overactivity, increase bladder storage capacity, and increase time between voids. Onset:  Slow acting (may take 8-12 weeks to determine efficacy). Medications include: Myrbetriq (Mirabegron) and Vibegron (Gemtesa). Potential side effects include but are not limited to: urinary retention / incomplete bladder emptying and elevated blood pressure (more likely to occur in individuals with pre-existing uncontrolled hypertension). These medications tend to be more expensive than the anticholinergic / antimuscarinic medications.   For patients with refractory OAB (if the above treatment options have been unsuccessful): Posterior tibial nerve stimulation (PTNS). Small acupuncture-type needle inserted near ankle with electric current to stimulate bladder via posterior tibial nerve pathway. Initially requires 12 weekly in-office treatments lasting 30 minutes each; followed by monthly in-office treatments lasting 30 minutes each for 1 year.  Bladder Botox injections. How it is done: Typically done via in-office cystoscopy; sometimes done in the OR depending on the situation. The bladder is numbed with lidocaine instilled via a catheter. Then the urologist injects Botox into the bladder muscle wall in about 20 locations. Causes local paralysis of the bladder muscle at the injection sites to reduce bladder muscle overactivity / spasms. The effect lasts for approximately 6 months and cannot be reversed once performed. Risks may included but are not limited to: infection, incomplete bladder emptying/ urinary retention, short term need for self-catheterization or indwelling catheter, and need for repeat therapy. There is a 5-12% chance of needing to catheterize with Botox - that usually resolves in a few months as the Botox wears off. Typically Botox injections would need to be repeated every 3-12 months since this is not a permanent therapy.  Sacral neuromodulation trial (Medtronic lnterStim or Axonics implant). Sacral neuromodulation is FDA-approved for uncontrolled urinary urgency, urinary frequency,  urinary urge incontinence, non-obstructive urinary retention, or fecal incontinence. It is not FDA-approved as a treatment for pain. The goal of this therapy is at least a 50% improvement in symptoms. It is NOT realistic to expect a 100% cure. This is a a 2-step outpatient procedure. After a successful test period, a permanent wire and generator are placed   in the OR. We discussed the risk of infection. We reviewed the fact that about 30% of patients fail the test phase and are not candidates for permanent generator placement. During the 1-2 week trial phase, symptoms are documented by the patient to determine response. If patient gets at least a 50% improvement in symptoms, they may then proceed with Step 2. Step 1: Trial lead placement. Per physician discretion, may done one of two ways: Percutaneous nerve evaluation (PNE) in the [facility] office. Performed by urologist under local anesthesia (numbing the area with lidocaine) using a spinal needle for placement of test wire, which usually stays in place for 5-7 days to determine therapy response. Test lead placement in OR under anesthesia. Usually stays in place 2 weeks to determine therapy response. > Step 2: Permanent implantation of sacral neuromodulation device, which is performed in the OR.  Sacral neuromodulation implants: All are conditionally MRI safe. Manufacturer: Medtronic Website: www.medtronic.com/[place]-en/patients/treatments-therapies/neurostimulator-overactive-bladder/getting therapy/right-for-you.html Options: lnterStim X: Non-rechargeable. The battery lasts 10 years on average. lnterStim Micro: Rechargeable. The battery lasts 15 years on average and must be charged routinely. Approximately 50% smaller implant than lnterStim X implant.  Manufacturer: Axonics Website: Findrealrelief.axonics.com Options: Non-rechargeable (Axonics F15): The battery lasts 15 years on average. Rechargeable (Axonics R20): The battery lasts 20 years on  average and must be charged in office for about 1 hour every 6-10 months on average. Approximately 50% smaller implant than Axonics non-rechargeable implant.  Note: Generally the rechargeable devices are only advised for very small or thin patients who may not have sufficient adipose tissue to comfortably overlay the implanted device.  Suprapubic catheter (SP tube) placement. Only done in severely refractory OAB when all other options have failed or are not a viable treatment choice depending on patient factors. Involves placement of a catheter through the lower abdomen into the bladder to continuously drain the bladder into an external collection bag, which patient can then empty at their convenience every few hours. Done via an outpatient surgical procedure in the OR under anesthesia. Risks may included but are not limited to: surgical site pain, infections, skin irritation / breakdown, chronic bacteriuria, symptomatic UTls. The SP tube must stay in place continuously. This is a reversible procedure however - the insertion site will close if catheter is removed for more than a few hours. The SP tube must be exchanged routinely every 4 weeks to prevent the catheter from becoming clogged with sediment. SP tube exchanges are typically performed at a urology nurse visit or by a home health nurse.  
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
## 2023-06-21 ENCOUNTER — Encounter: Payer: Self-pay | Admitting: Urology

## 2023-06-21 ENCOUNTER — Ambulatory Visit (INDEPENDENT_AMBULATORY_CARE_PROVIDER_SITE_OTHER): Payer: HMO | Admitting: Urology

## 2023-06-21 VITALS — BP 130/78 | HR 51 | Temp 98.5°F

## 2023-06-21 DIAGNOSIS — R350 Frequency of micturition: Secondary | ICD-10-CM | POA: Diagnosis not present

## 2023-06-21 DIAGNOSIS — N3281 Overactive bladder: Secondary | ICD-10-CM

## 2023-06-21 DIAGNOSIS — R351 Nocturia: Secondary | ICD-10-CM

## 2023-06-21 DIAGNOSIS — N401 Enlarged prostate with lower urinary tract symptoms: Secondary | ICD-10-CM

## 2023-06-21 LAB — URINALYSIS, ROUTINE W REFLEX MICROSCOPIC
Bilirubin, UA: NEGATIVE
Glucose, UA: NEGATIVE
Ketones, UA: NEGATIVE
Leukocytes,UA: NEGATIVE
Nitrite, UA: NEGATIVE
Specific Gravity, UA: 1.025 (ref 1.005–1.030)
Urobilinogen, Ur: 2 mg/dL — ABNORMAL HIGH (ref 0.2–1.0)
pH, UA: 6 (ref 5.0–7.5)

## 2023-06-21 LAB — MICROSCOPIC EXAMINATION
Bacteria, UA: NONE SEEN
WBC, UA: NONE SEEN /hpf (ref 0–5)

## 2023-06-21 LAB — BLADDER SCAN AMB NON-IMAGING: Scan Result: 13

## 2023-06-21 MED ORDER — OXYBUTYNIN CHLORIDE ER 10 MG PO TB24: 10.0000 mg | ORAL_TABLET | Freq: Two times a day (BID) | ORAL | 11 refills | Status: DC

## 2023-08-08 ENCOUNTER — Other Ambulatory Visit: Payer: Self-pay | Admitting: Physical Medicine and Rehabilitation

## 2023-08-08 ENCOUNTER — Other Ambulatory Visit: Payer: Self-pay | Admitting: Family

## 2023-08-08 DIAGNOSIS — N401 Enlarged prostate with lower urinary tract symptoms: Secondary | ICD-10-CM

## 2023-08-08 DIAGNOSIS — N3281 Overactive bladder: Secondary | ICD-10-CM

## 2023-08-08 DIAGNOSIS — I10 Essential (primary) hypertension: Secondary | ICD-10-CM

## 2023-08-29 ENCOUNTER — Ambulatory Visit: Payer: HMO

## 2023-08-29 VITALS — Ht 70.0 in | Wt 272.0 lb

## 2023-08-29 DIAGNOSIS — Z0000 Encounter for general adult medical examination without abnormal findings: Secondary | ICD-10-CM | POA: Diagnosis not present

## 2023-08-29 NOTE — Patient Instructions (Signed)
Adam Barrera , Thank you for taking time to come for your Medicare Wellness Visit. I appreciate your ongoing commitment to your health goals. Please review the following plan we discussed and let me know if I can assist you in the future.   Referrals/Orders/Follow-Ups/Clinician Recommendations: Aim for 30 minutes of exercise or brisk walking, 6-8 glasses of water, and 5 servings of fruits and vegetables each day.  This is a list of the screening recommended for you and due dates:  Health Maintenance  Topic Date Due   COVID-19 Vaccine (4 - 2023-24 season) 05/27/2023   Flu Shot  12/24/2023*   Hemoglobin A1C  10/27/2023   Screening for Lung Cancer  11/10/2023   Yearly kidney function blood test for diabetes  04/25/2024   Yearly kidney health urinalysis for diabetes  04/25/2024   Complete foot exam   04/25/2024   Eye exam for diabetics  05/21/2024   Medicare Annual Wellness Visit  08/28/2024   Colon Cancer Screening  12/08/2029   DTaP/Tdap/Td vaccine (2 - Td or Tdap) 04/25/2032   Pneumonia Vaccine  Completed   Hepatitis C Screening  Completed   Zoster (Shingles) Vaccine  Completed   HPV Vaccine  Aged Out  *Topic was postponed. The date shown is not the original due date.    Advanced directives: (ACP Link)Information on Advanced Care Planning can be found at [facility] [place] Advance Health Care Directives Advance Health Care Directives ([email or web address]   Next Medicare Annual Wellness Visit scheduled for next year: Yes
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
## 2023-09-10 ENCOUNTER — Telehealth: Payer: Self-pay | Admitting: Family

## 2023-09-10 DIAGNOSIS — J209 Acute bronchitis, unspecified: Secondary | ICD-10-CM | POA: Diagnosis not present

## 2023-09-10 DIAGNOSIS — Z20822 Contact with and (suspected) exposure to covid-19: Secondary | ICD-10-CM | POA: Diagnosis not present

## 2023-09-10 DIAGNOSIS — J0100 Acute maxillary sinusitis, unspecified: Secondary | ICD-10-CM | POA: Diagnosis not present

## 2023-09-10 NOTE — Telephone Encounter (Signed)
Return call. Pt on the way to UC.  Copied from CRM [id number]. Topic: Appointments - Appointment Scheduling >> Sep 10, 2023  9:50 AM Desma Mcgregor wrote: Patient/patient representative is calling to schedule an appointment. No appts are available for today for any provider. Patient has persistent coughing and congestion. Monique requesting a call back if a spot opens up. ER and UC suggested. [phone]

## 2023-09-11 DIAGNOSIS — H524 Presbyopia: Secondary | ICD-10-CM | POA: Diagnosis not present

## 2023-09-11 DIAGNOSIS — H35033 Hypertensive retinopathy, bilateral: Secondary | ICD-10-CM | POA: Diagnosis not present

## 2023-10-04 ENCOUNTER — Other Ambulatory Visit: Payer: Self-pay | Admitting: Family

## 2023-10-04 ENCOUNTER — Other Ambulatory Visit: Payer: Self-pay | Admitting: Physical Medicine and Rehabilitation

## 2023-10-30 ENCOUNTER — Ambulatory Visit (INDEPENDENT_AMBULATORY_CARE_PROVIDER_SITE_OTHER): Payer: HMO | Admitting: Family

## 2023-10-30 ENCOUNTER — Encounter: Payer: Self-pay | Admitting: Family

## 2023-10-30 VITALS — BP 101/65 | HR 80 | Temp 97.9°F | Wt 277.2 lb

## 2023-10-30 DIAGNOSIS — Z23 Encounter for immunization: Secondary | ICD-10-CM | POA: Diagnosis not present

## 2023-10-30 DIAGNOSIS — N3281 Overactive bladder: Secondary | ICD-10-CM

## 2023-10-30 DIAGNOSIS — N401 Enlarged prostate with lower urinary tract symptoms: Secondary | ICD-10-CM | POA: Diagnosis not present

## 2023-10-30 DIAGNOSIS — R350 Frequency of micturition: Secondary | ICD-10-CM

## 2023-10-30 DIAGNOSIS — E1159 Type 2 diabetes mellitus with other circulatory complications: Secondary | ICD-10-CM

## 2023-10-30 DIAGNOSIS — J61 Pneumoconiosis due to asbestos and other mineral fibers: Secondary | ICD-10-CM | POA: Diagnosis not present

## 2023-10-30 DIAGNOSIS — I152 Hypertension secondary to endocrine disorders: Secondary | ICD-10-CM | POA: Diagnosis not present

## 2023-10-30 DIAGNOSIS — I700 Atherosclerosis of aorta: Secondary | ICD-10-CM

## 2023-10-30 DIAGNOSIS — E1169 Type 2 diabetes mellitus with other specified complication: Secondary | ICD-10-CM | POA: Diagnosis not present

## 2023-10-30 DIAGNOSIS — J410 Simple chronic bronchitis: Secondary | ICD-10-CM | POA: Diagnosis not present

## 2023-10-30 DIAGNOSIS — E785 Hyperlipidemia, unspecified: Secondary | ICD-10-CM | POA: Diagnosis not present

## 2023-10-30 LAB — BAYER DCA HB A1C WAIVED: HB A1C (BAYER DCA - WAIVED): 6.4 % — ABNORMAL HIGH (ref 4.8–5.6)

## 2023-10-30 NOTE — Progress Notes (Signed)
 Subjective:    Patient ID: Adam Barrera, male    DOB: 1951/07/16, 73 y.o.   MRN: [id number]  Chief Complaint  Patient presents with   Follow-up    6 MONTH FOLLOW UP. WANTS TO GET FLU SHOT TODAY    Pt presents to the office today for chronic follow up.  He was followed by Pulmonologist for asbestosis and pulmonary nodules,  but has not seen them in several years.    Has CKD and tries to avoid NSAID's.    He is morbid obese with a BMI of 39 and DM and HTN.   Has aortic atherosclerosis and takes Crestor  daily.  Diabetes He presents for his follow-up diabetic visit. He has type 2 diabetes mellitus. Pertinent negatives for diabetes include no blurred vision and no foot paresthesias. Symptoms are stable. Risk factors for coronary artery disease include dyslipidemia, diabetes mellitus, hypertension and sedentary lifestyle. He is following a generally healthy diet. His overall blood glucose range is 110-130 mg/dl. Eye exam is current.  Hypertension This is a chronic problem. The current episode started more than 1 year ago. The problem has been resolved since onset. The problem is controlled. Associated symptoms include malaise/fatigue and peripheral edema. Pertinent negatives include no blurred vision or shortness of breath. Risk factors for coronary artery disease include dyslipidemia, male gender and sedentary lifestyle. The current treatment provides moderate improvement.  Hyperlipidemia This is a chronic problem. The current episode started more than 1 year ago. The problem is controlled. Recent lipid tests were reviewed and are normal. Exacerbating diseases include obesity. Pertinent negatives include no shortness of breath. Current antihyperlipidemic treatment includes statins. The current treatment provides moderate improvement of lipids. Risk factors for coronary artery disease include dyslipidemia, diabetes mellitus, hypertension, male sex and a sedentary lifestyle.  Arthritis Presents  for follow-up visit. He complains of pain and stiffness. Affected locations include the left knee and right knee (back). His pain is at a severity of 2/10.  Urinary Frequency  This is a chronic problem. The current episode started more than 1 year ago. Associated symptoms include frequency and urgency. Treatments tried: oxybutynin .  Benign Prostatic Hypertrophy This is a chronic problem. The current episode started more than 1 year ago. Irritative symptoms include frequency, nocturia (3) and urgency. Past treatments include tamsulosin . The treatment provided moderate relief.      Review of Systems  Constitutional:  Positive for malaise/fatigue.  Eyes:  Negative for blurred vision.  Respiratory:  Negative for shortness of breath.   Genitourinary:  Positive for frequency, nocturia (3) and urgency.  Musculoskeletal:  Positive for stiffness.  All other systems reviewed and are negative.  Family History  Problem Relation Age of Onset   Prostate cancer Other        family history    Prostate cancer Father    Colon cancer Father    Cancer Father    Hypertension Father    Diabetes Mother    Stroke Mother    Alzheimer's disease Mother    Hypertension Mother    Stomach cancer Brother 17   Cancer Brother    Colon cancer Brother 25    Social History   Socioeconomic History   Marital status: Married    Spouse name: Mercer   Number of children: 2   Years of education: 14   Highest education level: Associate degree: occupational, scientist, product/process development, or vocational program  Occupational History   Occupation: Retired from [employer]  Tobacco Use   Smoking status:  Former    Current packs/day: 0.00    Average packs/day: 1 pack/day for 30.0 years (30.0 ttl pk-yrs)    Types: Cigarettes    Start date: 07/28/1985    Quit date: 07/29/2015    Years since quitting: 8.2   Smokeless tobacco: Never  Vaping Use   Vaping status: Never Used  Substance and Sexual Activity   Alcohol use: No   Drug use: No    Sexual activity: Yes    Birth control/protection: None  Other Topics Concern   Not on file  Social History Narrative   Lives in 2 story home with wife - daughter lives with them   Involved in church - enjoys playing golf 2-3 times per week   Social Drivers of Health   Financial Resource Strain: Low Risk  (08/29/2023)   Overall Financial Resource Strain (CARDIA)    Difficulty of Paying Living Expenses: Not hard at all  Food Insecurity: No Food Insecurity (08/29/2023)   Hunger Vital Sign    Worried About Running Out of Food in the Last Year: Never true    Ran Out of Food in the Last Year: Never true  Transportation Needs: No Transportation Needs (08/29/2023)   PRAPARE - Administrator, Civil Service (Medical): No    Lack of Transportation (Non-Medical): No  Physical Activity: Insufficiently Active (08/29/2023)   Exercise Vital Sign    Days of Exercise per Week: 3 days    Minutes of Exercise per Session: 30 min  Stress: No Stress Concern Present (08/29/2023)   [employer] of Occupational Health - Occupational Stress Questionnaire    Feeling of Stress : Not at all  Social Connections: Socially Integrated (08/29/2023)   Social Connection and Isolation Panel [NHANES]    Frequency of Communication with Friends and Family: More than three times a week    Frequency of Social Gatherings with Friends and Family: Three times a week    Attends Religious Services: More than 4 times per year    Active Member of Clubs or Organizations: Yes    Attends Banker Meetings: More than 4 times per year    Marital Status: Married       Objective:   Physical Exam Vitals reviewed.  Constitutional:      General: He is not in acute distress.    Appearance: He is well-developed. He is obese.  HENT:     Head: Normocephalic.     Right Ear: Tympanic membrane normal.     Left Ear: Tympanic membrane normal.  Eyes:     General:        Right eye: No discharge.        Left  eye: No discharge.     Pupils: Pupils are equal, round, and reactive to light.  Neck:     Thyroid : No thyromegaly.  Cardiovascular:     Rate and Rhythm: Normal rate and regular rhythm.     Heart sounds: Normal heart sounds. No murmur heard. Pulmonary:     Effort: Pulmonary effort is normal. No respiratory distress.     Breath sounds: Normal breath sounds. No wheezing.  Abdominal:     General: Bowel sounds are normal. There is no distension.     Palpations: Abdomen is soft.     Tenderness: There is no abdominal tenderness.  Musculoskeletal:        General: No tenderness. Normal range of motion.     Cervical back: Normal range of motion and neck supple.  Right lower leg: Edema (trace) present.     Left lower leg: Edema (trace) present.     Comments: PAD, discoloration of left lower leg  Skin:    General: Skin is warm and dry.     Findings: No erythema or rash.  Neurological:     Mental Status: He is alert and oriented to person, place, and time.     Cranial Nerves: No cranial nerve deficit.     Deep Tendon Reflexes: Reflexes are normal and symmetric.  Psychiatric:        Behavior: Behavior normal.        Thought Content: Thought content normal.        Judgment: Judgment normal.     Diabetic Foot Exam - Simple   No data filed      BP 101/65   Pulse 80   Temp 97.9 F (36.6 C)   Wt 277 lb 3.2 oz (125.7 kg)   SpO2 96%   BMI 39.77 kg/m      Assessment & Plan:  Braidan Ricciardi Ripberger comes in today with chief complaint of Follow-up (6 MONTH FOLLOW UP. WANTS TO GET FLU SHOT TODAY )   Diagnosis and orders addressed:  1. Aortic atherosclerosis (HCC) - CMP14+EGFR  2. Benign prostatic hyperplasia with urinary frequency - CMP14+EGFR  3. Type 2 diabetes mellitus with other specified complication, without long-term current use of insulin (HCC) (Primary) - CMP14+EGFR - Bayer DCA Hb A1c Waived  4. Hyperlipidemia associated with type 2 diabetes mellitus (HCC) -  CMP14+EGFR  5. Hypertension associated with diabetes (HCC) - CMP14+EGFR  6. Simple chronic bronchitis (HCC) - CMP14+EGFR  7. OAB (overactive bladder)  - CMP14+EGFR  8. Morbid obesity (HCC) - CMP14+EGFR  9. Asbestosis (HCC) - CMP14+EGFR    Labs pending Continue current medications  Keep follow up with specialists  Health Maintenance reviewed Diet and exercise encouraged  Follow up plan: 6 month    Bari Learn, FNP

## 2023-10-30 NOTE — Patient Instructions (Signed)

## 2023-10-31 ENCOUNTER — Telehealth: Payer: Self-pay

## 2023-10-31 DIAGNOSIS — Z87891 Personal history of nicotine dependence: Secondary | ICD-10-CM

## 2023-10-31 DIAGNOSIS — Z122 Encounter for screening for malignant neoplasm of respiratory organs: Secondary | ICD-10-CM

## 2023-10-31 LAB — CMP14+EGFR
ALT: 8 [IU]/L (ref 0–44)
AST: 10 [IU]/L (ref 0–40)
Albumin: 4.2 g/dL (ref 3.9–4.9)
Alkaline Phosphatase: 60 [IU]/L (ref 44–121)
BUN/Creatinine Ratio: 9 — ABNORMAL LOW (ref 10–24)
BUN: 12 mg/dL (ref 8–27)
Bilirubin Total: 0.8 mg/dL (ref 0.0–1.2)
CO2: 23 mmol/L (ref 20–29)
Calcium: 9.1 mg/dL (ref 8.6–10.2)
Chloride: 105 mmol/L (ref 96–106)
Creatinine, Ser: 1.38 mg/dL — ABNORMAL HIGH (ref 0.76–1.27)
Globulin, Total: 2.4 g/dL (ref 1.5–4.5)
Glucose: 142 mg/dL — ABNORMAL HIGH (ref 70–99)
Potassium: 4.3 mmol/L (ref 3.5–5.2)
Sodium: 143 mmol/L (ref 134–144)
Total Protein: 6.6 g/dL (ref 6.0–8.5)
eGFR: 55 mL/min/{1.73_m2} — ABNORMAL LOW (ref >59)

## 2023-10-31 NOTE — Telephone Encounter (Signed)
.  Lung Cancer Screening Narrative/Criteria Questionnaire (Cigarette Smokers Only- No Cigars/Pipes/vapes)   Adam Barrera   SDMV:11/22/2023 at 10:00am with [name] [name], RN   April 12, 1951   LDCT: 11/27/2023 at 10:00 am at [facility]    73 y.o.   Phone: [phone]  Lung Screening Narrative (confirm age 4-77 yrs Medicare / 50-80 yrs Private pay insurance)   Insurance information:HTA/ also uses a special Card to cover scan with lawyer due to asbsetos exposure Referring Provider: Lavell, NP   This screening involves an initial phone call with a team member from our program. It is called a shared decision making visit. The initial meeting is required by  insurance and Medicare to make sure you understand the program. This appointment takes about 15-20 minutes to complete. You will complete the screening scan at your scheduled date/time.  This scan takes about 5-10 minutes to complete. You can eat and drink normally before and after the scan.  Criteria questions for Lung Cancer Screening:   Are you a current or former smoker? Former Age began smoking: 22   If you are a former smoker, what year did you quit smoking? Quit 8 years ago and one additional year (within 15 yrs)   To calculate your smoking history, I need an accurate estimate of how many packs of cigarettes you smoked per day and for how many years. (Not just the number of PPD you are now smoking)   Years smoking 41 x Packs per day 1 = Pack years 41   (at least 20 pack yrs)   (Make sure they understand that we need to know how much they have smoked in the past, not just the number of PPD they are smoking now)  Do you have a personal history of cancer?  No    Do you have a family history of cancer? Yes  (cancer type and and relative) Father Prostate  Brother  Intestinal  Are you coughing up blood?  No  Have you had unexplained weight loss of 15 lbs or more in the last 6 months? No  It looks like you meet all criteria.   When would be a good time for us  to schedule you for this screening?   Additional information: N/A

## 2023-11-05 ENCOUNTER — Other Ambulatory Visit: Payer: Self-pay | Admitting: Family

## 2023-11-05 ENCOUNTER — Other Ambulatory Visit: Payer: Self-pay | Admitting: Physical Medicine and Rehabilitation

## 2023-11-05 DIAGNOSIS — I10 Essential (primary) hypertension: Secondary | ICD-10-CM

## 2023-11-05 DIAGNOSIS — N401 Enlarged prostate with lower urinary tract symptoms: Secondary | ICD-10-CM

## 2023-11-22 ENCOUNTER — Ambulatory Visit (INDEPENDENT_AMBULATORY_CARE_PROVIDER_SITE_OTHER): Payer: HMO | Admitting: Acute Care

## 2023-11-22 DIAGNOSIS — Z87891 Personal history of nicotine dependence: Secondary | ICD-10-CM | POA: Diagnosis not present

## 2023-11-22 NOTE — Patient Instructions (Signed)

## 2023-11-22 NOTE — Progress Notes (Signed)
 Provider Attestation I agree with the documentation of the Shared Decision Making visit,  smoking cessation counseling if appropriate, and verification or eligibility for lung cancer screening as documented by the RN Nurse Navigator.   Raejean Bullock, MSN, AGACNP-BC [facility] See Amion for personal pager PCCM on call pager [phone]     Virtual Visit via Telephone Note  I connected with Adam Barrera on 11/22/23 at 10:00 AM EST by telephone and verified that I am speaking with the correct person using two identifiers.  Location: Patient: Adam Barrera Provider: Alyse Bach, RN   I discussed the limitations, risks, security and privacy concerns of performing an evaluation and management service by telephone and the availability of in person appointments. I also discussed with the patient that there may be a patient responsible charge related to this service. The patient expressed understanding and agreed to proceed.    Shared Decision Making Visit Lung Cancer Screening Program [phone])   Eligibility: Age 73 y.o. Pack Years Smoking History Calculation 41 (# packs/per year x # years smoked) Recent History of coughing up blood  no Unexplained weight loss? no ( >Than 15 pounds within the last 6 months ) Prior History Lung / other cancer no (Diagnosis within the last 5 years already requiring surveillance chest CT Scans). Smoking Status Former Smoker Former Smokers: Years since quit: 8 years  Quit Date: 2016  Visit Components: Discussion included one or more decision making aids. yes Discussion included risk/benefits of screening. yes Discussion included potential follow up diagnostic testing for abnormal scans. yes Discussion included meaning and risk of over diagnosis. yes Discussion included meaning and risk of False Positives. yes Discussion included meaning of total radiation exposure. yes  Counseling Included: Importance of  adherence to annual lung cancer LDCT screening. yes Impact of comorbidities on ability to participate in the program. yes Ability and willingness to under diagnostic treatment. yes  Smoking Cessation Counseling: Current Smokers:  Discussed importance of smoking cessation. yes Information about tobacco cessation classes and interventions provided to patient. yes Patient provided with "ticket" for LDCT Scan. no Symptomatic Patient. no  Counseling(Intermediate counseling: > three minutes) 99406 Diagnosis Code: Tobacco Use Z72.0 Asymptomatic Patient yes  Counseling (Intermediate counseling: > three minutes counseling) [id number] Former Smokers:  Discussed the importance of maintaining cigarette abstinence. yes Diagnosis Code: Personal History of Nicotine Dependence. B28.413 Information about tobacco cessation classes and interventions provided to patient. Yes Patient provided with "ticket" for LDCT Scan. no Written Order for Lung Cancer Screening with LDCT placed in Epic. Yes (CT Chest Lung Cancer Screening Low Dose W/O CM) [id number] Z12.2-Screening of respiratory organs Z87.891-Personal history of nicotine dependence   Alyse Bach, RN

## 2023-11-27 ENCOUNTER — Ambulatory Visit (HOSPITAL_BASED_OUTPATIENT_CLINIC_OR_DEPARTMENT_OTHER)
Admission: RE | Admit: 2023-11-27 | Discharge: 2023-11-27 | Disposition: A | Discharge status: Home / Self Care | Payer: HMO | Source: Ambulatory Visit | Attending: Family | Admitting: Family

## 2023-11-27 DIAGNOSIS — Z122 Encounter for screening for malignant neoplasm of respiratory organs: Secondary | ICD-10-CM | POA: Insufficient documentation

## 2023-11-27 DIAGNOSIS — Z87891 Personal history of nicotine dependence: Secondary | ICD-10-CM | POA: Diagnosis not present

## 2023-12-24 ENCOUNTER — Other Ambulatory Visit: Payer: Self-pay | Admitting: Acute Care

## 2023-12-24 DIAGNOSIS — Z122 Encounter for screening for malignant neoplasm of respiratory organs: Secondary | ICD-10-CM

## 2023-12-24 DIAGNOSIS — Z87891 Personal history of nicotine dependence: Secondary | ICD-10-CM

## 2024-01-06 ENCOUNTER — Other Ambulatory Visit: Payer: Self-pay | Admitting: Family

## 2024-01-18 DIAGNOSIS — B349 Viral infection, unspecified: Secondary | ICD-10-CM | POA: Diagnosis not present

## 2024-01-18 DIAGNOSIS — Z96612 Presence of left artificial shoulder joint: Secondary | ICD-10-CM | POA: Diagnosis not present

## 2024-01-18 DIAGNOSIS — Z20822 Contact with and (suspected) exposure to covid-19: Secondary | ICD-10-CM | POA: Diagnosis not present

## 2024-01-18 DIAGNOSIS — R062 Wheezing: Secondary | ICD-10-CM | POA: Diagnosis not present

## 2024-01-18 DIAGNOSIS — J439 Emphysema, unspecified: Secondary | ICD-10-CM | POA: Diagnosis not present

## 2024-01-18 DIAGNOSIS — R059 Cough, unspecified: Secondary | ICD-10-CM | POA: Diagnosis not present

## 2024-01-18 DIAGNOSIS — R051 Acute cough: Secondary | ICD-10-CM | POA: Diagnosis not present

## 2024-01-18 DIAGNOSIS — R0989 Other specified symptoms and signs involving the circulatory and respiratory systems: Secondary | ICD-10-CM | POA: Diagnosis not present

## 2024-02-14 ENCOUNTER — Other Ambulatory Visit: Payer: Self-pay | Admitting: Physical Medicine and Rehabilitation

## 2024-02-14 ENCOUNTER — Other Ambulatory Visit: Payer: Self-pay | Admitting: Family

## 2024-02-14 DIAGNOSIS — I700 Atherosclerosis of aorta: Secondary | ICD-10-CM

## 2024-02-14 DIAGNOSIS — E1169 Type 2 diabetes mellitus with other specified complication: Secondary | ICD-10-CM

## 2024-02-25 ENCOUNTER — Encounter: Payer: Self-pay | Admitting: Acute Care

## 2024-03-24 ENCOUNTER — Other Ambulatory Visit: Payer: Self-pay | Admitting: Physical Medicine and Rehabilitation

## 2024-04-29 ENCOUNTER — Other Ambulatory Visit: Payer: Self-pay | Admitting: Physical Medicine and Rehabilitation

## 2024-04-29 DIAGNOSIS — H353121 Nonexudative age-related macular degeneration, left eye, early dry stage: Secondary | ICD-10-CM | POA: Diagnosis not present

## 2024-04-29 LAB — HM DIABETES EYE EXAM

## 2024-05-01 ENCOUNTER — Encounter: Payer: Self-pay | Admitting: Family

## 2024-05-01 ENCOUNTER — Ambulatory Visit: Payer: HMO | Admitting: Family

## 2024-05-01 VITALS — BP 110/72 | HR 74 | Temp 98.2°F | Ht 70.0 in | Wt 282.0 lb

## 2024-05-01 DIAGNOSIS — Z0000 Encounter for general adult medical examination without abnormal findings: Secondary | ICD-10-CM | POA: Diagnosis not present

## 2024-05-01 DIAGNOSIS — J61 Pneumoconiosis due to asbestos and other mineral fibers: Secondary | ICD-10-CM | POA: Diagnosis not present

## 2024-05-01 DIAGNOSIS — M170 Bilateral primary osteoarthritis of knee: Secondary | ICD-10-CM | POA: Diagnosis not present

## 2024-05-01 DIAGNOSIS — N401 Enlarged prostate with lower urinary tract symptoms: Secondary | ICD-10-CM

## 2024-05-01 DIAGNOSIS — I700 Atherosclerosis of aorta: Secondary | ICD-10-CM

## 2024-05-01 DIAGNOSIS — I152 Hypertension secondary to endocrine disorders: Secondary | ICD-10-CM | POA: Diagnosis not present

## 2024-05-01 DIAGNOSIS — E1169 Type 2 diabetes mellitus with other specified complication: Secondary | ICD-10-CM | POA: Diagnosis not present

## 2024-05-01 DIAGNOSIS — N3281 Overactive bladder: Secondary | ICD-10-CM

## 2024-05-01 DIAGNOSIS — R5383 Other fatigue: Secondary | ICD-10-CM | POA: Diagnosis not present

## 2024-05-01 DIAGNOSIS — E1159 Type 2 diabetes mellitus with other circulatory complications: Secondary | ICD-10-CM | POA: Diagnosis not present

## 2024-05-01 DIAGNOSIS — G629 Polyneuropathy, unspecified: Secondary | ICD-10-CM

## 2024-05-01 DIAGNOSIS — J410 Simple chronic bronchitis: Secondary | ICD-10-CM

## 2024-05-01 DIAGNOSIS — R350 Frequency of micturition: Secondary | ICD-10-CM | POA: Diagnosis not present

## 2024-05-01 DIAGNOSIS — Z0001 Encounter for general adult medical examination with abnormal findings: Secondary | ICD-10-CM | POA: Diagnosis not present

## 2024-05-01 LAB — LIPID PANEL

## 2024-05-01 LAB — BAYER DCA HB A1C WAIVED: HB A1C (BAYER DCA - WAIVED): 6.8 % — ABNORMAL HIGH (ref 4.8–5.6)

## 2024-05-01 NOTE — Addendum Note (Signed)
 Addended by: MILAS KENT D on: 05/01/2024 10:04 AM   Modules accepted: Orders

## 2024-05-01 NOTE — Patient Instructions (Signed)
 Neuropathic Pain Neuropathic pain is pain caused by damage to the nerves that are responsible for certain sensations in your body (sensory nerves). Neuropathic pain can make you more sensitive to pain. Even a minor sensation can feel very painful. This is usually a long-term (chronic) condition that can be difficult to treat. The type of pain differs from person to person. It may: Start suddenly (acute), or it may develop slowly and become chronic. Come and go as damaged nerves heal, or it may stay at the same level for years. Cause emotional distress, loss of sleep, and a lower quality of life. What are the causes? The most common cause of this condition is diabetes. Many other diseases and conditions can also cause neuropathic pain. Causes of neuropathic pain can be classified as: Toxic. This is caused by medicines and chemicals. The most common causes of toxic neuropathic pain is damage from medicines that kill cancer cells (chemotherapy) or alcohol abuse. Metabolic. This can be caused by: Diabetes. Lack of vitamins like B12. Traumatic. Any injury that cuts, crushes, or stretches a nerve can cause damage and pain. Compression-related. If a sensory nerve gets trapped or compressed for a long period of time, the blood supply to the nerve can be cut off. Vascular. Many blood vessel diseases can cause neuropathic pain by decreasing blood supply and oxygen to nerves. Autoimmune. This type of pain results from diseases in which the body's defense system (immune system) mistakenly attacks sensory nerves. Examples of autoimmune diseases that can cause neuropathic pain include lupus and multiple sclerosis. Infectious. Many types of viral infections can damage sensory nerves and cause pain. Shingles infection is a common cause of this type of pain. Inherited. Neuropathic pain can be a symptom of many diseases that are passed down through families (genetic). What increases the risk? You are more likely to  develop this condition if: You have diabetes. You smoke. You drink too much alcohol. You are taking certain medicines, including chemotherapy or medicines that treat immune system disorders. What are the signs or symptoms? The main symptom is pain. Neuropathic pain is often described as: Burning. Shock-like. Stinging. Hot or cold. Itching. How is this diagnosed? No single test can diagnose neuropathic pain. It is diagnosed based on: A physical exam and your symptoms. Your health care provider will ask you about your pain. You may be asked to use a pain scale to describe how bad your pain is. Tests. These may be done to see if you have a cause and location of any nerve damage. They include: Nerve conduction studies and electromyography to test how well nerve signals travel through your nerves and muscles (electrodiagnostic testing). Skin biopsy to evaluate for small fiber neuropathy. Imaging studies, such as: X-rays. CT scan. MRI. How is this treated? Treatment for neuropathic pain may change over time. You may need to try different treatment options or a combination of treatments. Some options include: Treating the underlying cause of the neuropathy, such as diabetes, kidney disease, or vitamin deficiencies. Stopping medicines that can cause neuropathy, such as chemotherapy. Medicine to relieve pain. Medicines may include: Prescription or over-the-counter pain medicine. Anti-seizure medicine. Antidepressant medicines. Pain-relieving patches or creams that are applied to painful areas of skin. A medicine to numb the area (local anesthetic), which can be injected as a nerve block. Transcutaneous nerve stimulation. This uses electrical currents to block painful nerve signals. The treatment is painless. Alternative treatments, such as: Acupuncture. Meditation. Massage. Occupational or physical therapy. Pain management programs. Counseling. Follow  these instructions at  home: Medicines  Take over-the-counter and prescription medicines only as told by your health care provider. Ask your health care provider if the medicine prescribed to you: Requires you to avoid driving or using machinery. Can cause constipation. You may need to take these actions to prevent or treat constipation: Drink enough fluid to keep your urine pale yellow. Take over-the-counter or prescription medicines. Eat foods that are high in fiber, such as beans, whole grains, and fresh fruits and vegetables. Limit foods that are high in fat and processed sugars, such as fried or sweet foods. Lifestyle  Have a good support system at home. Consider joining a chronic pain support group. Do not use any products that contain nicotine or tobacco. These products include cigarettes, chewing tobacco, and vaping devices, such as e-cigarettes. If you need help quitting, ask your health care provider. Do not drink alcohol. General instructions Learn as much as you can about your condition. Work closely with all your health care providers to find the treatment plan that works best for you. Ask your health care provider what activities are safe for you. Keep all follow-up visits. This is important. Contact a health care provider if: Your pain treatments are not working. You are having side effects from your medicines. You are struggling with tiredness (fatigue), mood changes, depression, or anxiety. Get help right away if: You have thoughts of hurting yourself. Get help right away if you feel like you may hurt yourself or others, or have thoughts about taking your own life. Go to your nearest emergency room or: Call 911. Call the National Suicide Prevention Lifeline at 947-472-5826 or 988. This is open 24 hours a day. Text the Crisis Text Line at 657-070-3362. Summary Neuropathic pain is pain caused by damage to the nerves that are responsible for certain sensations in your body (sensory  nerves). Neuropathic pain may come and go as damaged nerves heal, or it may stay at the same level for years. Neuropathic pain is usually a long-term condition that can be difficult to treat. Consider joining a chronic pain support group. This information is not intended to replace advice given to you by your health care provider. Make sure you discuss any questions you have with your health care provider. Document Revised: 05/09/2021 Document Reviewed: 05/09/2021 Elsevier Patient Education  2024 [employer].

## 2024-05-01 NOTE — Progress Notes (Signed)
 Subjective:    Patient ID: Adam Barrera, male    DOB: 11/18/1950, 73 y.o.   MRN: [id number]  Chief Complaint  Patient presents with   Medical Management of Chronic Issues   Pt presents to the office today for CPE.    He was followed by Pulmonologist for asbestosis and pulmonary nodules,  but has not seen them in several years.    Has CKD and tries to avoid NSAID's.    He is morbid obese with a BMI of 40 and DM and HTN.   Has aortic atherosclerosis and takes Crestor  daily.  Complaining of numbness in bilateral elbows, left thumb, bilateral thighs, and back at times that comes and goes.  Diabetes He presents for his follow-up diabetic visit. He has type 2 diabetes mellitus. Pertinent negatives for diabetes include no blurred vision and no foot paresthesias. Symptoms are stable. Diabetic complications include nephropathy. Risk factors for coronary artery disease include dyslipidemia, diabetes mellitus, hypertension, sedentary lifestyle, male sex and obesity. He is following a generally healthy diet. His overall blood glucose range is 110-130 mg/dl. (Does not check glucose regularly) Eye exam is current.  Hypertension This is a chronic problem. The current episode started more than 1 year ago. The problem has been resolved since onset. The problem is controlled. Associated symptoms include peripheral edema. Pertinent negatives include no blurred vision, malaise/fatigue or shortness of breath. Risk factors for coronary artery disease include dyslipidemia, male gender, sedentary lifestyle and obesity. The current treatment provides moderate improvement.  Hyperlipidemia This is a chronic problem. The current episode started more than 1 year ago. The problem is controlled. Recent lipid tests were reviewed and are normal. Exacerbating diseases include obesity. Pertinent negatives include no shortness of breath. Current antihyperlipidemic treatment includes statins. The current treatment provides  moderate improvement of lipids. Risk factors for coronary artery disease include dyslipidemia, diabetes mellitus, hypertension, male sex, a sedentary lifestyle and obesity.  Arthritis Presents for follow-up visit. He complains of pain and stiffness. Affected locations include the left knee and right knee (back). His pain is at a severity of 5/10.  Urinary Frequency  This is a chronic problem. The current episode started more than 1 year ago. The patient is experiencing no pain. Associated symptoms include frequency and urgency. Treatments tried: oxybutynin .  Benign Prostatic Hypertrophy This is a chronic problem. The current episode started more than 1 year ago. Irritative symptoms include frequency, nocturia (3) and urgency. Past treatments include tamsulosin . The treatment provided moderate relief.      Review of Systems  Constitutional:  Negative for malaise/fatigue.  Eyes:  Negative for blurred vision.  Respiratory:  Negative for shortness of breath.   Genitourinary:  Positive for frequency, nocturia (3) and urgency.  Musculoskeletal:  Positive for stiffness.  All other systems reviewed and are negative.  Family History  Problem Relation Age of Onset   Prostate cancer Other        family history    Prostate cancer Father    Colon cancer Father    Cancer Father    Hypertension Father    Diabetes Mother    Stroke Mother    Alzheimer's disease Mother    Hypertension Mother    Stomach cancer Brother 51   Cancer Brother    Colon cancer Brother 25    Social History   Socioeconomic History   Marital status: Married    Spouse name: Mercer   Number of children: 2   Years of education: 78  Highest education level: Associate degree: occupational, Scientist, product/process development, or vocational program  Occupational History   Occupation: Retired from [employer]  Tobacco Use   Smoking status: Former    Current packs/day: 0.00    Average packs/day: 1 pack/day for 41.0 years (41.0 ttl pk-yrs)     Types: Cigarettes    Start date: 07/28/1974    Quit date: 07/29/2015    Years since quitting: 8.7   Smokeless tobacco: Never  Vaping Use   Vaping status: Never Used  Substance and Sexual Activity   Alcohol use: No   Drug use: No   Sexual activity: Yes    Birth control/protection: None  Other Topics Concern   Not on file  Social History Narrative   Lives in 2 story home with wife - daughter lives with them   Involved in church - enjoys playing golf 2-3 times per week   Social Drivers of Health   Financial Resource Strain: Low Risk  (08/29/2023)   Overall Financial Resource Strain (CARDIA)    Difficulty of Paying Living Expenses: Not hard at all  Food Insecurity: No Food Insecurity (08/29/2023)   Hunger Vital Sign    Worried About Running Out of Food in the Last Year: Never true    Ran Out of Food in the Last Year: Never true  Transportation Needs: No Transportation Needs (08/29/2023)   PRAPARE - Administrator, Civil Service (Medical): No    Lack of Transportation (Non-Medical): No  Physical Activity: Insufficiently Active (08/29/2023)   Exercise Vital Sign    Days of Exercise per Week: 3 days    Minutes of Exercise per Session: 30 min  Stress: No Stress Concern Present (08/29/2023)   [employer] of Occupational Health - Occupational Stress Questionnaire    Feeling of Stress : Not at all  Social Connections: Socially Integrated (08/29/2023)   Social Connection and Isolation Panel    Frequency of Communication with Friends and Family: More than three times a week    Frequency of Social Gatherings with Friends and Family: Three times a week    Attends Religious Services: More than 4 times per year    Active Member of Clubs or Organizations: Yes    Attends Banker Meetings: More than 4 times per year    Marital Status: Married       Objective:   Physical Exam Vitals reviewed.  Constitutional:      General: He is not in acute distress.     Appearance: He is well-developed. He is obese.  HENT:     Head: Normocephalic.     Right Ear: Tympanic membrane normal.     Left Ear: Tympanic membrane normal.  Eyes:     General:        Right eye: No discharge.        Left eye: No discharge.     Pupils: Pupils are equal, round, and reactive to light.  Neck:     Thyroid : No thyromegaly.  Cardiovascular:     Rate and Rhythm: Normal rate and regular rhythm.     Heart sounds: Normal heart sounds. No murmur heard. Pulmonary:     Effort: Pulmonary effort is normal. No respiratory distress.     Breath sounds: Normal breath sounds. No wheezing.  Abdominal:     General: Bowel sounds are normal. There is no distension.     Palpations: Abdomen is soft.     Tenderness: There is no abdominal tenderness.  Musculoskeletal:  General: No tenderness. Normal range of motion.     Cervical back: Normal range of motion and neck supple.     Right lower leg: Edema (trace) present.     Left lower leg: Edema (trace) present.     Comments: discoloration of left lower leg  Skin:    General: Skin is warm and dry.     Findings: No erythema or rash.  Neurological:     Mental Status: He is alert and oriented to person, place, and time.     Cranial Nerves: No cranial nerve deficit.     Deep Tendon Reflexes: Reflexes are normal and symmetric.  Psychiatric:        Behavior: Behavior normal.        Thought Content: Thought content normal.        Judgment: Judgment normal.     Diabetic Foot Exam - Simple   Simple Foot Form Diabetic Foot exam was performed with the following findings: Yes 05/01/2024  9:35 AM  Visual Inspection No deformities, no ulcerations, no other skin breakdown bilaterally: Yes Sensation Testing Intact to touch and monofilament testing bilaterally: Yes Pulse Check Posterior Tibialis and Dorsalis pulse intact bilaterally: Yes Comments      BP 110/72   Pulse 74   Temp 98.2 F (36.8 C)   Ht 5' 10 (1.778 m)   Wt 282 lb  (127.9 kg)   SpO2 97%   BMI 40.46 kg/m      Assessment & Plan:  Briceson [place] Vacca comes in today with chief complaint of Medical Management of Chronic Issues   Diagnosis and orders addressed:  1. Annual physical exam (Primary) - CMP14+EGFR - Lipid panel - Bayer DCA Hb A1c Waived - Microalbumin / creatinine urine ratio - Anemia Profile B - TSH  2. Aortic atherosclerosis (HCC) - CMP14+EGFR  3. Asbestosis (HCC)  - CMP14+EGFR  4. Primary osteoarthritis of both knees - CMP14+EGFR  5. Type 2 diabetes mellitus with other specified complication, without long-term current use of insulin (HCC) - CMP14+EGFR - Bayer DCA Hb A1c Waived - Microalbumin / creatinine urine ratio  6. OAB (overactive bladder) - CMP14+EGFR  7. Simple chronic bronchitis (HCC) - CMP14+EGFR  8. Benign prostatic hyperplasia with urinary frequency  - CMP14+EGFR  9. Hypertension associated with diabetes (HCC) - CMP14+EGFR  10. Morbid obesity (HCC) - CMP14+EGFR  11. Hyperlipidemia associated with type 2 diabetes mellitus (HCC)  - CMP14+EGFR  12. Other fatigue  - CMP14+EGFR - Anemia Profile B - TSH  13. Neuropathy - CMP14+EGFR - Anemia Profile B - TSH    Labs pending Continue current medications  Keep follow up with specialists  Health Maintenance reviewed Diet and exercise encouraged  Follow up plan: 6 month    Bari Learn, FNP

## 2024-05-02 ENCOUNTER — Ambulatory Visit: Payer: Self-pay | Admitting: Family

## 2024-05-02 LAB — ANEMIA PROFILE B
Basophils Absolute: 0 x10E3/uL (ref 0.0–0.2)
Basos: 1 %
EOS (ABSOLUTE): 0.3 x10E3/uL (ref 0.0–0.4)
Eos: 4 %
Ferritin: 99 ng/mL (ref 30–400)
Folate: 6.9 ng/mL (ref >3.0)
Hematocrit: 39 % (ref 37.5–51.0)
Hemoglobin: 12.7 g/dL — ABNORMAL LOW (ref 13.0–17.7)
Immature Grans (Abs): 0 x10E3/uL (ref 0.0–0.1)
Immature Granulocytes: 0 %
Iron Saturation: 22 (ref 15–55)
Iron: 74 ug/dL (ref 38–169)
Lymphocytes Absolute: 1.4 x10E3/uL (ref 0.7–3.1)
Lymphs: 23 %
MCH: 30.9 pg (ref 26.6–33.0)
MCHC: 32.6 g/dL (ref 31.5–35.7)
MCV: 95 fL (ref 79–97)
Monocytes Absolute: 0.3 x10E3/uL (ref 0.1–0.9)
Monocytes: 5 %
Neutrophils Absolute: 4.1 x10E3/uL (ref 1.4–7.0)
Neutrophils: 67 %
Platelets: 147 x10E3/uL — ABNORMAL LOW (ref 150–450)
RBC: 4.11 x10E6/uL — ABNORMAL LOW (ref 4.14–5.80)
RDW: 12.9 % (ref 11.6–15.4)
Retic Ct Pct: 2.2 % (ref 0.6–2.6)
Total Iron Binding Capacity: 338 ug/dL (ref 250–450)
UIBC: 264 ug/dL (ref 111–343)
Vitamin B-12: 906 pg/mL (ref 232–1245)
WBC: 6.1 x10E3/uL (ref 3.4–10.8)

## 2024-05-02 LAB — CMP14+EGFR
ALT: 11 IU/L (ref 0–44)
AST: 12 IU/L (ref 0–40)
Albumin: 4.1 g/dL (ref 3.9–4.9)
Alkaline Phosphatase: 89 IU/L (ref 44–121)
BUN/Creatinine Ratio: 10 (ref 10–24)
BUN: 13 mg/dL (ref 8–27)
Bilirubin Total: 0.9 mg/dL (ref 0.0–1.2)
CO2: 23 mmol/L (ref 20–29)
Calcium: 9 mg/dL (ref 8.6–10.2)
Chloride: 105 mmol/L (ref 96–106)
Creatinine, Ser: 1.35 mg/dL — ABNORMAL HIGH (ref 0.76–1.27)
Globulin, Total: 2.2 g/dL (ref 1.5–4.5)
Glucose: 177 mg/dL — ABNORMAL HIGH (ref 70–99)
Potassium: 5 mmol/L (ref 3.5–5.2)
Sodium: 141 mmol/L (ref 134–144)
Total Protein: 6.3 g/dL (ref 6.0–8.5)
eGFR: 56 mL/min/1.73 — ABNORMAL LOW (ref >59)

## 2024-05-02 LAB — LIPID PANEL
Cholesterol, Total: 88 mg/dL — AB (ref 100–199)
HDL: 36 mg/dL — AB (ref >39)
LDL CALC COMMENT:: 2.4 ratio (ref 0.0–5.0)
LDL Chol Calc (NIH): 36 mg/dL (ref 0–99)
Triglycerides: 74 mg/dL (ref 0–149)
VLDL Cholesterol Cal: 16 mg/dL (ref 5–40)

## 2024-05-02 LAB — MICROALBUMIN / CREATININE URINE RATIO
Creatinine, Urine: 146.8 mg/dL
Microalb/Creat Ratio: 98 mg/g{creat} — ABNORMAL HIGH (ref 0–29)
Microalbumin, Urine: 143.5 ug/mL

## 2024-05-02 LAB — TSH: TSH: 2.33 u[IU]/mL (ref 0.450–4.500)

## 2024-05-05 ENCOUNTER — Telehealth: Payer: Self-pay | Admitting: Physical Medicine and Rehabilitation

## 2024-05-05 MED ORDER — DAPAGLIFLOZIN PROPANEDIOL 10 MG PO TABS: 10.0000 mg | ORAL_TABLET | Freq: Every day | ORAL | 1 refills | Status: DC

## 2024-05-05 NOTE — Telephone Encounter (Signed)
 Patient called and ask if he could get a nerve burn or an injection in the back appointment. CB#[phone]

## 2024-05-13 ENCOUNTER — Encounter: Payer: Self-pay | Admitting: Physical Medicine and Rehabilitation

## 2024-05-13 ENCOUNTER — Ambulatory Visit: Admitting: Physical Medicine and Rehabilitation

## 2024-05-13 DIAGNOSIS — G8929 Other chronic pain: Secondary | ICD-10-CM | POA: Diagnosis not present

## 2024-05-13 DIAGNOSIS — M47817 Spondylosis without myelopathy or radiculopathy, lumbosacral region: Secondary | ICD-10-CM

## 2024-05-13 DIAGNOSIS — M47816 Spondylosis without myelopathy or radiculopathy, lumbar region: Secondary | ICD-10-CM | POA: Diagnosis not present

## 2024-05-13 DIAGNOSIS — M545 Low back pain, unspecified: Secondary | ICD-10-CM | POA: Diagnosis not present

## 2024-05-13 DIAGNOSIS — M48062 Spinal stenosis, lumbar region with neurogenic claudication: Secondary | ICD-10-CM | POA: Diagnosis not present

## 2024-05-13 DIAGNOSIS — R202 Paresthesia of skin: Secondary | ICD-10-CM
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
## 2024-05-13 NOTE — Progress Notes (Signed)
 Revaluation for Poss injection/ last injection was 2023, last RFA 2022  8/10  Lower back bilateral r>l Radiates into right leg  Soreness in upper back Right arm and right leg has numbness and tingling

## 2024-06-06 ENCOUNTER — Encounter: Admitting: Physical Medicine and Rehabilitation

## 2024-06-17 ENCOUNTER — Ambulatory Visit: Admitting: Physical Medicine and Rehabilitation

## 2024-06-17 ENCOUNTER — Other Ambulatory Visit: Payer: Self-pay

## 2024-06-17 VITALS — BP 135/86 | HR 78

## 2024-06-17 DIAGNOSIS — M47816 Spondylosis without myelopathy or radiculopathy, lumbar region: Secondary | ICD-10-CM

## 2024-06-17 MED ORDER — METHYLPREDNISOLONE ACETATE 80 MG/ML IJ SUSP
40.0000 mg | Freq: Once | INTRAMUSCULAR | Status: AC
  Administered 2024-06-17: 40 mg

## 2024-06-17 NOTE — Procedures (Signed)
 Lumbar Facet Joint Nerve Denervation  Patient: Adam Barrera      Date of Birth: 1951/08/07 MRN: [id number] PCP: Lavell Bari LABOR, FNP      Visit Date: 06/17/2024   Universal Protocol:    Date/Time: 09/23/251:52 PM  Consent Given By: the patient  Position: PRONE  Additional Comments: Vital signs were monitored before and after the procedure. Patient was prepped and draped in the usual sterile fashion. The correct patient, procedure, and site was verified.   Injection Procedure Details:   Procedure diagnoses:  1. Spondylosis without myelopathy or radiculopathy, lumbar region      Meds Administered:  Meds ordered this encounter  Medications   methylPREDNISolone  acetate (DEPO-MEDROL ) injection 40 mg     Laterality: Right  Location/Site:  L4-L5, L3 and L4 medial branches and L5-S1, L4 medial branch and L5 dorsal ramus  Needle: 18 ga.,  10mm active tip, RF Cannula  Needle Placement: Along juncture of superior articular process and transverse pocess  Findings:  -Comments:  Procedure Details: For each desired target nerve, the corresponding transverse process (sacral ala for the L5 dorsal rami) was identified and the fluoroscope was positioned to square off the endplates of the corresponding vertebral body to achieve a true AP midline view.  The beam was then obliqued 15 to 20 degrees and caudally tilted 15 to 20 degrees to line up a trajectory along the target nerves. The skin over the target of the junction of superior articulating process and transverse process (sacral ala for the L5 dorsal rami) was infiltrated with 1ml of 1% Lidocaine  without Epinephrine .  The 18 gauge 10mm active tip outer cannula was advanced in trajectory view to the target.  This procedure was repeated for each target nerve.  Then, for all levels, the outer cannula placement was fine-tuned and the position was then confirmed with bi-planar imaging.    Test stimulation was done both at sensory  and motor levels to ensure there was no radicular stimulation. The target tissues were then infiltrated with 1 ml of 1% Lidocaine  without Epinephrine . Subsequently, a percutaneous neurotomy was carried out for 90 seconds at 80 degrees Celsius.  After the completion of the lesion, 1 ml of injectate was delivered. It was then repeated for each facet joint nerve mentioned above. Appropriate radiographs were obtained to verify the probe placement during the neurotomy.   Additional Comments:  The patient tolerated the procedure well Dressing: 2 x 2 sterile gauze and Band-Aid    Post-procedure details: Patient was observed during the procedure. Post-procedure instructions were reviewed.  Patient left the clinic in stable condition.

## 2024-06-17 NOTE — Progress Notes (Signed)
 Adam Barrera - 73 y.o. male MRN [id number]  Date of birth: Dec 02, 1950  Office Visit Note: Visit Date: 06/17/2024 PCP: Lavell Bari LABOR, FNP Referred by: Lavell Bari LABOR, FNP  Subjective: Chief Complaint  Patient presents with   Lower Back - Pain   HPI:  Adam Barrera is a 73 y.o. male who comes in todayfor planned radiofrequency ablation of the Right L4-5 and L5-S1 Lumbar facet joints. This would be ablation of the corresponding medial branches and/or dorsal rami.  Patient has had double diagnostic blocks with more than 50% relief.  These are documented on pain diary.  They have had chronic back pain for quite some time, more than 3 months, which has been an ongoing situation with recalcitrant axial back pain.  They have no radicular pain.  Their axial pain is worse with standing and ambulating and on exam today with facet loading.  They have had physical therapy as well as home exercise program.  The imaging noted in the chart below indicated facet pathology. Accordingly they meet all the criteria and qualification for for radiofrequency ablation and we are going to complete this today hopefully for more longer term relief as part of comprehensive management program.   ROS Otherwise per HPI.  Assessment & Plan: Visit Diagnoses:    ICD-10-CM   1. Spondylosis without myelopathy or radiculopathy, lumbar region  M47.816 XR C-ARM NO REPORT    Radiofrequency,Lumbar    methylPREDNISolone  acetate (DEPO-MEDROL ) injection 40 mg      Plan: No additional findings.   Meds & Orders:  Meds ordered this encounter  Medications   methylPREDNISolone  acetate (DEPO-MEDROL ) injection 40 mg    Orders Placed This Encounter  Procedures   Radiofrequency,Lumbar   XR C-ARM NO REPORT    Follow-up: Return for visit to requesting provider as needed.   Procedures: No procedures performed  Lumbar Facet Joint Nerve Denervation  Patient: Adam Barrera      Date of Birth: 1950-11-22 MRN: [id number] PCP:  Lavell Bari LABOR, FNP      Visit Date: 06/17/2024   Universal Protocol:    Date/Time: 09/23/251:52 PM  Consent Given By: the patient  Position: PRONE  Additional Comments: Vital signs were monitored before and after the procedure. Patient was prepped and draped in the usual sterile fashion. The correct patient, procedure, and site was verified.   Injection Procedure Details:   Procedure diagnoses:  1. Spondylosis without myelopathy or radiculopathy, lumbar region      Meds Administered:  Meds ordered this encounter  Medications   methylPREDNISolone  acetate (DEPO-MEDROL ) injection 40 mg     Laterality: Right  Location/Site:  L4-L5, L3 and L4 medial branches and L5-S1, L4 medial branch and L5 dorsal ramus  Needle: 18 ga.,  10mm active tip, RF Cannula  Needle Placement: Along juncture of superior articular process and transverse pocess  Findings:  -Comments:  Procedure Details: For each desired target nerve, the corresponding transverse process (sacral ala for the L5 dorsal rami) was identified and the fluoroscope was positioned to square off the endplates of the corresponding vertebral body to achieve a true AP midline view.  The beam was then obliqued 15 to 20 degrees and caudally tilted 15 to 20 degrees to line up a trajectory along the target nerves. The skin over the target of the junction of superior articulating process and transverse process (sacral ala for the L5 dorsal rami) was infiltrated with 1ml of 1% Lidocaine  without Epinephrine .  The 18 gauge  10mm active tip outer cannula was advanced in trajectory view to the target.  This procedure was repeated for each target nerve.  Then, for all levels, the outer cannula placement was fine-tuned and the position was then confirmed with bi-planar imaging.    Test stimulation was done both at sensory and motor levels to ensure there was no radicular stimulation. The target tissues were then infiltrated with 1 ml of  1% Lidocaine  without Epinephrine . Subsequently, a percutaneous neurotomy was carried out for 90 seconds at 80 degrees Celsius.  After the completion of the lesion, 1 ml of injectate was delivered. It was then repeated for each facet joint nerve mentioned above. Appropriate radiographs were obtained to verify the probe placement during the neurotomy.   Additional Comments:  The patient tolerated the procedure well Dressing: 2 x 2 sterile gauze and Band-Aid    Post-procedure details: Patient was observed during the procedure. Post-procedure instructions were reviewed.  Patient left the clinic in stable condition.      Clinical History: EXAM: MRI LUMBAR SPINE WITHOUT CONTRAST   TECHNIQUE: Multiplanar, multisequence MR imaging of the lumbar spine was performed. No intravenous contrast was administered.   COMPARISON:  None Available.   FINDINGS: Segmentation:  Standard.   Alignment:  Trace anterolisthesis of L4 on L5.   Vertebrae: No fracture, evidence of discitis, or bone lesion. Scattered degenerative endplate marrow changes, including a Schmorl's node of the L4 level.   Conus medullaris and cauda equina: Conus extends to the L2 level. Conus and cauda equina appear normal. There is a fatty filum terminale.   Paraspinal and other soft tissues: Small bilateral T2 hyperintense renal lesions are favored to represent renal cysts. No further follow-up is recommended. There is mild atrophy of the paraspinal musculature.   Disc levels:   T11-T12: Moderate bilateral facet degenerative change. No spinal canal stenosis. No neural foraminal stenosis.   T12-L1: Moderate bilateral facet degenerative change. No spinal canal stenosis. No neural foraminal stenosis.   L1-L2: Moderate left and mild right facet degenerative change. No spinal canal stenosis. No neural foraminal stenosis.   L2-L3: Moderate bilateral facet degenerative change. No spinal canal stenosis. Mild bilateral  neural foraminal stenosis.   L3-L4: Moderate bilateral facet degenerative change. Ligamentum flavum hypertrophy. Circumferential disc bulge. Mild spinal canal stenosis. Mild-to-moderate bilateral neural foraminal stenosis.   L4-L5: Severe bilateral facet degenerative change. Ligamentum flavum hypertrophy. Circumferential disc bulge. Severe spinal canal stenosis. Moderate to severe bilateral neural foraminal stenosis. There is redundancy of the cauda equina nerve roots above this level.   L5-S1: Moderate bilateral facet degenerative change. Circumferential disc bulge. Mild spinal canal stenosis. Moderate bilateral neural foraminal stenosis.   IMPRESSION: 1. Multilevel degenerative changes of the lumbar spine, worst at L4-L5 where there is severe spinal canal stenosis and moderate to severe bilateral neural foraminal stenosis. There is redundancy of the cauda equina nerve roots above this level, suggestive of high-grade spinal canal stenosis. 2. Additional degenerative changes, as above.     Electronically Signed   By: Lyndall Gore M.D.   On: 07/19/2022 10:23     Objective:  VS:  HT:    WT:   BMI:     BP:135/86  HR:78bpm  TEMP: ( )  RESP:  Physical Exam Vitals and nursing note reviewed.  Constitutional:      General: He is not in acute distress.    Appearance: Normal appearance. He is obese. He is not ill-appearing.  HENT:     Head: Normocephalic and  atraumatic.     Right Ear: External ear normal.     Left Ear: External ear normal.     Nose: No congestion.  Eyes:     Extraocular Movements: Extraocular movements intact.  Cardiovascular:     Rate and Rhythm: Normal rate.     Pulses: Normal pulses.  Pulmonary:     Effort: Pulmonary effort is normal. No respiratory distress.  Abdominal:     General: There is no distension.     Palpations: Abdomen is soft.  Musculoskeletal:        General: No tenderness or signs of injury.     Cervical back: Neck supple.      Right lower leg: No edema.     Left lower leg: No edema.     Comments: Patient has good distal strength without clonus.  Skin:    Findings: No erythema or rash.  Neurological:     General: No focal deficit present.     Mental Status: He is alert and oriented to person, place, and time.     Sensory: No sensory deficit.     Motor: No weakness or abnormal muscle tone.     Coordination: Coordination normal.  Psychiatric:        Mood and Affect: Mood normal.        Behavior: Behavior normal.      Imaging: XR C-ARM NO REPORT Result Date: 06/17/2024 Please see Notes tab for imaging impression.

## 2024-06-17 NOTE — Progress Notes (Signed)
 Pain Scale   Average Pain 9 Patient advising he has chronic lower back pain that radiates bilaterally to legs. Pain is constant without relief        +Driver, -BT, -Dye Allergies.

## 2024-06-18 ENCOUNTER — Encounter: Admitting: Physical Medicine and Rehabilitation

## 2024-06-20 ENCOUNTER — Ambulatory Visit: Payer: HMO | Admitting: Urology

## 2024-06-27 ENCOUNTER — Other Ambulatory Visit: Payer: Self-pay | Admitting: Family

## 2024-06-27 DIAGNOSIS — N401 Enlarged prostate with lower urinary tract symptoms: Secondary | ICD-10-CM

## 2024-06-27 DIAGNOSIS — I10 Essential (primary) hypertension: Secondary | ICD-10-CM

## 2024-06-30 ENCOUNTER — Ambulatory Visit: Admitting: Physical Medicine and Rehabilitation

## 2024-06-30 ENCOUNTER — Other Ambulatory Visit: Payer: Self-pay

## 2024-06-30 VITALS — BP 119/76 | HR 82

## 2024-06-30 DIAGNOSIS — M47816 Spondylosis without myelopathy or radiculopathy, lumbar region: Secondary | ICD-10-CM

## 2024-06-30 MED ORDER — METHYLPREDNISOLONE ACETATE 80 MG/ML IJ SUSP
40.0000 mg | Freq: Once | INTRAMUSCULAR | Status: AC
  Administered 2024-06-30: 40 mg

## 2024-06-30 NOTE — Progress Notes (Signed)
 Pain Scale   Average Pain 7 Patient advising he has chronic lower back pain without relief        +Driver, -BT, -Dye Allergies.

## 2024-07-07 NOTE — Progress Notes (Signed)
 Adam Barrera - 73 y.o. male MRN [id number]  Date of birth: 1951-04-22  Office Visit Note: Visit Date: 06/30/2024 PCP: Lavell Bari LABOR, FNP Referred by: Lavell Bari LABOR, FNP  Subjective: Chief Complaint  Patient presents with   Lower Back - Pain   HPI:  Adam Barrera is a 73 y.o. male who comes in todayfor planned repeat radiofrequency ablation of the Left L4-5 and L5-S1  Lumbar facet joints. This would be ablation of the corresponding medial branches and/or dorsal rami.  Patient has had double diagnostic blocks with more than 70% relief.  Subsequent ablation gave them more than 6 months of over 60% relief.  They have had chronic back pain for quite some time, more than 3 months, which has been an ongoing situation with recalcitrant axial back pain.  They have no radicular pain.  Their axial pain is worse with standing and ambulating and on exam today with facet loading.  They have had physical therapy as well as home exercise program.  The imaging noted in the chart below indicated facet pathology. Accordingly they meet all the criteria and qualification for for radiofrequency ablation and we are going to complete this today hopefully for more longer term relief as part of comprehensive management program.   ROS Otherwise per HPI.  Assessment & Plan: Visit Diagnoses:    ICD-10-CM   1. Spondylosis without myelopathy or radiculopathy, lumbar region  M47.816 XR C-ARM NO REPORT    Radiofrequency,Lumbar    methylPREDNISolone  acetate (DEPO-MEDROL ) injection 40 mg      Plan: No additional findings.   Meds & Orders:  Meds ordered this encounter  Medications   methylPREDNISolone  acetate (DEPO-MEDROL ) injection 40 mg    Orders Placed This Encounter  Procedures   Radiofrequency,Lumbar   XR C-ARM NO REPORT    Follow-up: No follow-ups on file.   Procedures: No procedures performed  Lumbar Facet Joint Nerve Denervation  Patient: Adam Barrera      Date of Birth: 1950/10/18 MRN:  [id number] PCP: Lavell Bari LABOR, FNP      Visit Date: 06/30/2024   Universal Protocol:    Date/Time: 10/13/256:10 AM  Consent Given By: the patient  Position: PRONE  Additional Comments: Vital signs were monitored before and after the procedure. Patient was prepped and draped in the usual sterile fashion. The correct patient, procedure, and site was verified.   Injection Procedure Details:   Procedure diagnoses:  1. Spondylosis without myelopathy or radiculopathy, lumbar region      Meds Administered:  Meds ordered this encounter  Medications   methylPREDNISolone  acetate (DEPO-MEDROL ) injection 40 mg     Laterality: Left  Location/Site:  L4-L5, L3 and L4 medial branches and L5-S1, L4 medial branch and L5 dorsal ramus  Needle: 18 ga.,  10mm active tip, RF Cannula  Needle Placement: Along juncture of superior articular process and transverse pocess  Findings:  -Comments:  Procedure Details: For each desired target nerve, the corresponding transverse process (sacral ala for the L5 dorsal rami) was identified and the fluoroscope was positioned to square off the endplates of the corresponding vertebral body to achieve a true AP midline view.  The beam was then obliqued 15 to 20 degrees and caudally tilted 15 to 20 degrees to line up a trajectory along the target nerves. The skin over the target of the junction of superior articulating process and transverse process (sacral ala for the L5 dorsal rami) was infiltrated with 1ml of 1% Lidocaine  without Epinephrine .  The 18 gauge 10mm active tip outer cannula was advanced in trajectory view to the target.  This procedure was repeated for each target nerve.  Then, for all levels, the outer cannula placement was fine-tuned and the position was then confirmed with bi-planar imaging.    Test stimulation was done both at sensory and motor levels to ensure there was no radicular stimulation. The target tissues were then infiltrated  with 1 ml of 1% Lidocaine  without Epinephrine . Subsequently, a percutaneous neurotomy was carried out for 90 seconds at 80 degrees Celsius.  After the completion of the lesion, 1 ml of injectate was delivered. It was then repeated for each facet joint nerve mentioned above. Appropriate radiographs were obtained to verify the probe placement during the neurotomy.   Additional Comments:  No complications occurred Dressing: 2 x 2 sterile gauze and Band-Aid    Post-procedure details: Patient was observed during the procedure. Post-procedure instructions were reviewed.  Patient left the clinic in stable condition.      Clinical History: EXAM: MRI LUMBAR SPINE WITHOUT CONTRAST   TECHNIQUE: Multiplanar, multisequence MR imaging of the lumbar spine was performed. No intravenous contrast was administered.   COMPARISON:  None Available.   FINDINGS: Segmentation:  Standard.   Alignment:  Trace anterolisthesis of L4 on L5.   Vertebrae: No fracture, evidence of discitis, or bone lesion. Scattered degenerative endplate marrow changes, including a Schmorl's node of the L4 level.   Conus medullaris and cauda equina: Conus extends to the L2 level. Conus and cauda equina appear normal. There is a fatty filum terminale.   Paraspinal and other soft tissues: Small bilateral T2 hyperintense renal lesions are favored to represent renal cysts. No further follow-up is recommended. There is mild atrophy of the paraspinal musculature.   Disc levels:   T11-T12: Moderate bilateral facet degenerative change. No spinal canal stenosis. No neural foraminal stenosis.   T12-L1: Moderate bilateral facet degenerative change. No spinal canal stenosis. No neural foraminal stenosis.   L1-L2: Moderate left and mild right facet degenerative change. No spinal canal stenosis. No neural foraminal stenosis.   L2-L3: Moderate bilateral facet degenerative change. No spinal canal stenosis. Mild bilateral  neural foraminal stenosis.   L3-L4: Moderate bilateral facet degenerative change. Ligamentum flavum hypertrophy. Circumferential disc bulge. Mild spinal canal stenosis. Mild-to-moderate bilateral neural foraminal stenosis.   L4-L5: Severe bilateral facet degenerative change. Ligamentum flavum hypertrophy. Circumferential disc bulge. Severe spinal canal stenosis. Moderate to severe bilateral neural foraminal stenosis. There is redundancy of the cauda equina nerve roots above this level.   L5-S1: Moderate bilateral facet degenerative change. Circumferential disc bulge. Mild spinal canal stenosis. Moderate bilateral neural foraminal stenosis.   IMPRESSION: 1. Multilevel degenerative changes of the lumbar spine, worst at L4-L5 where there is severe spinal canal stenosis and moderate to severe bilateral neural foraminal stenosis. There is redundancy of the cauda equina nerve roots above this level, suggestive of high-grade spinal canal stenosis. 2. Additional degenerative changes, as above.     Electronically Signed   By: Lyndall Gore M.D.   On: 07/19/2022 10:23     Objective:  VS:  HT:    WT:   BMI:     BP:119/76  HR:82bpm  TEMP: ( )  RESP:  Physical Exam Vitals and nursing note reviewed.  Constitutional:      General: He is not in acute distress.    Appearance: Normal appearance. He is not ill-appearing.  HENT:     Head: Normocephalic and atraumatic.  Right Ear: External ear normal.     Left Ear: External ear normal.     Nose: No congestion.  Eyes:     Extraocular Movements: Extraocular movements intact.  Cardiovascular:     Rate and Rhythm: Normal rate.     Pulses: Normal pulses.  Pulmonary:     Effort: Pulmonary effort is normal. No respiratory distress.  Abdominal:     General: There is no distension.     Palpations: Abdomen is soft.  Musculoskeletal:        General: No tenderness or signs of injury.     Cervical back: Neck supple.     Right lower leg:  No edema.     Left lower leg: No edema.     Comments: Patient has good distal strength without clonus.  Skin:    Findings: No erythema or rash.  Neurological:     General: No focal deficit present.     Mental Status: He is alert and oriented to person, place, and time.     Sensory: No sensory deficit.     Motor: No weakness or abnormal muscle tone.     Coordination: Coordination normal.  Psychiatric:        Mood and Affect: Mood normal.        Behavior: Behavior normal.      Imaging: No results found.

## 2024-07-07 NOTE — Procedures (Signed)
 Lumbar Facet Joint Nerve Denervation  Patient: Adam Barrera      Date of Birth: 09/08/1951 MRN: [id number] PCP: Lavell Bari LABOR, FNP      Visit Date: 06/30/2024   Universal Protocol:    Date/Time: 10/13/256:10 AM  Consent Given By: the patient  Position: PRONE  Additional Comments: Vital signs were monitored before and after the procedure. Patient was prepped and draped in the usual sterile fashion. The correct patient, procedure, and site was verified.   Injection Procedure Details:   Procedure diagnoses:  1. Spondylosis without myelopathy or radiculopathy, lumbar region      Meds Administered:  Meds ordered this encounter  Medications   methylPREDNISolone  acetate (DEPO-MEDROL ) injection 40 mg     Laterality: Left  Location/Site:  L4-L5, L3 and L4 medial branches and L5-S1, L4 medial branch and L5 dorsal ramus  Needle: 18 ga.,  10mm active tip, RF Cannula  Needle Placement: Along juncture of superior articular process and transverse pocess  Findings:  -Comments:  Procedure Details: For each desired target nerve, the corresponding transverse process (sacral ala for the L5 dorsal rami) was identified and the fluoroscope was positioned to square off the endplates of the corresponding vertebral body to achieve a true AP midline view.  The beam was then obliqued 15 to 20 degrees and caudally tilted 15 to 20 degrees to line up a trajectory along the target nerves. The skin over the target of the junction of superior articulating process and transverse process (sacral ala for the L5 dorsal rami) was infiltrated with 1ml of 1% Lidocaine  without Epinephrine .  The 18 gauge 10mm active tip outer cannula was advanced in trajectory view to the target.  This procedure was repeated for each target nerve.  Then, for all levels, the outer cannula placement was fine-tuned and the position was then confirmed with bi-planar imaging.    Test stimulation was done both at sensory and  motor levels to ensure there was no radicular stimulation. The target tissues were then infiltrated with 1 ml of 1% Lidocaine  without Epinephrine . Subsequently, a percutaneous neurotomy was carried out for 90 seconds at 80 degrees Celsius.  After the completion of the lesion, 1 ml of injectate was delivered. It was then repeated for each facet joint nerve mentioned above. Appropriate radiographs were obtained to verify the probe placement during the neurotomy.   Additional Comments:  No complications occurred Dressing: 2 x 2 sterile gauze and Band-Aid    Post-procedure details: Patient was observed during the procedure. Post-procedure instructions were reviewed.  Patient left the clinic in stable condition.

## 2024-07-08 ENCOUNTER — Ambulatory Visit: Admitting: Physical Medicine and Rehabilitation

## 2024-07-08 DIAGNOSIS — M79641 Pain in right hand: Secondary | ICD-10-CM

## 2024-07-08 DIAGNOSIS — R202 Paresthesia of skin: Secondary | ICD-10-CM

## 2024-07-08 DIAGNOSIS — R29898 Other symptoms and signs involving the musculoskeletal system: Secondary | ICD-10-CM

## 2024-07-08 NOTE — Progress Notes (Signed)
 Pain Scale   Average Pain 0 Patient advising he has numbness/ tingling and some weakness in his Right hand. Patient is Right hand Dominate         +Driver, -BT, -Dye Allergies.

## 2024-07-10 NOTE — Procedures (Signed)
 EMG & NCV Findings: Evaluation of the right median motor nerve showed prolonged distal onset latency (5.2 ms), reduced amplitude (2.9 mV), and decreased conduction velocity (Elbow-Wrist, 42 m/s).  The right ulnar motor nerve showed reduced amplitude (2.8 mV).  The right median (across palm) sensory nerve showed no response (Palm), prolonged distal peak latency (4.9 ms), and reduced amplitude (8.9 V).  The right ulnar sensory nerve showed prolonged distal peak latency (7.3 ms), reduced amplitude (9.4 V), and decreased conduction velocity (Wrist-5th Digit, 19 m/s).  All remaining nerves (as indicated in the following tables) were within normal limits.    All examined muscles (as indicated in the following table) showed no evidence of electrical instability.    Impression: The above electrodiagnostic study is ABNORMAL and reveals evidence of a moderate to severe right median nerve entrapment at the wrist (carpal tunnel syndrome) affecting sensory and motor components.   The above electrodiagnostic study is not diagnostic but suggestive of underlying peripheral neuropathy.  There is no significant electrodiagnostic evidence of any other focal nerve entrapment, brachial plexopathy or cervical radiculopathy.   Recommendations: 1.  Follow-up with referring physician. 2.  Continue current management of symptoms. 3.  Continue use of resting splint at night-time and as needed during the day. 4.  Suggest surgical evaluation.  ___________________________ Prentice Masters FAAPMR Board Certified, American Board of Physical Medicine and Rehabilitation    Nerve Conduction Studies Anti Sensory Summary Table   Stim Site NR Peak (ms) Norm Peak (ms) P-T Amp (V) Norm P-T Amp Site1 Site2 Delta-P (ms) Dist (cm) Vel (m/s) Norm Vel (m/s)  Right Median Acr Palm Anti Sensory (2nd Digit)  30.9C  Wrist    *4.9 <3.6 *8.9 >10 Wrist Palm  0.0    Palm *NR  <2.0          Right Radial Anti Sensory (Base 1st Digit)   30.9C  Wrist    2.1 <3.1 9.4  Wrist Base 1st Digit 2.1 0.0    Right Ulnar Anti Sensory (5th Digit)  31.6C  Wrist    *7.3 <3.7 *9.4 >15.0 Wrist 5th Digit 7.3 14.0 *19 >38   Motor Summary Table   Stim Site NR Onset (ms) Norm Onset (ms) O-P Amp (mV) Norm O-P Amp Site1 Site2 Delta-0 (ms) Dist (cm) Vel (m/s) Norm Vel (m/s)  Right Median Motor (Abd Poll Brev)  31.2C  Wrist    *5.2 <4.2 *2.9 >5 Elbow Wrist 6.1 25.5 *42 >50  Elbow    11.3  1.3         Right Ulnar Motor (Abd Dig Min)  31.7C  Wrist    4.1 <4.2 *2.8 >3 B Elbow Wrist 4.5 24.0 53 >53  B Elbow    8.6  1.8  A Elbow B Elbow 1.7 11.0 65 >53  A Elbow    10.3  5.1          EMG   Side Muscle Nerve Root Ins Act Fibs Psw Amp Dur Poly Recrt Int Bruna Comment  Right Abd Poll Brev Median C8-T1 Nml Nml Nml Nml Nml 0 Nml Nml   Right 1stDorInt Ulnar C8-T1 Nml Nml Nml Nml Nml 0 Nml Nml   Right PronatorTeres Median C6-7 Nml Nml Nml Nml Nml 0 Nml Nml   Right Biceps Musculocut C5-6 Nml Nml Nml Nml Nml 0 Nml Nml   Right Deltoid Axillary C5-6 Nml Nml Nml Nml Nml 0 Nml Nml     Nerve Conduction Studies Anti Sensory Left/Right Comparison   Stim  Site L Lat (ms) R Lat (ms) L-R Lat (ms) L Amp (V) R Amp (V) L-R Amp (%) Site1 Site2 L Vel (m/s) R Vel (m/s) L-R Vel (m/s)  Median Acr Palm Anti Sensory (2nd Digit)  30.9C  Wrist  *4.9   *8.9  Wrist Palm     Palm             Radial Anti Sensory (Base 1st Digit)  30.9C  Wrist  2.1   9.4  Wrist Base 1st Digit     Ulnar Anti Sensory (5th Digit)  31.6C  Wrist  *7.3   *9.4  Wrist 5th Digit  *19    Motor Left/Right Comparison   Stim Site L Lat (ms) R Lat (ms) L-R Lat (ms) L Amp (mV) R Amp (mV) L-R Amp (%) Site1 Site2 L Vel (m/s) R Vel (m/s) L-R Vel (m/s)  Median Motor (Abd Poll Brev)  31.2C  Wrist  *5.2   *2.9  Elbow Wrist  *42   Elbow  11.3   1.3        Ulnar Motor (Abd Dig Min)  31.7C  Wrist  4.1   *2.8  B Elbow Wrist  53   B Elbow  8.6   1.8  A Elbow B Elbow  65   A Elbow  10.3   5.1            Waveforms:

## 2024-07-15 NOTE — Progress Notes (Signed)
 Adam Barrera - 73 y.o. male MRN [id number]  Date of birth: [birth year]  Office Visit Note: Visit Date: 07/08/2024 PCP: Lavell Bari LABOR, FNP Referred by: Lavell Bari LABOR, FNP  Subjective: Chief Complaint  Patient presents with   Right Hand - Numbness, Weakness   HPI:  Adam Barrera is a 73 y.o. male who comes in today at the request of Duwaine Pouch, FNP for evaluation and management of chronic, worsening and severe pain, numbness and tingling in the Right upper extremities.  Patient is Right hand dominant.  He is well-known to our office with history of lumbar spine issues mostly facet arthropathy that is been amenable to radiofrequency ablation.  When we were seeing her recently was having more numbness and tingling and weakness in the right hand and we felt like this could be a more of an issue with carpal tunnel syndrome.  He does have more radial digit numbness and tingling.  He does endorse some weakness.  His case is complicated by type 2 diabetes with pretty well-controlled hemoglobin A1c's between 6.4 and 7.2.  Does have some history of possible polyneuropathy.  No symptoms in the left hand.  Does endorse some neck pain but no frank radicular symptoms down the arms.  From an orthopedic standpoint he has seen Dr. Addie in the office.    Review of Systems  Musculoskeletal:  Positive for joint pain.  Neurological:  Positive for tingling and focal weakness.  All other systems reviewed and are negative.  Otherwise per HPI.  Assessment & Plan: Visit Diagnoses:    ICD-10-CM   1. Paresthesia of skin  R20.2 NCV with EMG (electromyography)    2. Pain in right hand  M79.641     3. Right hand weakness  R29.898       Plan: Impression: The above electrodiagnostic study is ABNORMAL and reveals evidence of a moderate to severe right median nerve entrapment at the wrist (carpal tunnel syndrome) affecting sensory and motor components.   The above electrodiagnostic study is not diagnostic  but suggestive of underlying peripheral neuropathy.  There is no significant electrodiagnostic evidence of any other focal nerve entrapment, brachial plexopathy or cervical radiculopathy.   Recommendations: 1.  Follow-up with referring physician. 2.  Continue current management of symptoms. 3.  Continue use of resting splint at night-time and as needed during the day. 4.  Suggest surgical evaluation.  Meds & Orders: No orders of the defined types were placed in this encounter.   Orders Placed This Encounter  Procedures   NCV with EMG (electromyography)    Follow-up: Return for Duwaine Pouch, FNP.   Procedures: No procedures performed  EMG & NCV Findings: Evaluation of the right median motor nerve showed prolonged distal onset latency (5.2 ms), reduced amplitude (2.9 mV), and decreased conduction velocity (Elbow-Wrist, 42 m/s).  The right ulnar motor nerve showed reduced amplitude (2.8 mV).  The right median (across palm) sensory nerve showed no response (Palm), prolonged distal peak latency (4.9 ms), and reduced amplitude (8.9 V).  The right ulnar sensory nerve showed prolonged distal peak latency (7.3 ms), reduced amplitude (9.4 V), and decreased conduction velocity (Wrist-5th Digit, 19 m/s).  All remaining nerves (as indicated in the following tables) were within normal limits.    All examined muscles (as indicated in the following table) showed no evidence of electrical instability.    Impression: The above electrodiagnostic study is ABNORMAL and reveals evidence of a moderate to severe right median nerve entrapment at  the wrist (carpal tunnel syndrome) affecting sensory and motor components.   The above electrodiagnostic study is not diagnostic but suggestive of underlying peripheral neuropathy.  There is no significant electrodiagnostic evidence of any other focal nerve entrapment, brachial plexopathy or cervical radiculopathy.   Recommendations: 1.  Follow-up with referring  physician. 2.  Continue current management of symptoms. 3.  Continue use of resting splint at night-time and as needed during the day. 4.  Suggest surgical evaluation.  ___________________________ Adam Barrera FAAPMR Board Certified, American Board of Physical Medicine and Rehabilitation    Nerve Conduction Studies Anti Sensory Summary Table   Stim Site NR Peak (ms) Norm Peak (ms) P-T Amp (V) Norm P-T Amp Site1 Site2 Delta-P (ms) Dist (cm) Vel (m/s) Norm Vel (m/s)  Right Median Acr Palm Anti Sensory (2nd Digit)  30.9C  Wrist    *4.9 <3.6 *8.9 >10 Wrist Palm  0.0    Palm *NR  <2.0          Right Radial Anti Sensory (Base 1st Digit)  30.9C  Wrist    2.1 <3.1 9.4  Wrist Base 1st Digit 2.1 0.0    Right Ulnar Anti Sensory (5th Digit)  31.6C  Wrist    *7.3 <3.7 *9.4 >15.0 Wrist 5th Digit 7.3 14.0 *19 >38   Motor Summary Table   Stim Site NR Onset (ms) Norm Onset (ms) O-P Amp (mV) Norm O-P Amp Site1 Site2 Delta-0 (ms) Dist (cm) Vel (m/s) Norm Vel (m/s)  Right Median Motor (Abd Poll Brev)  31.2C  Wrist    *5.2 <4.2 *2.9 >5 Elbow Wrist 6.1 25.5 *42 >50  Elbow    11.3  1.3         Right Ulnar Motor (Abd Dig Min)  31.7C  Wrist    4.1 <4.2 *2.8 >3 B Elbow Wrist 4.5 24.0 53 >53  B Elbow    8.6  1.8  A Elbow B Elbow 1.7 11.0 65 >53  A Elbow    10.3  5.1          EMG   Side Muscle Nerve Root Ins Act Fibs Psw Amp Dur Poly Recrt Int Bruna Comment  Right Abd Poll Brev Median C8-T1 Nml Nml Nml Nml Nml 0 Nml Nml   Right 1stDorInt Ulnar C8-T1 Nml Nml Nml Nml Nml 0 Nml Nml   Right PronatorTeres Median C6-7 Nml Nml Nml Nml Nml 0 Nml Nml   Right Biceps Musculocut C5-6 Nml Nml Nml Nml Nml 0 Nml Nml   Right Deltoid Axillary C5-6 Nml Nml Nml Nml Nml 0 Nml Nml     Nerve Conduction Studies Anti Sensory Left/Right Comparison   Stim Site L Lat (ms) R Lat (ms) L-R Lat (ms) L Amp (V) R Amp (V) L-R Amp (%) Site1 Site2 L Vel (m/s) R Vel (m/s) L-R Vel (m/s)  Median Acr Palm Anti Sensory (2nd Digit)   30.9C  Wrist  *4.9   *8.9  Wrist Palm     Palm             Radial Anti Sensory (Base 1st Digit)  30.9C  Wrist  2.1   9.4  Wrist Base 1st Digit     Ulnar Anti Sensory (5th Digit)  31.6C  Wrist  *7.3   *9.4  Wrist 5th Digit  *19    Motor Left/Right Comparison   Stim Site L Lat (ms) R Lat (ms) L-R Lat (ms) L Amp (mV) R Amp (mV) L-R Amp (%) Site1 Site2 L  Vel (m/s) R Vel (m/s) L-R Vel (m/s)  Median Motor (Abd Poll Brev)  31.2C  Wrist  *5.2   *2.9  Elbow Wrist  *42   Elbow  11.3   1.3        Ulnar Motor (Abd Dig Min)  31.7C  Wrist  4.1   *2.8  B Elbow Wrist  53   B Elbow  8.6   1.8  A Elbow B Elbow  65   A Elbow  10.3   5.1           Waveforms:            Clinical History: EXAM: MRI LUMBAR SPINE WITHOUT CONTRAST   TECHNIQUE: Multiplanar, multisequence MR imaging of the lumbar spine was performed. No intravenous contrast was administered.   COMPARISON:  None Available.   FINDINGS: Segmentation:  Standard.   Alignment:  Trace anterolisthesis of L4 on L5.   Vertebrae: No fracture, evidence of discitis, or bone lesion. Scattered degenerative endplate marrow changes, including a Schmorl's node of the L4 level.   Conus medullaris and cauda equina: Conus extends to the L2 level. Conus and cauda equina appear normal. There is a fatty filum terminale.   Paraspinal and other soft tissues: Small bilateral T2 hyperintense renal lesions are favored to represent renal cysts. No further follow-up is recommended. There is mild atrophy of the paraspinal musculature.   Disc levels:   T11-T12: Moderate bilateral facet degenerative change. No spinal canal stenosis. No neural foraminal stenosis.   T12-L1: Moderate bilateral facet degenerative change. No spinal canal stenosis. No neural foraminal stenosis.   L1-L2: Moderate left and mild right facet degenerative change. No spinal canal stenosis. No neural foraminal stenosis.   L2-L3: Moderate bilateral facet degenerative  change. No spinal canal stenosis. Mild bilateral neural foraminal stenosis.   L3-L4: Moderate bilateral facet degenerative change. Ligamentum flavum hypertrophy. Circumferential disc bulge. Mild spinal canal stenosis. Mild-to-moderate bilateral neural foraminal stenosis.   L4-L5: Severe bilateral facet degenerative change. Ligamentum flavum hypertrophy. Circumferential disc bulge. Severe spinal canal stenosis. Moderate to severe bilateral neural foraminal stenosis. There is redundancy of the cauda equina nerve roots above this level.   L5-S1: Moderate bilateral facet degenerative change. Circumferential disc bulge. Mild spinal canal stenosis. Moderate bilateral neural foraminal stenosis.   IMPRESSION: 1. Multilevel degenerative changes of the lumbar spine, worst at L4-L5 where there is severe spinal canal stenosis and moderate to severe bilateral neural foraminal stenosis. There is redundancy of the cauda equina nerve roots above this level, suggestive of high-grade spinal canal stenosis. 2. Additional degenerative changes, as above.     Electronically Signed   By: Lyndall Gore M.D.   On: 07/19/2022 10:23     Objective:  VS:  HT:    WT:   BMI:     BP:   HR: bpm  TEMP: ( )  RESP:  Physical Exam Musculoskeletal:        General: Tenderness present.     Comments: Inspection reveals flattening of the right APB but no atrophy of the bilateral FDI or hand intrinsics.  Distal amputation left radial digit.  There is no swelling, color changes, allodynia or dystrophic changes. There is 5 out of 5 strength in the bilateral wrist extension, finger abduction and long finger flexion. There is intact sensation to light touch in all dermatomal and peripheral nerve distributions. There is a negative Tinel's test at the wrist and elbow. There is a positive Phalen's test on the right. There  is a negative Hoffmann's test bilaterally.  Skin:    General: Skin is warm and dry.     Findings: No  erythema or rash.  Neurological:     General: No focal deficit present.     Mental Status: He is alert and oriented to person, place, and time.     Cranial Nerves: No cranial nerve deficit.     Sensory: No sensory deficit.     Motor: No weakness or abnormal muscle tone.     Coordination: Coordination normal.     Gait: Gait normal.  Psychiatric:        Mood and Affect: Mood normal.        Behavior: Behavior normal.        Thought Content: Thought content normal.      Imaging: No results found.

## 2024-07-28 ENCOUNTER — Encounter: Payer: Self-pay | Admitting: Radiology

## 2024-08-05 ENCOUNTER — Encounter: Payer: Self-pay | Admitting: Family

## 2024-08-05 ENCOUNTER — Ambulatory Visit (INDEPENDENT_AMBULATORY_CARE_PROVIDER_SITE_OTHER): Payer: Self-pay | Admitting: Family

## 2024-08-05 VITALS — BP 139/77 | HR 85 | Temp 97.5°F | Ht 70.0 in | Wt 281.0 lb

## 2024-08-05 DIAGNOSIS — Z7984 Long term (current) use of oral hypoglycemic drugs: Secondary | ICD-10-CM

## 2024-08-05 DIAGNOSIS — Z23 Encounter for immunization: Secondary | ICD-10-CM | POA: Diagnosis not present

## 2024-08-05 DIAGNOSIS — J301 Allergic rhinitis due to pollen: Secondary | ICD-10-CM

## 2024-08-05 DIAGNOSIS — J410 Simple chronic bronchitis: Secondary | ICD-10-CM

## 2024-08-05 DIAGNOSIS — E785 Hyperlipidemia, unspecified: Secondary | ICD-10-CM | POA: Diagnosis not present

## 2024-08-05 DIAGNOSIS — E1169 Type 2 diabetes mellitus with other specified complication: Secondary | ICD-10-CM | POA: Diagnosis not present

## 2024-08-05 DIAGNOSIS — E1159 Type 2 diabetes mellitus with other circulatory complications: Secondary | ICD-10-CM | POA: Diagnosis not present

## 2024-08-05 DIAGNOSIS — I152 Hypertension secondary to endocrine disorders: Secondary | ICD-10-CM

## 2024-08-05 DIAGNOSIS — N3281 Overactive bladder: Secondary | ICD-10-CM

## 2024-08-05 DIAGNOSIS — I700 Atherosclerosis of aorta: Secondary | ICD-10-CM

## 2024-08-05 DIAGNOSIS — K760 Fatty (change of) liver, not elsewhere classified: Secondary | ICD-10-CM | POA: Diagnosis not present

## 2024-08-05 DIAGNOSIS — Z87891 Personal history of nicotine dependence: Secondary | ICD-10-CM | POA: Diagnosis not present

## 2024-08-05 DIAGNOSIS — J449 Chronic obstructive pulmonary disease, unspecified: Secondary | ICD-10-CM

## 2024-08-05 DIAGNOSIS — R062 Wheezing: Secondary | ICD-10-CM | POA: Diagnosis not present

## 2024-08-05 DIAGNOSIS — J61 Pneumoconiosis due to asbestos and other mineral fibers: Secondary | ICD-10-CM

## 2024-08-05 LAB — BAYER DCA HB A1C WAIVED: HB A1C (BAYER DCA - WAIVED): 6 % — ABNORMAL HIGH (ref 4.8–5.6)

## 2024-08-05 MED ORDER — METFORMIN HCL 1000 MG PO TABS: 1000.0000 mg | ORAL_TABLET | Freq: Two times a day (BID) | ORAL | 1 refills | Status: AC

## 2024-08-05 MED ORDER — ALBUTEROL SULFATE HFA 108 (90 BASE) MCG/ACT IN AERS: INHALATION_SPRAY | RESPIRATORY_TRACT | 2 refills | Status: AC

## 2024-08-05 MED ORDER — ROSUVASTATIN CALCIUM 20 MG PO TABS: 20.0000 mg | ORAL_TABLET | Freq: Every day | ORAL | 1 refills | Status: AC

## 2024-08-05 MED ORDER — OXYBUTYNIN CHLORIDE ER 15 MG PO TB24: 15.0000 mg | ORAL_TABLET | Freq: Every day | ORAL | 2 refills | Status: AC

## 2024-08-05 MED ORDER — CETIRIZINE HCL 10 MG PO TABS: 10.0000 mg | ORAL_TABLET | Freq: Every day | ORAL | 1 refills | Status: AC

## 2024-08-05 NOTE — Progress Notes (Signed)
 Subjective:    Patient ID: Adam Barrera, male    DOB: 1951-04-08, 73 y.o.   MRN: [id number]  Chief Complaint  Patient presents with   Medical Management of Chronic Issues   Pt presents to the office today for chronic follow up.    He was followed by Pulmonologist for asbestosis and pulmonary nodules,  but has not seen them in several years.    Has CKD and tries to avoid NSAID's.    He is morbid obese with a BMI of 40 and DM and HTN.   Has aortic atherosclerosis and takes Crestor  daily.  Complaining of numbness in bilateral elbows, left thumb, bilateral thighs, and back at times that comes and goes. He saw a hand surgeon who told him he had carpal tunnel, but advised to wait on surgery.   He is followed by Neurosurgeon for chronic back pain as needed.   Diabetes He presents for his follow-up diabetic visit. He has type 2 diabetes mellitus. Associated symptoms include foot paresthesias. Pertinent negatives for diabetes include no blurred vision. Symptoms are stable. Diabetic complications include nephropathy and peripheral neuropathy. Risk factors for coronary artery disease include dyslipidemia, diabetes mellitus, hypertension, sedentary lifestyle, male sex and obesity. He is following a generally unhealthy diet. His overall blood glucose range is 110-130 mg/dl. (Does not check glucose regularly) Eye exam is current.  Hypertension This is a chronic problem. The current episode started more than 1 year ago. The problem has been resolved since onset. The problem is controlled. Associated symptoms include peripheral edema. Pertinent negatives include no blurred vision, malaise/fatigue or shortness of breath. Risk factors for coronary artery disease include dyslipidemia, male gender, sedentary lifestyle and obesity. The current treatment provides moderate improvement.  Hyperlipidemia This is a chronic problem. The current episode started more than 1 year ago. The problem is controlled. Recent  lipid tests were reviewed and are normal. Exacerbating diseases include obesity. Pertinent negatives include no shortness of breath. Current antihyperlipidemic treatment includes statins. The current treatment provides moderate improvement of lipids. Risk factors for coronary artery disease include dyslipidemia, diabetes mellitus, hypertension, male sex, a sedentary lifestyle and obesity.  Arthritis Presents for follow-up visit. He complains of pain and stiffness. Affected locations include the left knee and right knee (back). His pain is at a severity of 5/10.  Urinary Frequency  This is a chronic problem. The current episode started more than 1 year ago. The patient is experiencing no pain. Associated symptoms include frequency and urgency. Treatments tried: oxybutynin . The treatment provided moderate relief.  Benign Prostatic Hypertrophy This is a chronic problem. The current episode started more than 1 year ago. Irritative symptoms include frequency, nocturia (3) and urgency. Past treatments include tamsulosin . The treatment provided moderate relief.      Review of Systems  Constitutional:  Negative for malaise/fatigue.  Eyes:  Negative for blurred vision.  Respiratory:  Negative for shortness of breath.   Genitourinary:  Positive for frequency, nocturia (3) and urgency.  Musculoskeletal:  Positive for stiffness.  All other systems reviewed and are negative.  Family History  Problem Relation Age of Onset   Prostate cancer Other        family history    Prostate cancer Father    Colon cancer Father    Cancer Father    Hypertension Father    Diabetes Mother    Stroke Mother    Alzheimer's disease Mother    Hypertension Mother    Stomach cancer Brother 32  Cancer Brother    Colon cancer Brother 73    Social History   Socioeconomic History   Marital status: Married    Spouse name: Mercer   Number of children: 2   Years of education: 14   Highest education level: Associate  degree: occupational, scientist, product/process development, or vocational program  Occupational History   Occupation: Retired from [employer]  Tobacco Use   Smoking status: Former    Current packs/day: 0.00    Average packs/day: 1 pack/day for 41.0 years (41.0 ttl pk-yrs)    Types: Cigarettes    Start date: 07/28/1974    Quit date: 07/29/2015    Years since quitting: 9.0   Smokeless tobacco: Never  Vaping Use   Vaping status: Never Used  Substance and Sexual Activity   Alcohol use: No   Drug use: No   Sexual activity: Yes    Birth control/protection: None  Other Topics Concern   Not on file  Social History Narrative   Lives in 2 story home with wife - daughter lives with them   Involved in church - enjoys playing golf 2-3 times per week   Social Drivers of Health   Financial Resource Strain: Low Risk  (08/29/2023)   Overall Financial Resource Strain (CARDIA)    Difficulty of Paying Living Expenses: Not hard at all  Food Insecurity: No Food Insecurity (08/29/2023)   Hunger Vital Sign    Worried About Running Out of Food in the Last Year: Never true    Ran Out of Food in the Last Year: Never true  Transportation Needs: No Transportation Needs (08/29/2023)   PRAPARE - Administrator, Civil Service (Medical): No    Lack of Transportation (Non-Medical): No  Physical Activity: Insufficiently Active (08/29/2023)   Exercise Vital Sign    Days of Exercise per Week: 3 days    Minutes of Exercise per Session: 30 min  Stress: No Stress Concern Present (08/29/2023)   [employer] of Occupational Health - Occupational Stress Questionnaire    Feeling of Stress : Not at all  Social Connections: Socially Integrated (08/29/2023)   Social Connection and Isolation Panel    Frequency of Communication with Friends and Family: More than three times a week    Frequency of Social Gatherings with Friends and Family: Three times a week    Attends Religious Services: More than 4 times per year    Active Member  of Clubs or Organizations: Yes    Attends Banker Meetings: More than 4 times per year    Marital Status: Married       Objective:   Physical Exam Vitals reviewed.  Constitutional:      General: He is not in acute distress.    Appearance: He is well-developed. He is obese.  HENT:     Head: Normocephalic.     Right Ear: Tympanic membrane normal.     Left Ear: Tympanic membrane normal.  Eyes:     General:        Right eye: No discharge.        Left eye: No discharge.     Pupils: Pupils are equal, round, and reactive to light.  Neck:     Thyroid : No thyromegaly.  Cardiovascular:     Rate and Rhythm: Normal rate and regular rhythm.     Heart sounds: Normal heart sounds. No murmur heard. Pulmonary:     Effort: Pulmonary effort is normal. No respiratory distress.  Breath sounds: Normal breath sounds. No wheezing.  Abdominal:     General: Bowel sounds are normal. There is no distension.     Palpations: Abdomen is soft.     Tenderness: There is no abdominal tenderness.  Musculoskeletal:        General: No tenderness. Normal range of motion.     Cervical back: Normal range of motion and neck supple.     Right lower leg: Edema (trace) present.     Left lower leg: Edema (trace) present.     Comments: discoloration of left lower leg  Skin:    General: Skin is warm and dry.     Findings: No erythema or rash.  Neurological:     Mental Status: He is alert and oriented to person, place, and time.     Cranial Nerves: No cranial nerve deficit.     Deep Tendon Reflexes: Reflexes are normal and symmetric.  Psychiatric:        Behavior: Behavior normal.        Thought Content: Thought content normal.        Judgment: Judgment normal.      BP 139/77   Pulse 85   Temp (!) 97.5 F (36.4 C) (Temporal)   Ht 5' 10 (1.778 m)   Wt 281 lb (127.5 kg)   SpO2 95%   BMI 40.32 kg/m      Assessment & Plan:  Audric Venn Armijo comes in today with chief complaint of Medical  Management of Chronic Issues   Diagnosis and orders addressed:  1. Chronic obstructive pulmonary disease, unspecified COPD type (HCC) - albuterol  (VENTOLIN  HFA) 108 (90 Base) MCG/ACT inhaler; TAKE 2 PUFFS BY MOUTH EVERY 6 HOURS AS NEEDED FOR WHEEZE OR SHORTNESS OF BREATH  Dispense: 18 g; Refill: 2 - CMP14+EGFR  2. Wheezing - albuterol  (VENTOLIN  HFA) 108 (90 Base) MCG/ACT inhaler; TAKE 2 PUFFS BY MOUTH EVERY 6 HOURS AS NEEDED FOR WHEEZE OR SHORTNESS OF BREATH  Dispense: 18 g; Refill: 2 - CMP14+EGFR  3. Allergic rhinitis due to pollen, unspecified seasonality - cetirizine  (ZYRTEC ) 10 MG tablet; Take 1 tablet (10 mg total) by mouth daily.  Dispense: 90 tablet; Refill: 1 - CMP14+EGFR  4. Type 2 diabetes mellitus with other specified complication, without long-term current use of insulin (HCC) (Primary) - metFORMIN  (GLUCOPHAGE ) 1000 MG tablet; Take 1 tablet (1,000 mg total) by mouth 2 (two) times daily.  Dispense: 180 tablet; Refill: 1 - CMP14+EGFR - Bayer DCA Hb A1c Waived  5. Aortic atherosclerosis - rosuvastatin  (CRESTOR ) 20 MG tablet; Take 1 tablet (20 mg total) by mouth daily.  Dispense: 90 tablet; Refill: 1 - CMP14+EGFR  6. Hyperlipidemia associated with type 2 diabetes mellitus (HCC) - rosuvastatin  (CRESTOR ) 20 MG tablet; Take 1 tablet (20 mg total) by mouth daily.  Dispense: 90 tablet; Refill: 1 - CMP14+EGFR  7. Simple chronic bronchitis (HCC) - CMP14+EGFR  8. Hepatic steatosis - CMP14+EGFR  9. Former smoker - CMP14+EGFR  10. Hypertension associated with diabetes (HCC) - CMP14+EGFR  11. Morbid obesity (HCC) - CMP14+EGFR  12. Asbestosis (HCC) - CMP14+EGFR  13. Encounter for immunization - Flu vaccine HIGH DOSE PF(Fluzone Trivalent)  14. Overactive bladder - oxybutynin  (DITROPAN  XL) 15 MG 24 hr tablet; Take 1 tablet (15 mg total) by mouth at bedtime.  Dispense: 90 tablet; Refill: 2   Labs pending Continue current medications  Keep follow up with specialists   Health Maintenance reviewed Diet and exercise encouraged  Follow up plan: 4 month  Bari Learn, FNP

## 2024-08-05 NOTE — Patient Instructions (Signed)

## 2024-08-06 LAB — CMP14+EGFR
ALT: 11 IU/L (ref 0–44)
AST: 13 IU/L (ref 0–40)
Albumin: 4.1 g/dL (ref 3.8–4.8)
Alkaline Phosphatase: 67 IU/L (ref 47–123)
BUN/Creatinine Ratio: 7 — ABNORMAL LOW (ref 10–24)
BUN: 11 mg/dL (ref 8–27)
Bilirubin Total: 1 mg/dL (ref 0.0–1.2)
CO2: 24 mmol/L (ref 20–29)
Calcium: 9.4 mg/dL (ref 8.6–10.2)
Chloride: 103 mmol/L (ref 96–106)
Creatinine, Ser: 1.51 mg/dL — ABNORMAL HIGH (ref 0.76–1.27)
Globulin, Total: 2.3 g/dL (ref 1.5–4.5)
Glucose: 194 mg/dL — ABNORMAL HIGH (ref 70–99)
Potassium: 4.7 mmol/L (ref 3.5–5.2)
Sodium: 141 mmol/L (ref 134–144)
Total Protein: 6.4 g/dL (ref 6.0–8.5)
eGFR: 49 mL/min/1.73 — ABNORMAL LOW (ref >59)

## 2024-08-07 ENCOUNTER — Ambulatory Visit: Payer: Self-pay | Admitting: Family

## 2024-08-18 ENCOUNTER — Ambulatory Visit: Admitting: Urology

## 2024-08-18 DIAGNOSIS — N401 Enlarged prostate with lower urinary tract symptoms: Secondary | ICD-10-CM

## 2024-08-29 ENCOUNTER — Ambulatory Visit: Payer: HMO

## 2024-08-29 VITALS — BP 139/77 | HR 85 | Ht 70.0 in | Wt 281.0 lb

## 2024-08-29 DIAGNOSIS — Z0000 Encounter for general adult medical examination without abnormal findings: Secondary | ICD-10-CM | POA: Diagnosis not present
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
## 2024-10-06 ENCOUNTER — Telehealth: Payer: Self-pay | Admitting: Physical Medicine and Rehabilitation

## 2024-10-06 NOTE — Telephone Encounter (Signed)
 Pt called requesting an appt with [facility]. Last appt 06/30/24.  Please call pt at [phone].

## 2024-10-07 ENCOUNTER — Ambulatory Visit (INDEPENDENT_AMBULATORY_CARE_PROVIDER_SITE_OTHER): Admitting: Family

## 2024-10-07 ENCOUNTER — Encounter: Payer: Self-pay | Admitting: Family

## 2024-10-07 VITALS — BP 118/70 | HR 79 | Temp 96.6°F | Ht 70.0 in | Wt 275.0 lb

## 2024-10-07 DIAGNOSIS — E1169 Type 2 diabetes mellitus with other specified complication: Secondary | ICD-10-CM | POA: Diagnosis not present

## 2024-10-07 DIAGNOSIS — I700 Atherosclerosis of aorta: Secondary | ICD-10-CM | POA: Diagnosis not present

## 2024-10-07 DIAGNOSIS — I10 Essential (primary) hypertension: Secondary | ICD-10-CM

## 2024-10-07 DIAGNOSIS — J61 Pneumoconiosis due to asbestos and other mineral fibers: Secondary | ICD-10-CM

## 2024-10-07 DIAGNOSIS — I152 Hypertension secondary to endocrine disorders: Secondary | ICD-10-CM | POA: Diagnosis not present

## 2024-10-07 DIAGNOSIS — M170 Bilateral primary osteoarthritis of knee: Secondary | ICD-10-CM

## 2024-10-07 DIAGNOSIS — E66812 Obesity, class 2: Secondary | ICD-10-CM | POA: Diagnosis not present

## 2024-10-07 DIAGNOSIS — E1159 Type 2 diabetes mellitus with other circulatory complications: Secondary | ICD-10-CM

## 2024-10-07 DIAGNOSIS — J410 Simple chronic bronchitis: Secondary | ICD-10-CM

## 2024-10-07 DIAGNOSIS — N183 Chronic kidney disease, stage 3 unspecified: Secondary | ICD-10-CM | POA: Diagnosis not present

## 2024-10-07 DIAGNOSIS — N401 Enlarged prostate with lower urinary tract symptoms: Secondary | ICD-10-CM

## 2024-10-07 DIAGNOSIS — Z6839 Body mass index (BMI) 39.0-39.9, adult: Secondary | ICD-10-CM

## 2024-10-07 MED ORDER — LOSARTAN POTASSIUM 100 MG PO TABS: 100.0000 mg | ORAL_TABLET | Freq: Every day | ORAL | 1 refills | Status: AC

## 2024-10-07 MED ORDER — TAMSULOSIN HCL 0.4 MG PO CAPS: 0.4000 mg | ORAL_CAPSULE | Freq: Every day | ORAL | 1 refills | Status: AC

## 2024-10-07 NOTE — Progress Notes (Signed)
 "  Subjective:    Patient ID: Adam Barrera, male    DOB: [birth year], 74 y.o.   MRN: [id number]  Chief Complaint  Patient presents with   Medical Management of Chronic Issues   Pt presents to the office today for chronic follow up.    He was followed by Pulmonologist for asbestosis and pulmonary nodules,  but has not seen them in several years.    Has CKD and tries to avoid NSAID's.    He is morbid obese with a BMI of 39 and DM and HTN.   Has aortic atherosclerosis and takes Crestor  daily.  Complaining of numbness in bilateral elbows, left thumb, bilateral thighs, and back at times that comes and goes. He saw a hand surgeon who told him he had carpal tunnel, but advised to wait on surgery.   He is followed by Neurosurgeon for chronic back pain and has a follow up 10/09/24. Diabetes He presents for his follow-up diabetic visit. He has type 2 diabetes mellitus. Associated symptoms include foot paresthesias. Pertinent negatives for diabetes include no blurred vision. Symptoms are stable. Diabetic complications include nephropathy and peripheral neuropathy. Risk factors for coronary artery disease include dyslipidemia, diabetes mellitus, hypertension, sedentary lifestyle, male sex and obesity. He is following a generally unhealthy diet. His overall blood glucose range is 110-130 mg/dl. (Does not check glucose regularly) Eye exam is current.  Hypertension This is a chronic problem. The current episode started more than 1 year ago. The problem has been resolved since onset. The problem is controlled. Associated symptoms include malaise/fatigue and peripheral edema. Pertinent negatives include no blurred vision or shortness of breath. Risk factors for coronary artery disease include dyslipidemia, male gender, sedentary lifestyle and obesity. The current treatment provides moderate improvement.  Hyperlipidemia This is a chronic problem. The current episode started more than 1 year ago. The problem  is controlled. Recent lipid tests were reviewed and are normal. Exacerbating diseases include obesity. Associated symptoms include leg pain. Pertinent negatives include no shortness of breath. Current antihyperlipidemic treatment includes statins. The current treatment provides moderate improvement of lipids. Risk factors for coronary artery disease include dyslipidemia, diabetes mellitus, hypertension, male sex, a sedentary lifestyle and obesity.  Arthritis Presents for follow-up visit. He complains of pain and stiffness. Affected locations include the left knee and right knee (back). His pain is at a severity of 5/10.  Urinary Frequency  This is a chronic problem. The current episode started more than 1 year ago. The patient is experiencing no pain. Associated symptoms include frequency and urgency. Treatments tried: oxybutynin . The treatment provided moderate relief.  Benign Prostatic Hypertrophy This is a chronic problem. The current episode started more than 1 year ago. Irritative symptoms include frequency, nocturia (3) and urgency. Past treatments include tamsulosin . The treatment provided moderate relief.  Back Pain This is a chronic problem. The current episode started more than 1 year ago. The problem occurs intermittently. The problem has been waxing and waning since onset. The pain is present in the lumbar spine. The quality of the pain is described as aching. The pain is at a severity of 8/10. The pain is moderate. The symptoms are aggravated by standing and bending. Associated symptoms include leg pain. Risk factors include obesity. He has tried bed rest for the symptoms. The treatment provided mild relief.      Review of Systems  Constitutional:  Positive for malaise/fatigue.  Eyes:  Negative for blurred vision.  Respiratory:  Negative for shortness of breath.  Genitourinary:  Positive for frequency, nocturia (3) and urgency.  Musculoskeletal:  Positive for back pain and stiffness.   All other systems reviewed and are negative.  Family History  Problem Relation Age of Onset   Prostate cancer Other        family history    Prostate cancer Father    Colon cancer Father    Cancer Father    Hypertension Father    Diabetes Mother    Stroke Mother    Alzheimer's disease Mother    Hypertension Mother    Stomach cancer Brother 52   Cancer Brother    Colon cancer Brother 25    Social History   Socioeconomic History   Marital status: Married    Spouse name: Mercer   Number of children: 2   Years of education: 14   Highest education level: Tax Adviser degree: occupational, scientist, product/process development, or vocational program  Occupational History   Occupation: Retired from [employer]  Tobacco Use   Smoking status: Former    Current packs/day: 0.00    Average packs/day: 1 pack/day for 41.0 years (41.0 ttl pk-yrs)    Types: Cigarettes    Start date: 07/28/1974    Quit date: 07/29/2015    Years since quitting: 9.2   Smokeless tobacco: Never  Vaping Use   Vaping status: Never Used  Substance and Sexual Activity   Alcohol use: No   Drug use: No   Sexual activity: Yes    Birth control/protection: None  Other Topics Concern   Not on file  Social History Narrative   Lives in 2 story home with wife - daughter lives with them   Involved in church - enjoys playing golf 2-3 times per week   Social Drivers of Health   Tobacco Use: Medium Risk (10/07/2024)   Patient History    Smoking Tobacco Use: Former    Smokeless Tobacco Use: Never    Passive Exposure: Not on file  Financial Resource Strain: Low Risk (08/29/2023)   Overall Financial Resource Strain (CARDIA)    Difficulty of Paying Living Expenses: Not hard at all  Food Insecurity: No Food Insecurity (08/29/2024)   Epic    Worried About Radiation Protection Practitioner of Food in the Last Year: Never true    Ran Out of Food in the Last Year: Never true  Transportation Needs: No Transportation Needs (08/29/2024)   Epic    Lack of Transportation  (Medical): No    Lack of Transportation (Non-Medical): No  Physical Activity: Insufficiently Active (08/29/2024)   Exercise Vital Sign    Days of Exercise per Week: 3 days    Minutes of Exercise per Session: 30 min  Stress: No Stress Concern Present (08/29/2024)   [employer] of Occupational Health - Occupational Stress Questionnaire    Feeling of Stress: Not at all  Social Connections: Moderately Isolated (08/29/2024)   Social Connection and Isolation Panel    Frequency of Communication with Friends and Family: Once a week    Frequency of Social Gatherings with Friends and Family: Once a week    Attends Religious Services: More than 4 times per year    Active Member of Clubs or Organizations: No    Attends Banker Meetings: Never    Marital Status: Married  Depression (PHQ2-9): Low Risk (10/07/2024)   Depression (PHQ2-9)    PHQ-2 Score: 2  Alcohol Screen: Low Risk (08/29/2023)   Alcohol Screen    Last Alcohol Screening Score (AUDIT): 0  Housing: Unknown (08/29/2024)  Epic    Unable to Pay for Housing in the Last Year: No    Number of Times Moved in the Last Year: Not on file    Homeless in the Last Year: No  Utilities: Not At Risk (08/29/2024)   Epic    Threatened with loss of utilities: No  Health Literacy: Adequate Health Literacy (08/29/2024)   B1300 Health Literacy    Frequency of need for help with medical instructions: Never       Objective:   Physical Exam Vitals reviewed.  Constitutional:      General: He is not in acute distress.    Appearance: He is well-developed. He is obese.  HENT:     Head: Normocephalic.     Right Ear: Tympanic membrane normal.     Left Ear: Tympanic membrane normal.  Eyes:     General:        Right eye: No discharge.        Left eye: No discharge.     Pupils: Pupils are equal, round, and reactive to light.  Neck:     Thyroid : No thyromegaly.  Cardiovascular:     Rate and Rhythm: Normal rate and regular rhythm.      Heart sounds: Normal heart sounds. No murmur heard. Pulmonary:     Effort: Pulmonary effort is normal. No respiratory distress.     Breath sounds: Normal breath sounds. No wheezing.  Abdominal:     General: Bowel sounds are normal. There is no distension.     Palpations: Abdomen is soft.     Tenderness: There is no abdominal tenderness.  Musculoskeletal:        General: No tenderness. Normal range of motion.     Cervical back: Normal range of motion and neck supple.     Right lower leg: Edema (trace) present.     Left lower leg: Edema (trace) present.     Comments: discoloration of left lower leg  Skin:    General: Skin is warm and dry.     Findings: No erythema or rash.  Neurological:     Mental Status: He is alert and oriented to person, place, and time.     Cranial Nerves: No cranial nerve deficit.     Deep Tendon Reflexes: Reflexes are normal and symmetric.  Psychiatric:        Behavior: Behavior normal.        Thought Content: Thought content normal.        Judgment: Judgment normal.      BP 118/70   Pulse 79   Temp (!) 96.6 F (35.9 C) (Temporal)   Ht 5' 10 (1.778 m)   Wt 275 lb (124.7 kg)   SpO2 96%   BMI 39.46 kg/m      Assessment & Plan:  Adam Barrera comes in today with chief complaint of Medical Management of Chronic Issues   Diagnosis and orders addressed:  1. Essential hypertension, benign - losartan  (COZAAR ) 100 MG tablet; Take 1 tablet (100 mg total) by mouth daily.  Dispense: 90 tablet; Refill: 1 - CMP14+EGFR  2. Benign prostatic hyperplasia with urinary frequency - PSA, total and free - tamsulosin  (FLOMAX ) 0.4 MG CAPS capsule; Take 1 capsule (0.4 mg total) by mouth daily.  Dispense: 90 capsule; Refill: 1 - CMP14+EGFR  3. Asbestosis (HCC) - CMP14+EGFR  4. Morbid obesity (HCC)  - CMP14+EGFR  5. Hyperlipidemia associated with type 2 diabetes mellitus (HCC) - CMP14+EGFR  6. Stage 3 chronic kidney  disease, unspecified whether stage 3a or  3b CKD (HCC)  - CMP14+EGFR  7. Simple chronic bronchitis (HCC) (Primary) - CMP14+EGFR  8. Primary osteoarthritis of both knees  - CMP14+EGFR  9. Hypertension associated with diabetes (HCC) - CMP14+EGFR  10. Type 2 diabetes mellitus with other specified complication, without long-term current use of insulin (HCC)  - Vitamin B12 - CMP14+EGFR  11. Aortic atherosclerosis - CMP14+EGFR  Labs pending Continue current medications  Avoid NSAID's, if creatinine continues to be elevated will do referral to nephrologists.  Keep follow up with specialists  Health Maintenance reviewed Diet and exercise encouraged  Follow up plan: 3 month    Bari Learn, FNP    "

## 2024-10-07 NOTE — Patient Instructions (Signed)
 Chronic Kidney Disease: Eating Plan Chronic kidney disease (CKD) is when your kidneys aren't working well. They can't remove waste, fluids, and other substances from your blood. When these substances build up, they can worsen kidney damage and affect your health. Eating certain foods can lead to a buildup of these substances. Changing your diet can help prevent more kidney damage. Diet changes may also delay dialysis or even keep you from needing it. What nutrients should I limit? Work with your health care team and an expert in healthy eating (dietitian) to make a meal plan that's right for you. Foods you can eat and foods you should limit or avoid will depend on: The stage of your kidney disease. Any other conditions you have. The items listed below are not a complete list. Talk with a dietitian to learn what's best for you. Potassium Potassium affects how well your heart beats. Too much potassium in your blood can cause an irregular heartbeat or even a heart attack. You may need to limit foods that are high in potassium, such as: Liquid milk and soy milk. Salt substitutes that contain potassium. Fruits like: Bananas. Apricots. Melon. Prunes and raisins. Kiwi. Nectarines and oranges. Vegetables, such as: Potatoes, sweet potatoes, and yams. Tomatoes. Leafy greens. Beets. Avocado. Pumpkin and winter squash. Beans, like lima beans. Nuts. Phosphorus Phosphorus is a mineral found in your bones. You need a balance between calcium and phosphorus to build and maintain healthy bones.  Too much added phosphorus from the foods you eat can pull calcium from your bones. Losing calcium can make your bones weak and more likely to break. Too much phosphorus can also make your skin itch. You may need to limit foods that are high in phosphorus or that have added phosphorus, such as: Liquid milk and dairy products. Dark-colored sodas or soft drinks. Bran cereals and oatmeal. Protein  Protein  helps your body make and keep muscle. Protein also helps to repair your body's cells and tissues.  One of the natural breakdown products of protein is a waste product called urea. When your kidneys aren't working well, they can't remove waste like urea. Reducing protein in your diet can help keep urea from building up in your blood. Depending on your stage of kidney disease, you may need to eat smaller portions of foods that are high in protein. Sources of animal protein include: Meat (all types). Fish and seafood. Poultry. Eggs. Dairy. Other protein foods include: Beans and legumes. Nuts and nut butter. Soy, like tofu.  Sodium Salt (sodium) helps to keep a healthy balance of fluids in your body. Too much salt can increase your blood pressure, which can harm your heart and lungs. Extra salt can also cause your body to keep too much fluid, making your kidneys work harder. You may need to limit or avoid foods that are high in salt, such as: Salt seasonings. Soy and teriyaki sauce. Meats that are: Packaged. Precooked. Cured. Processed. Salted crackers and snack foods. Fast food. Canned soups and foods. Pickled foods. Boxed mixes or ready-to-eat boxed meals and side dishes. Bottled dressings, sauces, and marinades. Talk with your dietitian about how much potassium, phosphorus, protein, and salt you may have each day. What are tips for following this plan? Reading food labels  Check the amount of salt in foods. Limit foods that have salt listed among the first five ingredients. Try to eat low-salt foods. Check the ingredient list for added phosphorus or potassium. "Phos" in an ingredient is a sign that  phosphorus has been added. Do not buy foods that are calcium-enriched or that have calcium added to them (fortified). Buy canned vegetables and beans that say "no salt added." Rinse them before eating. Lifestyle Limit the amount of protein you eat from animal sources each day. Focus on  protein from plant sources, like tofu and dried beans, peas, and lentils. Do not add salt to food when cooking or before eating. Do not eat star fruit. It can be toxic for people with kidney problems. Talk with your health care provider before taking any vitamin or mineral supplements. If told by your provider: Track how much liquid you have so you can avoid drinking too much. Try to eat foods that are made mostly from water, like gelatin, ice cream, soups, and juicy fruits and vegetables. If you have diabetes and chronic kidney disease: If you have diabetes and CKD, you need to keep your blood sugar (glucose) in the target range recommended by your provider. Follow your diabetes management plan. This may include: Checking your blood glucose regularly. Taking medicines by mouth, or taking insulin, or both. Exercising for at least 30 minutes on 5 or more days each week, or as told by your provider. Tracking how many servings of carbohydrates you eat at each meal. Not using orange juice to treat low blood sugars. Instead, use apple juice, cranberry juice, or clear soda. You may be given guidelines on what foods and nutrients you may eat, and how much you can have each day. This depends on your stage of kidney disease and whether you have high blood pressure. Follow the meal plan your dietitian gives you. Where to find more information [employer] and Digestive and Kidney Diseases: [employer] National Kidney Foundation: kidney.org This information is not intended to replace advice given to you by your health care provider. Make sure you discuss any questions you have with your health care provider. Document Revised: 04/24/2023 Document Reviewed: 04/24/2023 Elsevier Patient Education  2024 [employer].

## 2024-10-08 ENCOUNTER — Telehealth: Payer: Self-pay

## 2024-10-08 LAB — CMP14+EGFR
ALT: 11 IU/L (ref 0–44)
AST: 14 IU/L (ref 0–40)
Albumin: 4.2 g/dL (ref 3.8–4.8)
Alkaline Phosphatase: 78 IU/L (ref 47–123)
BUN/Creatinine Ratio: 8 — ABNORMAL LOW (ref 10–24)
BUN: 10 mg/dL (ref 8–27)
Bilirubin Total: 1 mg/dL (ref 0.0–1.2)
CO2: 24 mmol/L (ref 20–29)
Calcium: 9.2 mg/dL (ref 8.6–10.2)
Chloride: 104 mmol/L (ref 96–106)
Creatinine, Ser: 1.33 mg/dL — ABNORMAL HIGH (ref 0.76–1.27)
Globulin, Total: 2.1 g/dL (ref 1.5–4.5)
Glucose: 257 mg/dL — ABNORMAL HIGH (ref 70–99)
Potassium: 5.2 mmol/L (ref 3.5–5.2)
Sodium: 141 mmol/L (ref 134–144)
Total Protein: 6.3 g/dL (ref 6.0–8.5)
eGFR: 57 mL/min/1.73 — ABNORMAL LOW

## 2024-10-08 LAB — PSA, TOTAL AND FREE
PSA, Free Pct: 58 %
PSA, Free: 0.29 ng/mL
Prostate Specific Ag, Serum: 0.5 ng/mL (ref 0.0–4.0)

## 2024-10-08 LAB — VITAMIN B12: Vitamin B-12: 784 pg/mL (ref 232–1245)

## 2024-10-08 NOTE — Telephone Encounter (Signed)
 I spoke to pt's daughter and she had questions regarding the CKD that was on his AVS. Advised a referral was placed to nephrology for further evaluation.

## 2024-10-08 NOTE — Telephone Encounter (Signed)
 Copied from CRM [id number]. Topic: General - Other >> Oct 08, 2024 11:31 AM Miquel SAILOR wrote: Reason for CRM: PT Daughter Marko requesting call back from 01/13 after summery page for clarification.  [phone]

## 2024-10-09 ENCOUNTER — Ambulatory Visit: Admitting: Physical Medicine and Rehabilitation

## 2024-10-09 ENCOUNTER — Encounter: Payer: Self-pay | Admitting: Physical Medicine and Rehabilitation

## 2024-10-09 ENCOUNTER — Ambulatory Visit: Payer: Self-pay | Admitting: Family

## 2024-10-09 DIAGNOSIS — G5601 Carpal tunnel syndrome, right upper limb: Secondary | ICD-10-CM | POA: Diagnosis not present

## 2024-10-09 DIAGNOSIS — M5441 Lumbago with sciatica, right side: Secondary | ICD-10-CM | POA: Diagnosis not present

## 2024-10-09 DIAGNOSIS — M48062 Spinal stenosis, lumbar region with neurogenic claudication: Secondary | ICD-10-CM

## 2024-10-09 DIAGNOSIS — G8929 Other chronic pain: Secondary | ICD-10-CM

## 2024-10-09 DIAGNOSIS — M5416 Radiculopathy, lumbar region: Secondary | ICD-10-CM
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
## 2024-10-28 ENCOUNTER — Other Ambulatory Visit: Payer: Self-pay

## 2024-10-28 ENCOUNTER — Ambulatory Visit: Admitting: Physical Medicine and Rehabilitation

## 2024-10-28 VITALS — BP 131/83 | HR 86

## 2024-10-28 DIAGNOSIS — M5416 Radiculopathy, lumbar region: Secondary | ICD-10-CM

## 2024-10-28 MED ORDER — METHOCARBAMOL 500 MG PO TABS: 500.0000 mg | ORAL_TABLET | Freq: Three times a day (TID) | ORAL | 0 refills | Status: AC

## 2024-10-28 MED ORDER — METHYLPREDNISOLONE ACETATE 40 MG/ML IJ SUSP
40.0000 mg | Freq: Once | INTRAMUSCULAR | Status: AC
  Administered 2024-10-28: 40 mg

## 2024-11-04 ENCOUNTER — Ambulatory Visit: Payer: Self-pay | Admitting: Family

## 2024-12-09 ENCOUNTER — Ambulatory Visit: Admitting: Family

## 2025-01-06 ENCOUNTER — Ambulatory Visit: Admitting: Family
